# Patient Record
Sex: Female | Born: 1950 | Race: Black or African American | Hispanic: No | Smoking: Former smoker
Health system: Southern US, Community
[De-identification: ages and names within clinical notes are randomized; demographics above are authoritative.]

## PROBLEM LIST (undated history)

## (undated) DIAGNOSIS — R011 Cardiac murmur, unspecified: Secondary | ICD-10-CM

## (undated) DIAGNOSIS — E785 Hyperlipidemia, unspecified: Secondary | ICD-10-CM

## (undated) DIAGNOSIS — R251 Tremor, unspecified: Secondary | ICD-10-CM

## (undated) DIAGNOSIS — I10 Essential (primary) hypertension: Secondary | ICD-10-CM

## (undated) DIAGNOSIS — M47812 Spondylosis without myelopathy or radiculopathy, cervical region: Secondary | ICD-10-CM

## (undated) DIAGNOSIS — F32A Depression, unspecified: Secondary | ICD-10-CM

## (undated) DIAGNOSIS — F329 Major depressive disorder, single episode, unspecified: Secondary | ICD-10-CM

## (undated) DIAGNOSIS — G8929 Other chronic pain: Secondary | ICD-10-CM

## (undated) HISTORY — DX: Cardiac murmur, unspecified: R01.1

## (undated) HISTORY — DX: Depression, unspecified: F32.A

## (undated) HISTORY — DX: Spondylosis without myelopathy or radiculopathy, cervical region: M47.812

## (undated) HISTORY — DX: Major depressive disorder, single episode, unspecified: F32.9

## (undated) HISTORY — DX: Hyperlipidemia, unspecified: E78.5

## (undated) HISTORY — DX: Essential (primary) hypertension: I10

---

## 2002-06-23 ENCOUNTER — Encounter: Payer: Self-pay | Admitting: Emergency Medicine

## 2002-06-23 ENCOUNTER — Emergency Department (HOSPITAL_COMMUNITY): Admission: EM | Admit: 2002-06-23 | Discharge: 2002-06-23 | Payer: Self-pay | Admitting: Emergency Medicine

## 2002-07-03 ENCOUNTER — Encounter: Admission: RE | Admit: 2002-07-03 | Discharge: 2002-10-01 | Payer: Self-pay | Admitting: Sports Medicine

## 2002-10-02 ENCOUNTER — Encounter: Admission: RE | Admit: 2002-10-02 | Discharge: 2002-10-02 | Payer: Self-pay | Admitting: Sports Medicine

## 2004-06-10 ENCOUNTER — Ambulatory Visit: Payer: Self-pay | Admitting: Family Medicine

## 2005-12-26 ENCOUNTER — Other Ambulatory Visit: Admission: RE | Admit: 2005-12-26 | Discharge: 2005-12-26 | Payer: Self-pay | Admitting: Family Medicine

## 2006-09-08 ENCOUNTER — Emergency Department: Payer: Self-pay | Admitting: Emergency Medicine

## 2007-04-08 ENCOUNTER — Other Ambulatory Visit: Admission: RE | Admit: 2007-04-08 | Discharge: 2007-04-08 | Payer: Self-pay | Admitting: Family Medicine

## 2009-08-25 ENCOUNTER — Encounter: Admission: RE | Admit: 2009-08-25 | Discharge: 2009-09-28 | Payer: Self-pay | Admitting: Family Medicine

## 2011-07-10 DIAGNOSIS — G8921 Chronic pain due to trauma: Secondary | ICD-10-CM | POA: Diagnosis not present

## 2011-07-10 DIAGNOSIS — R030 Elevated blood-pressure reading, without diagnosis of hypertension: Secondary | ICD-10-CM | POA: Diagnosis not present

## 2012-03-12 DIAGNOSIS — L600 Ingrowing nail: Secondary | ICD-10-CM | POA: Diagnosis not present

## 2012-03-25 DIAGNOSIS — L03039 Cellulitis of unspecified toe: Secondary | ICD-10-CM | POA: Diagnosis not present

## 2012-10-25 DIAGNOSIS — B351 Tinea unguium: Secondary | ICD-10-CM | POA: Diagnosis not present

## 2012-10-25 DIAGNOSIS — B353 Tinea pedis: Secondary | ICD-10-CM | POA: Diagnosis not present

## 2013-10-23 DIAGNOSIS — G5600 Carpal tunnel syndrome, unspecified upper limb: Secondary | ICD-10-CM | POA: Diagnosis not present

## 2013-10-23 DIAGNOSIS — M5412 Radiculopathy, cervical region: Secondary | ICD-10-CM | POA: Diagnosis not present

## 2013-10-23 DIAGNOSIS — I10 Essential (primary) hypertension: Secondary | ICD-10-CM | POA: Diagnosis not present

## 2013-10-24 ENCOUNTER — Other Ambulatory Visit (HOSPITAL_COMMUNITY)
Admission: RE | Admit: 2013-10-24 | Discharge: 2013-10-24 | Disposition: A | Discharge status: Home / Self Care | Payer: Medicare Other | Source: Ambulatory Visit | Attending: Family Medicine | Admitting: Family Medicine

## 2013-10-24 ENCOUNTER — Other Ambulatory Visit: Payer: Self-pay | Admitting: Family Medicine

## 2013-10-24 DIAGNOSIS — Z780 Asymptomatic menopausal state: Secondary | ICD-10-CM | POA: Diagnosis not present

## 2013-10-24 DIAGNOSIS — Z1151 Encounter for screening for human papillomavirus (HPV): Secondary | ICD-10-CM | POA: Insufficient documentation

## 2013-10-24 DIAGNOSIS — Z1239 Encounter for other screening for malignant neoplasm of breast: Secondary | ICD-10-CM | POA: Diagnosis not present

## 2013-10-24 DIAGNOSIS — R87619 Unspecified abnormal cytological findings in specimens from cervix uteri: Secondary | ICD-10-CM | POA: Diagnosis not present

## 2013-10-24 DIAGNOSIS — Z124 Encounter for screening for malignant neoplasm of cervix: Secondary | ICD-10-CM | POA: Insufficient documentation

## 2013-10-24 DIAGNOSIS — R319 Hematuria, unspecified: Secondary | ICD-10-CM | POA: Diagnosis not present

## 2013-10-24 DIAGNOSIS — Z0000 Encounter for general adult medical examination without abnormal findings: Secondary | ICD-10-CM | POA: Diagnosis not present

## 2013-10-24 DIAGNOSIS — Z1211 Encounter for screening for malignant neoplasm of colon: Secondary | ICD-10-CM | POA: Diagnosis not present

## 2013-10-24 DIAGNOSIS — Z1231 Encounter for screening mammogram for malignant neoplasm of breast: Secondary | ICD-10-CM

## 2013-10-24 DIAGNOSIS — E2839 Other primary ovarian failure: Secondary | ICD-10-CM

## 2013-10-29 DIAGNOSIS — M25819 Other specified joint disorders, unspecified shoulder: Secondary | ICD-10-CM | POA: Diagnosis not present

## 2013-10-29 DIAGNOSIS — M5412 Radiculopathy, cervical region: Secondary | ICD-10-CM | POA: Diagnosis not present

## 2013-10-29 DIAGNOSIS — G5600 Carpal tunnel syndrome, unspecified upper limb: Secondary | ICD-10-CM | POA: Diagnosis not present

## 2013-10-29 DIAGNOSIS — M25539 Pain in unspecified wrist: Secondary | ICD-10-CM | POA: Diagnosis not present

## 2013-11-19 DIAGNOSIS — G5600 Carpal tunnel syndrome, unspecified upper limb: Secondary | ICD-10-CM | POA: Diagnosis not present

## 2013-11-19 DIAGNOSIS — M25819 Other specified joint disorders, unspecified shoulder: Secondary | ICD-10-CM | POA: Diagnosis not present

## 2013-11-19 DIAGNOSIS — M47812 Spondylosis without myelopathy or radiculopathy, cervical region: Secondary | ICD-10-CM | POA: Diagnosis not present

## 2013-11-25 ENCOUNTER — Ambulatory Visit
Admission: RE | Admit: 2013-11-25 | Discharge: 2013-11-25 | Disposition: A | Discharge status: Home / Self Care | Payer: PRIVATE HEALTH INSURANCE | Source: Ambulatory Visit | Attending: Family Medicine | Admitting: Family Medicine

## 2013-11-25 ENCOUNTER — Encounter (INDEPENDENT_AMBULATORY_CARE_PROVIDER_SITE_OTHER): Payer: Self-pay

## 2013-11-25 DIAGNOSIS — Z780 Asymptomatic menopausal state: Secondary | ICD-10-CM | POA: Diagnosis not present

## 2013-11-25 DIAGNOSIS — Z1231 Encounter for screening mammogram for malignant neoplasm of breast: Secondary | ICD-10-CM | POA: Diagnosis not present

## 2013-11-25 DIAGNOSIS — E2839 Other primary ovarian failure: Secondary | ICD-10-CM

## 2013-11-28 DIAGNOSIS — M25519 Pain in unspecified shoulder: Secondary | ICD-10-CM | POA: Diagnosis not present

## 2013-12-05 DIAGNOSIS — I10 Essential (primary) hypertension: Secondary | ICD-10-CM | POA: Diagnosis not present

## 2013-12-05 DIAGNOSIS — R079 Chest pain, unspecified: Secondary | ICD-10-CM | POA: Diagnosis not present

## 2013-12-05 DIAGNOSIS — F411 Generalized anxiety disorder: Secondary | ICD-10-CM | POA: Diagnosis not present

## 2013-12-23 ENCOUNTER — Other Ambulatory Visit: Payer: Self-pay | Admitting: Gastroenterology

## 2013-12-23 DIAGNOSIS — K648 Other hemorrhoids: Secondary | ICD-10-CM | POA: Diagnosis not present

## 2013-12-23 DIAGNOSIS — K620 Anal polyp: Secondary | ICD-10-CM | POA: Diagnosis not present

## 2013-12-23 DIAGNOSIS — K5730 Diverticulosis of large intestine without perforation or abscess without bleeding: Secondary | ICD-10-CM | POA: Diagnosis not present

## 2013-12-23 DIAGNOSIS — Z1211 Encounter for screening for malignant neoplasm of colon: Secondary | ICD-10-CM | POA: Diagnosis not present

## 2013-12-23 DIAGNOSIS — D126 Benign neoplasm of colon, unspecified: Secondary | ICD-10-CM | POA: Diagnosis not present

## 2013-12-23 DIAGNOSIS — K621 Rectal polyp: Secondary | ICD-10-CM | POA: Diagnosis not present

## 2014-01-16 DIAGNOSIS — R0789 Other chest pain: Secondary | ICD-10-CM | POA: Diagnosis not present

## 2014-01-16 DIAGNOSIS — E782 Mixed hyperlipidemia: Secondary | ICD-10-CM | POA: Diagnosis not present

## 2014-01-16 DIAGNOSIS — R9431 Abnormal electrocardiogram [ECG] [EKG]: Secondary | ICD-10-CM | POA: Diagnosis not present

## 2014-01-16 DIAGNOSIS — I10 Essential (primary) hypertension: Secondary | ICD-10-CM | POA: Diagnosis not present

## 2014-09-21 DIAGNOSIS — F329 Major depressive disorder, single episode, unspecified: Secondary | ICD-10-CM | POA: Diagnosis not present

## 2014-09-21 DIAGNOSIS — G4700 Insomnia, unspecified: Secondary | ICD-10-CM | POA: Diagnosis not present

## 2014-09-21 DIAGNOSIS — M2550 Pain in unspecified joint: Secondary | ICD-10-CM | POA: Diagnosis not present

## 2014-09-21 DIAGNOSIS — I10 Essential (primary) hypertension: Secondary | ICD-10-CM | POA: Diagnosis not present

## 2014-09-21 DIAGNOSIS — E785 Hyperlipidemia, unspecified: Secondary | ICD-10-CM | POA: Diagnosis not present

## 2014-09-21 DIAGNOSIS — Z791 Long term (current) use of non-steroidal anti-inflammatories (NSAID): Secondary | ICD-10-CM | POA: Diagnosis not present

## 2014-09-21 DIAGNOSIS — Z79899 Other long term (current) drug therapy: Secondary | ICD-10-CM | POA: Diagnosis not present

## 2014-09-22 DIAGNOSIS — Z791 Long term (current) use of non-steroidal anti-inflammatories (NSAID): Secondary | ICD-10-CM | POA: Diagnosis not present

## 2014-09-22 DIAGNOSIS — E785 Hyperlipidemia, unspecified: Secondary | ICD-10-CM | POA: Diagnosis not present

## 2014-09-22 DIAGNOSIS — I10 Essential (primary) hypertension: Secondary | ICD-10-CM | POA: Diagnosis not present

## 2014-09-22 DIAGNOSIS — Z79899 Other long term (current) drug therapy: Secondary | ICD-10-CM | POA: Diagnosis not present

## 2015-06-02 DIAGNOSIS — Z79899 Other long term (current) drug therapy: Secondary | ICD-10-CM | POA: Diagnosis not present

## 2015-06-02 DIAGNOSIS — M79641 Pain in right hand: Secondary | ICD-10-CM | POA: Diagnosis not present

## 2015-06-02 DIAGNOSIS — E785 Hyperlipidemia, unspecified: Secondary | ICD-10-CM | POA: Diagnosis not present

## 2015-06-02 DIAGNOSIS — H539 Unspecified visual disturbance: Secondary | ICD-10-CM | POA: Diagnosis not present

## 2015-06-02 DIAGNOSIS — I10 Essential (primary) hypertension: Secondary | ICD-10-CM | POA: Diagnosis not present

## 2015-06-04 DIAGNOSIS — G5603 Carpal tunnel syndrome, bilateral upper limbs: Secondary | ICD-10-CM | POA: Insufficient documentation

## 2015-06-04 DIAGNOSIS — M19031 Primary osteoarthritis, right wrist: Secondary | ICD-10-CM | POA: Diagnosis not present

## 2015-06-04 DIAGNOSIS — M1812 Unilateral primary osteoarthritis of first carpometacarpal joint, left hand: Secondary | ICD-10-CM | POA: Diagnosis not present

## 2015-06-04 DIAGNOSIS — M47812 Spondylosis without myelopathy or radiculopathy, cervical region: Secondary | ICD-10-CM | POA: Insufficient documentation

## 2015-06-04 DIAGNOSIS — M19032 Primary osteoarthritis, left wrist: Secondary | ICD-10-CM | POA: Diagnosis not present

## 2015-06-04 DIAGNOSIS — G5602 Carpal tunnel syndrome, left upper limb: Secondary | ICD-10-CM | POA: Insufficient documentation

## 2015-06-04 DIAGNOSIS — G5601 Carpal tunnel syndrome, right upper limb: Secondary | ICD-10-CM | POA: Diagnosis not present

## 2015-06-04 DIAGNOSIS — M1811 Unilateral primary osteoarthritis of first carpometacarpal joint, right hand: Secondary | ICD-10-CM | POA: Diagnosis not present

## 2015-06-10 DIAGNOSIS — H25013 Cortical age-related cataract, bilateral: Secondary | ICD-10-CM | POA: Diagnosis not present

## 2015-06-10 DIAGNOSIS — H43391 Other vitreous opacities, right eye: Secondary | ICD-10-CM | POA: Diagnosis not present

## 2015-06-10 DIAGNOSIS — H2513 Age-related nuclear cataract, bilateral: Secondary | ICD-10-CM | POA: Diagnosis not present

## 2015-06-10 DIAGNOSIS — H31002 Unspecified chorioretinal scars, left eye: Secondary | ICD-10-CM | POA: Diagnosis not present

## 2015-06-10 DIAGNOSIS — H35033 Hypertensive retinopathy, bilateral: Secondary | ICD-10-CM | POA: Diagnosis not present

## 2015-06-17 DIAGNOSIS — M1811 Unilateral primary osteoarthritis of first carpometacarpal joint, right hand: Secondary | ICD-10-CM | POA: Diagnosis not present

## 2015-06-17 DIAGNOSIS — M19032 Primary osteoarthritis, left wrist: Secondary | ICD-10-CM | POA: Diagnosis not present

## 2015-06-17 DIAGNOSIS — M19031 Primary osteoarthritis, right wrist: Secondary | ICD-10-CM | POA: Diagnosis not present

## 2015-06-17 DIAGNOSIS — M1812 Unilateral primary osteoarthritis of first carpometacarpal joint, left hand: Secondary | ICD-10-CM | POA: Diagnosis not present

## 2015-07-01 DIAGNOSIS — G5612 Other lesions of median nerve, left upper limb: Secondary | ICD-10-CM | POA: Diagnosis not present

## 2015-07-01 DIAGNOSIS — G5601 Carpal tunnel syndrome, right upper limb: Secondary | ICD-10-CM | POA: Diagnosis not present

## 2015-07-01 DIAGNOSIS — G5602 Carpal tunnel syndrome, left upper limb: Secondary | ICD-10-CM | POA: Diagnosis not present

## 2015-07-02 DIAGNOSIS — M1812 Unilateral primary osteoarthritis of first carpometacarpal joint, left hand: Secondary | ICD-10-CM | POA: Diagnosis not present

## 2015-07-07 DIAGNOSIS — M542 Cervicalgia: Secondary | ICD-10-CM | POA: Diagnosis not present

## 2015-07-27 ENCOUNTER — Ambulatory Visit (INDEPENDENT_AMBULATORY_CARE_PROVIDER_SITE_OTHER): Payer: Medicare Other | Admitting: Neurology

## 2015-07-27 ENCOUNTER — Encounter: Payer: Self-pay | Admitting: Neurology

## 2015-07-27 VITALS — BP 153/91 | HR 68 | Ht 70.0 in | Wt 260.5 lb

## 2015-07-27 DIAGNOSIS — R251 Tremor, unspecified: Secondary | ICD-10-CM | POA: Diagnosis not present

## 2015-07-27 MED ORDER — ALPRAZOLAM 0.5 MG PO TABS: ORAL_TABLET | ORAL | Status: DC

## 2015-07-27 NOTE — Progress Notes (Signed)
Reason for visit: Tremor  Referring physician: Dr. Dreama Saa is a 65 y.o. female  History of present illness:  Brooke Coleman is a 65 year old left-handed black female with a history of tremor that began in late 2015, and has gradually worsened over time. The tremor affects the left arm only, and is primarily a resting tremor. The patient indicates that the tremor does not affect her handwriting significantly or affect her ability to feed herself. The patient indicates that when she is nervous or upset about something, the tremor is worse. She denies any tremor affecting the head or neck, jaw, or the right arm or the legs. The patient denies any change in balance, no falls, and no problems with numbness or weakness of the extremities. She denies any difficulty controlling the bowels or the bladder. She denies any family history of tremor. She is concerned about the cause of the tremor. She does have a history of cervical spondylosis, she has some discomfort down the left arm. She has been set up for MRI evaluation of the cervical spine in the near future. She is sent to this office for an evaluation.  Past Medical History  Diagnosis Date  . Hypertension   . Hyperlipidemia   . Depression   . Heart murmur   . Cervical spondylosis     History reviewed. No pertinent past surgical history.  Family History  Problem Relation Age of Onset  . Pulmonary embolism Mother   . Hypertension Father   . Heart disease Father   . Lung disease Sister   . Syncope episode Sister   . Lung disease Brother   . Heart disease Brother   . Cancer - Lung Brother     Social history:  reports that she quit smoking about 10 years ago. She has never used smokeless tobacco. She reports that she does not drink alcohol or use illicit drugs.  Medications:  Prior to Admission medications   Medication Sig Start Date End Date Taking? Authorizing Provider  amLODipine (NORVASC) 5 MG tablet Take 5 mg by  mouth daily.   Yes Historical Provider, MD  aspirin 81 MG tablet Take 81 mg by mouth daily.   Yes Historical Provider, MD  ibuprofen (ADVIL,MOTRIN) 200 MG tablet Take 200 mg by mouth every 6 (six) hours as needed.   Yes Historical Provider, MD  sertraline (ZOLOFT) 100 MG tablet Take 100 mg by mouth daily.   Yes Historical Provider, MD  traZODone (DESYREL) 50 MG tablet Take 50 mg by mouth at bedtime.   Yes Historical Provider, MD      Allergies  Allergen Reactions  . Cortisone Anaphylaxis  . Erythromycin Base Anaphylaxis  . Eggs Or Egg-Derived Products     ROS:  Out of a complete 14 system review of symptoms, the patient complains only of the following symptoms, and all other reviewed systems are negative.  Constipation Joint pain, achy muscles Allergies Tremor Insomnia  Blood pressure 153/91, pulse 68, height 5\' 10"  (1.778 m), weight 260 lb 8 oz (118.162 kg).  Physical Exam  General: The patient is alert and cooperative at the time of the examination. The patient is moderately obese.  Eyes: Pupils are equal, round, and reactive to light. Discs are flat bilaterally.  Neck: The neck is supple, no carotid bruits are noted.  Respiratory: The respiratory examination is clear.  Cardiovascular: The cardiovascular examination reveals a regular rate and rhythm, no obvious murmurs or rubs are noted.  Skin: Extremities are  without significant edema.  Neurologic Exam  Mental status: The patient is alert and oriented x 3 at the time of the examination. The patient has apparent normal recent and remote memory, with an apparently normal attention span and concentration ability.  Cranial nerves: Facial symmetry is present. There is good sensation of the face to pinprick and soft touch bilaterally. The strength of the facial muscles and the muscles to head turning and shoulder shrug are normal bilaterally. Speech is well enunciated, no aphasia or dysarthria is noted, but there is some  slight reduction of amplitude of speech, and lack of inflection. Extraocular movements are full. Visual fields are full. The tongue is midline, and the patient has symmetric elevation of the soft palate. No obvious hearing deficits are noted. Mild masking of the face is seen.  Motor: The motor testing reveals 5 over 5 strength of all 4 extremities. Good symmetric motor tone is noted throughout.  Sensory: Sensory testing is intact to pinprick, soft touch, vibration sensation, and position sense on all 4 extremities. No evidence of extinction is noted.  Coordination: Cerebellar testing reveals good finger-nose-finger and heel-to-shin bilaterally.  Gait and station: Gait is normal. The patient is able to arise from a seated position with the arms crossed. With walking, she has good symmetric arm swing. Tandem gait is normal. Romberg is negative. No drift is seen.  Reflexes: Deep tendon reflexes are symmetric and normal bilaterally. Toes are downgoing bilaterally.   Assessment/Plan:  1. Resting tremor, left upper extremity  The patient does have some mild features of parkinsonism on the clinical examination, and the tremor is primarily a resting type tremor, with flexion at the MP joints of the fingers, not at the wrist. There is no evidence of an intention tremor as would be consistent with a benign essential tremor. The patient will need to be followed for developing signs of Parkinson's disease. There is not enough on the clinical examination to confirm this diagnosis, however. The patient will be set up for MRI evaluation of the brain. She will follow-up in about 5 months.  Brooke Alexanders MD 07/27/2015 2:46 PM  [facility] [address] [place], [place] [address]  Phone [phone] Fax [phone]

## 2015-07-27 NOTE — Patient Instructions (Addendum)
   We will check MRI of the brain, follow the tremor over time.  Tremor A tremor is trembling or shaking that you cannot control. Most tremors affect the hands or arms. Tremors can also affect the head, vocal cords, face, and other parts of the body. There are many types of tremors. Common types include:   Essential tremor. These usually occur in people over the age of 63. It may run in families and can happen in otherwise healthy people.   Resting tremor. These occur when the muscles are at rest, such as when your hands are resting in your lap. People with Parkinson disease often have resting tremors.   Postural tremor. These occur when you try to hold a pose, such as keeping your hands outstretched.   Kinetic tremor. These occur during purposeful movement, such as trying to touch a finger to your nose.   Task-specific tremor. These may occur when you perform tasks such as handwriting, speaking, or standing.   Psychogenic tremor. These dramatically lessen or disappear when you are distracted. They can happen in people of all ages.  Some types of tremors have no known cause. Tremors can also be a symptom of nervous system problems (neurological disorders) that may occur with aging. Some tremors go away with treatment while others do not.  HOME CARE INSTRUCTIONS Watch your tremor for any changes. The following actions may help to lessen any discomfort you are feeling:  Take medicines only as directed by your health care provider.   Limit alcohol intake to no more than 1 drink per day for nonpregnant women and 2 drinks per day for men. One drink equals 12 oz of beer, 5 oz of wine, or 1 oz of hard liquor.  Do not use any tobacco products, including cigarettes, chewing tobacco, or electronic cigarettes. If you need help quitting, ask your health care provider.   Avoid extreme heat or cold.   Limit the amount of caffeine you consumeas directed by your health care provider.    Try to get 8 hours of sleep each night.  Find ways to manage your stress, such as meditation or yoga.  Keep all follow-up visits as directed by your health care provider. This is important. SEEK MEDICAL CARE IF:  You start having a tremor after starting a new medicine.  You have tremor with other symptoms such as:  Numbness.  Tingling.  Pain.  Weakness.  Your tremor gets worse.  Your tremor interferes with your day-to-day life.   This information is not intended to replace advice given to you by your health care provider. Make sure you discuss any questions you have with your health care provider.   Document Released: 05/19/2002 Document Revised: 06/19/2014 Document Reviewed: 11/24/2013 Elsevier Interactive Patient Education [employer].

## 2015-07-29 DIAGNOSIS — M47812 Spondylosis without myelopathy or radiculopathy, cervical region: Secondary | ICD-10-CM | POA: Diagnosis not present

## 2015-07-29 DIAGNOSIS — M5021 Other cervical disc displacement,  high cervical region: Secondary | ICD-10-CM | POA: Diagnosis not present

## 2015-08-04 DIAGNOSIS — M542 Cervicalgia: Secondary | ICD-10-CM | POA: Diagnosis not present

## 2015-08-05 ENCOUNTER — Other Ambulatory Visit: Payer: Self-pay | Admitting: Neurology

## 2015-08-06 ENCOUNTER — Ambulatory Visit
Admission: RE | Admit: 2015-08-06 | Discharge: 2015-08-06 | Disposition: A | Discharge status: Home / Self Care | Payer: Medicare Other | Source: Ambulatory Visit | Attending: Neurology | Admitting: Neurology

## 2015-08-06 DIAGNOSIS — R251 Tremor, unspecified: Secondary | ICD-10-CM | POA: Diagnosis not present

## 2015-08-07 ENCOUNTER — Telehealth: Payer: Self-pay | Admitting: Neurology

## 2015-08-07 NOTE — Telephone Encounter (Signed)
  I called patient. MRI the brain shows very minimal white matter changes. The patient is on Norvasc for blood pressure, she takes low-dose aspirin. Nothing on the MRI that should be resulting in a tremor.  MRI brain 08/07/15:  IMPRESSION: This MRI of the brain without contrast shows the following: 1. Scattered T2/FLAIR hyperintense foci, predominantly in the subcortical white matter most consistent with mild chronic microvascular ischemic change. None of the changes appear to be acute. 2. Mild chronic maxillary and ethmoid sinusitis.

## 2015-09-22 DIAGNOSIS — M71342 Other bursal cyst, left hand: Secondary | ICD-10-CM | POA: Diagnosis not present

## 2015-09-22 DIAGNOSIS — M1812 Unilateral primary osteoarthritis of first carpometacarpal joint, left hand: Secondary | ICD-10-CM | POA: Diagnosis not present

## 2015-09-22 DIAGNOSIS — M1811 Unilateral primary osteoarthritis of first carpometacarpal joint, right hand: Secondary | ICD-10-CM | POA: Diagnosis not present

## 2015-09-22 DIAGNOSIS — G5602 Carpal tunnel syndrome, left upper limb: Secondary | ICD-10-CM | POA: Diagnosis not present

## 2015-09-22 DIAGNOSIS — G5601 Carpal tunnel syndrome, right upper limb: Secondary | ICD-10-CM | POA: Diagnosis not present

## 2015-10-06 DIAGNOSIS — M67432 Ganglion, left wrist: Secondary | ICD-10-CM | POA: Diagnosis not present

## 2015-10-06 DIAGNOSIS — M1811 Unilateral primary osteoarthritis of first carpometacarpal joint, right hand: Secondary | ICD-10-CM | POA: Diagnosis not present

## 2015-10-06 DIAGNOSIS — G5602 Carpal tunnel syndrome, left upper limb: Secondary | ICD-10-CM | POA: Diagnosis not present

## 2015-10-06 DIAGNOSIS — G5601 Carpal tunnel syndrome, right upper limb: Secondary | ICD-10-CM | POA: Diagnosis not present

## 2015-10-06 DIAGNOSIS — M1812 Unilateral primary osteoarthritis of first carpometacarpal joint, left hand: Secondary | ICD-10-CM | POA: Diagnosis not present

## 2016-01-05 ENCOUNTER — Ambulatory Visit (INDEPENDENT_AMBULATORY_CARE_PROVIDER_SITE_OTHER): Payer: Medicare Other | Admitting: Neurology

## 2016-01-05 ENCOUNTER — Encounter: Payer: Self-pay | Admitting: Neurology

## 2016-01-05 VITALS — BP 148/90 | HR 60 | Ht 70.0 in | Wt 251.0 lb

## 2016-01-05 DIAGNOSIS — R251 Tremor, unspecified: Secondary | ICD-10-CM

## 2016-01-05 NOTE — Patient Instructions (Addendum)
Tremor °A tremor is trembling or shaking that you cannot control. Most tremors affect the hands or arms. Tremors can also affect the head, vocal cords, face, and other parts of the body. There are many types of tremors. Common types include:  °· Essential tremor. These usually occur in people over the age of 40. It may run in families and can happen in otherwise healthy people.   °· Resting tremor. These occur when the muscles are at rest, such as when your hands are resting in your lap. People with Parkinson disease often have resting tremors.   °· Postural tremor. These occur when you try to hold a pose, such as keeping your hands outstretched.   °· Kinetic tremor. These occur during purposeful movement, such as trying to touch a finger to your nose.   °· Task-specific tremor. These may occur when you perform tasks such as handwriting, speaking, or standing.   °· Psychogenic tremor. These dramatically lessen or disappear when you are distracted. They can happen in people of all ages.   °Some types of tremors have no known cause. Tremors can also be a symptom of nervous system problems (neurological disorders) that may occur with aging. Some tremors go away with treatment while others do not.  °HOME CARE INSTRUCTIONS °Watch your tremor for any changes. The following actions may help to lessen any discomfort you are feeling: °· Take medicines only as directed by your health care provider.   °· Limit alcohol intake to no more than 1 drink per day for nonpregnant women and 2 drinks per day for men. One drink equals 12 oz of beer, 5 oz of wine, or 1½ oz of hard liquor. °· Do not use any tobacco products, including cigarettes, chewing tobacco, or electronic cigarettes. If you need help quitting, ask your health care provider.   °· Avoid extreme heat or cold.    °· Limit the amount of caffeine you consume as directed by your health care provider.   °· Try to get 8 hours of sleep each night. °· Find ways to manage your  stress, such as meditation or yoga. °· Keep all follow-up visits as directed by your health care provider. This is important. °SEEK MEDICAL CARE IF: °· You start having a tremor after starting a new medicine. °· You have tremor with other symptoms such as: °¨ Numbness. °¨ Tingling. °¨ Pain. °¨ Weakness. °· Your tremor gets worse. °· Your tremor interferes with your day-to-day life. °  °This information is not intended to replace advice given to you by your health care provider. Make sure you discuss any questions you have with your health care provider. °  °Document Released: 05/19/2002 Document Revised: 06/19/2014 Document Reviewed: 11/24/2013 °Elsevier Interactive Patient Education ©2016 Elsevier Inc. ° °

## 2016-01-05 NOTE — Progress Notes (Addendum)
Reason for visit: Tremor  Brooke Coleman is an 65 y.o. female  History of present illness:  Brooke Coleman is a 65 year old left-handed black female with a history of a tremor as a true resting tremor involving the left upper extremity only. The patient has mild masking of the face but otherwise no symptoms of parkinsonism. She has had a tremor since 2015, she denies any significant changes since last seen. The patient reports no change in balance, no difficulty getting up from a chair, no change in speech or swallowing. She has had no tremor affecting the right arm. She indicates that the tremor does not prevent her from performing any activities of daily living. She returns to this office for an evaluation.  Past Medical History:  Diagnosis Date  . Cervical spondylosis   . Depression   . Heart murmur   . Hyperlipidemia   . Hypertension     History reviewed. No pertinent surgical history.  Family History  Problem Relation Age of Onset  . Pulmonary embolism Mother   . Hypertension Father   . Heart disease Father   . Lung disease Sister   . Syncope episode Sister   . Lung disease Brother   . Heart disease Brother   . Cancer - Lung Brother     Social history:  reports that she quit smoking about 10 years ago. She has never used smokeless tobacco. She reports that she does not drink alcohol or use drugs.    Allergies  Allergen Reactions  . Cortisone Anaphylaxis  . Erythromycin Base Anaphylaxis  . Eggs Or Egg-Derived Products     Medications:  Prior to Admission medications   Medication Sig Start Date End Date Taking? Authorizing Provider  ALPRAZolam Duanne Moron) 0.5 MG tablet Take 2 tablets approximately 45 minutes prior to the MRI study, take a third tablet if needed. 07/27/15  Yes Kathrynn Ducking, MD  amLODipine (NORVASC) 5 MG tablet Take 5 mg by mouth 2 (two) times daily.    Yes Historical Provider, MD  aspirin 81 MG tablet Take 81 mg by mouth daily.   Yes Historical  Provider, MD  ibuprofen (ADVIL,MOTRIN) 200 MG tablet Take 200 mg by mouth every 6 (six) hours as needed.   Yes Historical Provider, MD  sertraline (ZOLOFT) 100 MG tablet Take 100 mg by mouth daily.   Yes Historical Provider, MD  traZODone (DESYREL) 50 MG tablet Take 50 mg by mouth at bedtime.   Yes Historical Provider, MD    ROS:  Out of a complete 14 system review of symptoms, the patient complains only of the following symptoms, and all other reviewed systems are negative.  Heart murmur Constipation Insomnia Joint pain, joint swelling, back pain, neck pain, neck stiffness Numbness, tremors  Blood pressure (!) 148/90, pulse 60, height 5\' 10"  (1.778 m), weight 251 lb (113.9 kg).  Physical Exam  General: The patient is alert and cooperative at the time of the examination.  Skin: No significant peripheral edema is noted.   Neurologic Exam  Mental status: The patient is alert and oriented x 3 at the time of the examination. The patient has apparent normal recent and remote memory, with an apparently normal attention span and concentration ability.   Cranial nerves: Facial symmetry is present. Speech is normal, no aphasia or dysarthria is noted. Extraocular movements are full. Visual fields are full. Mild masking of the face is seen.  Motor: The patient has good strength in all 4 extremities.  Sensory examination:  Soft touch sensation is symmetric on the face, arms, and legs.  Coordination: The patient has good finger-nose-finger and heel-to-shin bilaterally. A resting tremors noted with the left upper extremity.  Gait and station: The patient has a normal gait. Tandem gait is normal. Romberg is negative. No drift is seen. The patient does have a tremor affecting the left arm with walking, but there is symmetric arm swing.  Reflexes: Deep tendon reflexes are symmetric.   MRI brain 08/07/15:  IMPRESSION: This MRI of the brain without contrast shows the following: 1.  Scattered T2/FLAIR hyperintense foci, predominantly in the subcortical white matter most consistent with mild chronic microvascular ischemic change. None of the changes appear to be acute. 2. Mild chronic maxillary and ethmoid sinusitis.  * MRI scan images were reviewed online. I agree with the written report.    Assessment/Plan:  1. Resting tremor, left upper extremity  The patient does not have enough on clinical examination to diagnose Parkinson's disease. She clearly has a true resting tremor. She may try low-dose Benadryl for the tremor. She will continue to be followed over time, return in about 6 months.  Jill Alexanders MD 01/05/2016 10:24 AM  [facility] [address] [place], [place] [address]  Phone [phone] Fax [phone]

## 2016-04-06 DIAGNOSIS — R251 Tremor, unspecified: Secondary | ICD-10-CM | POA: Diagnosis not present

## 2016-04-06 DIAGNOSIS — M25511 Pain in right shoulder: Secondary | ICD-10-CM | POA: Diagnosis not present

## 2016-04-06 DIAGNOSIS — G8929 Other chronic pain: Secondary | ICD-10-CM | POA: Diagnosis not present

## 2016-04-06 DIAGNOSIS — I10 Essential (primary) hypertension: Secondary | ICD-10-CM | POA: Diagnosis not present

## 2016-04-06 DIAGNOSIS — E785 Hyperlipidemia, unspecified: Secondary | ICD-10-CM | POA: Diagnosis not present

## 2016-05-25 DIAGNOSIS — M19041 Primary osteoarthritis, right hand: Secondary | ICD-10-CM | POA: Diagnosis not present

## 2016-05-25 DIAGNOSIS — M75101 Unspecified rotator cuff tear or rupture of right shoulder, not specified as traumatic: Secondary | ICD-10-CM | POA: Diagnosis not present

## 2016-05-25 DIAGNOSIS — M19042 Primary osteoarthritis, left hand: Secondary | ICD-10-CM | POA: Diagnosis not present

## 2016-05-26 ENCOUNTER — Other Ambulatory Visit: Payer: Self-pay | Admitting: Family

## 2016-05-26 DIAGNOSIS — Z1231 Encounter for screening mammogram for malignant neoplasm of breast: Secondary | ICD-10-CM

## 2016-06-27 DIAGNOSIS — J45909 Unspecified asthma, uncomplicated: Secondary | ICD-10-CM | POA: Diagnosis not present

## 2016-06-27 DIAGNOSIS — R05 Cough: Secondary | ICD-10-CM | POA: Diagnosis not present

## 2016-06-27 DIAGNOSIS — B349 Viral infection, unspecified: Secondary | ICD-10-CM | POA: Diagnosis not present

## 2016-07-04 ENCOUNTER — Ambulatory Visit
Admission: RE | Admit: 2016-07-04 | Discharge: 2016-07-04 | Disposition: A | Discharge status: Home / Self Care | Payer: Medicare Other | Source: Ambulatory Visit | Attending: Family | Admitting: Family

## 2016-07-04 DIAGNOSIS — Z1231 Encounter for screening mammogram for malignant neoplasm of breast: Secondary | ICD-10-CM | POA: Diagnosis not present

## 2016-07-24 ENCOUNTER — Encounter: Payer: Self-pay | Admitting: Neurology

## 2016-07-24 ENCOUNTER — Ambulatory Visit (INDEPENDENT_AMBULATORY_CARE_PROVIDER_SITE_OTHER): Payer: Medicare Other | Admitting: Neurology

## 2016-07-24 VITALS — BP 174/97 | HR 84 | Ht 70.0 in | Wt 253.0 lb

## 2016-07-24 DIAGNOSIS — R251 Tremor, unspecified: Secondary | ICD-10-CM

## 2016-07-24 NOTE — Progress Notes (Signed)
Reason for visit: Tremor  Brooke Coleman is an 66 y.o. female  History of present illness:  Brooke Coleman is a 66 year old left-handed white female with a history of a true resting tremor of the left arm. The patient was seen 6 months ago, she believes there has been very little change in her tremor since that time. She denies any issues with handwriting, she sometimes will have difficulty feeding herself. There is no real involvement of the head and neck with the tremor. She denies changes in walking more difficulty getting up out of a chair. When she is under stress or she is nervous, the tremor is worse. She tried Benadryl was caused too much drowsiness during the day, she does take it at night. This will help her sleep. She denies any issues with swallowing or difficulty with falls or balance troubles.  Past Medical History:  Diagnosis Date  . Cervical spondylosis   . Depression   . Heart murmur   . Hyperlipidemia   . Hypertension     History reviewed. No pertinent surgical history.  Family History  Problem Relation Age of Onset  . Pulmonary embolism Mother   . Hypertension Father   . Heart disease Father   . Lung disease Sister   . Syncope episode Sister   . Lung disease Brother   . Heart disease Brother   . Cancer - Lung Brother     Social history:  reports that she quit smoking about 11 years ago. She has never used smokeless tobacco. She reports that she does not drink alcohol or use drugs.    Allergies  Allergen Reactions  . Cortisone Anaphylaxis  . Erythromycin Base Anaphylaxis  . Eggs Or Egg-Derived Products     Medications:  Prior to Admission medications   Medication Sig Start Date End Date Taking? Authorizing Provider  ALPRAZolam Duanne Moron) 0.5 MG tablet Take 2 tablets approximately 45 minutes prior to the MRI study, take a third tablet if needed. 07/27/15  Yes Kathrynn Ducking, MD  amLODipine (NORVASC) 5 MG tablet Take 5 mg by mouth 2 (two) times daily.     Yes Historical Provider, MD  aspirin 81 MG tablet Take 81 mg by mouth daily.   Yes Historical Provider, MD  diphenhydrAMINE (BENADRYL) 25 mg capsule Take 25 mg by mouth every 6 (six) hours as needed. Takes 1/2 tablet at bedtime. Could not take during the day because it very tired   Yes Historical Provider, MD  ibuprofen (ADVIL,MOTRIN) 200 MG tablet Take 800 mg by mouth every 6 (six) hours as needed.    Yes Historical Provider, MD  sertraline (ZOLOFT) 100 MG tablet Take 100 mg by mouth daily.   Yes Historical Provider, MD  traZODone (DESYREL) 50 MG tablet Take 50 mg by mouth at bedtime.   Yes Historical Provider, MD    ROS:  Out of a complete 14 system review of symptoms, the patient complains only of the following symptoms, and all other reviewed systems are negative.  Food allergies Insomnia, frequent waking Back pain, achy muscles, neck pain Tremors Depression, anxiety  Blood pressure (!) 174/97, pulse 84, height 5\' 10"  (1.778 m), weight 253 lb (114.8 kg).  Physical Exam  General: The patient is alert and cooperative at the time of the examination. Patient is markedly obese.  Skin: No significant peripheral edema is noted.   Neurologic Exam  Mental status: The patient is alert and oriented x 3 at the time of the examination. The patient  has apparent normal recent and remote memory, with an apparently normal attention span and concentration ability.   Cranial nerves: Facial symmetry is present. Speech is normal, no aphasia or dysarthria is noted. Extraocular movements are full. Visual fields are full. Mild masking of the face is seen.  Motor: The patient has good strength in all 4 extremities.  Sensory examination: Soft touch sensation is symmetric on the face, arms, and legs.  Coordination: The patient has good finger-nose-finger and heel-to-shin bilaterally. A resting tremor of the left upper extremity is noted.  Gait and station: The patient has a normal gait. The patient  takes good stride, she has good arm swing bilaterally, the left upper extremity tremor is noted with walking. Tandem gait is normal. Romberg is negative. No drift is seen.  Reflexes: Deep tendon reflexes are symmetric.   Assessment/Plan:  1. Left upper extremity resting tremor  The patient does not wish to go on a medication for the tremor such as propranolol or Cogentin. We will continue to follow the patient over time. She clearly has a true resting tremor, masking of the face, but with walking there is good symmetry with arm swing. The patient gives a history of a fall with head trauma with transient loss of consciousness, this could potentially be a source of onset of the tremor.  Jill Alexanders MD 07/24/2016 11:24 AM  [facility] [address] [place], [place] [address]  Phone [phone] Fax [phone]

## 2016-11-07 ENCOUNTER — Ambulatory Visit (INDEPENDENT_AMBULATORY_CARE_PROVIDER_SITE_OTHER): Payer: Medicare Other | Admitting: Family Medicine

## 2016-11-07 ENCOUNTER — Encounter: Payer: Self-pay | Admitting: Family Medicine

## 2016-11-07 VITALS — BP 140/86 | Temp 97.8°F | Ht 70.0 in | Wt 247.4 lb

## 2016-11-07 DIAGNOSIS — M549 Dorsalgia, unspecified: Secondary | ICD-10-CM | POA: Diagnosis not present

## 2016-11-07 DIAGNOSIS — Z13220 Encounter for screening for lipoid disorders: Secondary | ICD-10-CM

## 2016-11-07 DIAGNOSIS — M1812 Unilateral primary osteoarthritis of first carpometacarpal joint, left hand: Secondary | ICD-10-CM

## 2016-11-07 DIAGNOSIS — K219 Gastro-esophageal reflux disease without esophagitis: Secondary | ICD-10-CM | POA: Diagnosis not present

## 2016-11-07 DIAGNOSIS — I10 Essential (primary) hypertension: Secondary | ICD-10-CM | POA: Diagnosis not present

## 2016-11-07 DIAGNOSIS — F341 Dysthymic disorder: Secondary | ICD-10-CM | POA: Diagnosis not present

## 2016-11-07 DIAGNOSIS — M1811 Unilateral primary osteoarthritis of first carpometacarpal joint, right hand: Secondary | ICD-10-CM | POA: Diagnosis not present

## 2016-11-07 LAB — CBC WITH DIFFERENTIAL/PLATELET
Basophils Absolute: 0 10*3/uL (ref 0.0–0.1)
Basophils Relative: 0.7 % (ref 0.0–3.0)
Eosinophils Absolute: 0.2 10*3/uL (ref 0.0–0.7)
Eosinophils Relative: 4.8 % (ref 0.0–5.0)
HCT: 46.1 % — ABNORMAL HIGH (ref 36.0–46.0)
Hemoglobin: 15.4 g/dL — ABNORMAL HIGH (ref 12.0–15.0)
Lymphocytes Relative: 34 % (ref 12.0–46.0)
Lymphs Abs: 1.6 10*3/uL (ref 0.7–4.0)
MCHC: 33.3 g/dL (ref 30.0–36.0)
MCV: 92.4 fl (ref 78.0–100.0)
Monocytes Absolute: 0.3 10*3/uL (ref 0.1–1.0)
Monocytes Relative: 6.5 % (ref 3.0–12.0)
Neutro Abs: 2.5 10*3/uL (ref 1.4–7.7)
Neutrophils Relative %: 54 % (ref 43.0–77.0)
Platelets: 285 10*3/uL (ref 150.0–400.0)
RBC: 4.99 Mil/uL (ref 3.87–5.11)
RDW: 13.8 % (ref 11.5–15.5)
WBC: 4.7 10*3/uL (ref 4.0–10.5)

## 2016-11-07 LAB — COMPREHENSIVE METABOLIC PANEL
ALT: 12 U/L (ref 0–35)
AST: 18 U/L (ref 0–37)
Albumin: 4.7 g/dL (ref 3.5–5.2)
Alkaline Phosphatase: 73 U/L (ref 39–117)
BUN: 10 mg/dL (ref 6–23)
CO2: 28 mEq/L (ref 19–32)
Calcium: 9.8 mg/dL (ref 8.4–10.5)
Chloride: 104 mEq/L (ref 96–112)
Creatinine, Ser: 0.74 mg/dL (ref 0.40–1.20)
GFR: 101.73 mL/min (ref >60.00)
Glucose, Bld: 102 mg/dL — ABNORMAL HIGH (ref 70–99)
Potassium: 3.9 mEq/L (ref 3.5–5.1)
Sodium: 139 mEq/L (ref 135–145)
Total Bilirubin: 0.5 mg/dL (ref 0.2–1.2)
Total Protein: 8.4 g/dL — ABNORMAL HIGH (ref 6.0–8.3)

## 2016-11-07 LAB — LIPID PANEL
Cholesterol: 220 mg/dL — ABNORMAL HIGH (ref 0–200)
HDL: 48.8 mg/dL (ref >39.00)
LDL Cholesterol: 144 mg/dL — ABNORMAL HIGH (ref 0–99)
NonHDL: 171.03
Total CHOL/HDL Ratio: 5
Triglycerides: 133 mg/dL (ref 0.0–149.0)
VLDL: 26.6 mg/dL (ref 0.0–40.0)

## 2016-11-07 MED ORDER — DICLOFENAC SODIUM 2 % TD SOLN: 40.0000 mg | Freq: Two times a day (BID) | TRANSDERMAL | 0 refills | Status: DC

## 2016-11-07 NOTE — Patient Instructions (Signed)
Look into Turmeric as an antiinflammatory.

## 2016-11-07 NOTE — Progress Notes (Signed)
Brooke Coleman is a 66 y.o. female is here to [place].   Patient Care Team: Brooke Deutscher, DO as PCP - General (Family Medicine) Brooke Killings, MD as Consulting Physician (Orthopedic Surgery)   History of Present Illness:   Brooke Coleman CMA acting as scribe for Dr. Juleen Coleman.  Back Pain  This is a chronic problem. The current episode started more than 1 year ago. The problem occurs constantly. The problem is unchanged. The pain is present in the lumbar spine and thoracic spine. The quality of the pain is described as aching and stabbing. The pain radiates to the left thigh. The pain is at a severity of 7/10. The pain is moderate. The pain is the same all the time. The symptoms are aggravated by sitting and standing. Associated symptoms include leg pain. Pertinent negatives include no abdominal pain, chest pain, fever or headaches. She has tried NSAIDs (Accupuncture. ) for the symptoms. The treatment provided mild relief.    Patient has a history of joint pain in the first CMC joints of both hands. She previously sought treatment from orthopedics. She received injections without relief of her symptoms. She also has bilateral carpal tunnel. She has been told in the past that she would benefit from surgery. She is not interested in surgery options. She is also not interested in chronic pain management in terms of narcotics.  Health Maintenance Due  Topic Date Due  . Hepatitis C Screening  July 19, 1950  . HIV Screening  03/23/1968  . TETANUS/TDAP  03/23/1972  . COLONOSCOPY  03/24/2003  . PAP SMEAR  10/24/2016   PMHx, SurgHx, SocialHx, Medications, and Allergies were reviewed in the Visit Navigator and updated as appropriate.   Past Medical History:  Diagnosis Date  . Cervical spondylosis   . Depression   . Heart murmur   . Hyperlipidemia   . Hypertension    No past surgical history on file.  Family History  Problem Relation Age of Onset  . Pulmonary embolism Mother   .  Hypertension Father   . Heart disease Father   . Lung disease Sister   . Syncope episode Sister   . Lung disease Brother   . Heart disease Brother   . Cancer - Lung Brother    Social History  Substance Use Topics  . Smoking status: Former Smoker    Quit date: 06/12/2005  . Smokeless tobacco: Never Used  . Alcohol use No   Current Medications and Allergies:   .  albuterol (PROVENTIL HFA;VENTOLIN HFA) 108 (90 Base) MCG/ACT inhaler, Inhale 2 puffs into the lungs every 6 (six) hours as needed for wheezing or shortness of breath., Disp: , Rfl:  .  amLODipine (NORVASC) 5 MG tablet, Take 5 mg by mouth 2 (two) times daily. , Disp: , Rfl:  .  aspirin 81 MG tablet, Take 81 mg by mouth daily., Disp: , Rfl:  .  diphenhydrAMINE (BENADRYL) 25 mg capsule, Take 25 mg by mouth every 6 (six) hours as needed. Takes 1/2 tablet at bedtime. Could not take during the day because it very tired, Disp: , Rfl:  .  ibuprofen (ADVIL,MOTRIN) 200 MG tablet, Take 800 mg by mouth every 6 (six) hours as needed. , Disp: , Rfl:  .  ranitidine (ZANTAC) 150 MG tablet, Take 150 mg by mouth 2 (two) times daily., Disp: , Rfl:  .  sertraline (ZOLOFT) 100 MG tablet, Take 100 mg by mouth daily., Disp: , Rfl:   Allergies  Allergen Reactions  .  Cortisone Anaphylaxis  . Erythromycin Base Anaphylaxis  . Eggs Or Egg-Derived Products    Review of Systems:   Review of Systems  Constitutional: Negative for chills, fever and malaise/fatigue.  HENT: Negative for ear pain, sinus pain and sore throat.   Eyes: Negative for blurred vision and double vision.  Respiratory: Negative for cough, shortness of breath and wheezing.   Cardiovascular: Negative for chest pain, palpitations and leg swelling.  Gastrointestinal: Negative for abdominal pain, nausea and vomiting.  Musculoskeletal: Positive for back pain, joint pain and neck pain.       Chronic.   Neurological: Negative for dizziness and headaches.  Psychiatric/Behavioral: Positive  for depression. Negative for hallucinations and memory loss.   Vitals:   Vitals:   11/07/16 1050  BP: 140/86  Temp: 97.8 F (36.6 C)  TempSrc: Oral  Weight: 247 lb 6.4 oz (112.2 kg)  Height: 5\' 10"  (1.778 m)     Body mass index is 35.5 kg/m.  Physical Exam:   Physical Exam  Constitutional: She appears well-nourished.  HENT:  Head: Normocephalic and atraumatic.  Eyes: EOM are normal. Pupils are equal, round, and reactive to light.  Neck: Normal range of motion. Neck supple.  Cardiovascular: Normal rate, regular rhythm, normal heart sounds and intact distal pulses.   Pulmonary/Chest: Effort normal.  Abdominal: Soft.  Musculoskeletal:  Positive Tinel and Phalen bilateral wrists. Positive tenderness to bilateral first CMC joints of both hands. Neck with limited range of motion.  Skin: Skin is warm.  Psychiatric: She has a normal mood and affect. Her behavior is normal.  Nursing note and vitals reviewed.   Assessment and Plan:   Lyah was seen today for establish care.  Diagnoses and all orders for this visit:  Back pain, unspecified back location, unspecified back pain laterality, unspecified chronicity Comments: The patient staying active. She is not interested in medications orally. We discussed Turmeric as a supplement.  Gastroesophageal reflux disease without esophagitis Comments: Well controlled.  No signs of complications, medication side effects, or red flags.  Continue current regimen.   Orders: -     CBC with Differential/Platelet  Essential hypertension Comments: Well controlled.  No signs of complications, medication side effects, or red flags.  Continue current regimen.   Orders: -     Comprehensive metabolic panel -     Lipid panel  Screening for lipid disorders -     Lipid panel  Primary osteoarthritis of first carpometacarpal joint of right hand -     Diclofenac Sodium 2 % SOLN; Place 40 mg onto the skin 2 (two) times daily.  Primary  osteoarthritis of first carpometacarpal joint of left hand -     Diclofenac Sodium 2 % SOLN; Place 40 mg onto the skin 2 (two) times daily.    . Reviewed expectations re: course of current medical issues. . Discussed self-management of symptoms. . Outlined signs and symptoms indicating need for more acute intervention. . Patient verbalized understanding and all questions were answered. Marland Kitchen Health Maintenance issues including appropriate healthy diet, exercise, and smoking avoidance were discussed with patient. . See orders for this visit as documented in the electronic medical record. . Patient received an After Visit Summary.  CMA served as Education administrator during this visit. History, Physical, and Plan performed by medical provider. The above documentation has been reviewed and is accurate and complete. Brooke Coleman, D.O.  Brooke Deutscher, DO [facility] 11/07/2016  Future Appointments Date Time Provider [place]  01/24/2017 10:30 AM  Kathrynn Ducking, MD [facility] None  05/08/2017 11:30 AM Brooke Deutscher, DO [facility] None

## 2017-01-24 ENCOUNTER — Encounter: Payer: Self-pay | Admitting: Neurology

## 2017-01-24 ENCOUNTER — Ambulatory Visit (INDEPENDENT_AMBULATORY_CARE_PROVIDER_SITE_OTHER): Payer: Medicare Other | Admitting: Neurology

## 2017-01-24 ENCOUNTER — Encounter (INDEPENDENT_AMBULATORY_CARE_PROVIDER_SITE_OTHER): Payer: Self-pay

## 2017-01-24 VITALS — BP 154/94 | HR 80 | Ht 70.0 in | Wt 249.0 lb

## 2017-01-24 DIAGNOSIS — R251 Tremor, unspecified: Secondary | ICD-10-CM | POA: Diagnosis not present

## 2017-01-24 DIAGNOSIS — F5104 Psychophysiologic insomnia: Secondary | ICD-10-CM

## 2017-01-24 NOTE — Progress Notes (Signed)
Reason for visit: Resting tremor  Brooke Coleman is an 66 y.o. female  History of present illness:  Brooke Coleman is a 66 year old left-handed black female with a history of a resting tremor involving the left upper extremity. The patient also has a side-to-side head tremor. The patient has masking of the face, she is being followed for developing parkinsonism. The patient has had increased tremor due to stress associated with the recent death of her brother from pulmonary embolism in June 2018. The patient is using Benadryl for sleep, 25 mg tablet makes her sleep well, but she may wake up in the middle of the night and cannot get back to sleep. She feels fatigued during the day. She also has bilateral carpal tunnel syndrome and has seen Dr. Fredna Dow for this. She is having increased discomfort on the right hand, she has not been back to see her hand surgeon yet. The patient returns to this office for an evaluation. Overall, she feels that her functional level has not changed. She denies any issues with balance or difficulty walking.  Past Medical History:  Diagnosis Date  . Cervical spondylosis   . Depression   . Heart murmur   . Hyperlipidemia   . Hypertension     History reviewed. No pertinent surgical history.  Family History  Problem Relation Age of Onset  . Pulmonary embolism Mother   . Hypertension Father   . Heart disease Father   . Lung disease Sister   . Syncope episode Sister   . Lung disease Brother   . Heart disease Brother   . Cancer - Lung Brother     Social history:  reports that she quit smoking about 11 years ago. She has never used smokeless tobacco. She reports that she does not drink alcohol or use drugs.    Allergies  Allergen Reactions  . Cortisone Anaphylaxis  . Erythromycin Base Anaphylaxis  . Neurontin [Gabapentin] Swelling    Lower extremity edema  . Eggs Or Egg-Derived Products   . Flexeril [Cyclobenzaprine]     "pinching in chest"  . Naproxen       "pinching in chest"    Medications:  Prior to Admission medications   Medication Sig Start Date End Date Taking? Authorizing Provider  albuterol (PROVENTIL HFA;VENTOLIN HFA) 108 (90 Base) MCG/ACT inhaler Inhale 2 puffs into the lungs every 6 (six) hours as needed for wheezing or shortness of breath.   Yes [provider]  amLODipine (NORVASC) 5 MG tablet Take 5 mg by mouth 2 (two) times daily.    Yes [provider]  aspirin 81 MG tablet Take 81 mg by mouth daily.   Yes [provider]  Diclofenac Sodium 2 % SOLN Place 40 mg onto the skin 2 (two) times daily. 11/07/16  Yes Briscoe Deutscher, DO  diphenhydrAMINE (BENADRYL) 25 mg capsule Take 25 mg by mouth every 6 (six) hours as needed. Takes 1/2 tablet at bedtime. Could not take during the day because it very tired   Yes [provider]  ibuprofen (ADVIL,MOTRIN) 200 MG tablet Take 800 mg by mouth every 6 (six) hours as needed.    Yes [provider]  ranitidine (ZANTAC) 150 MG tablet Take 150 mg by mouth 2 (two) times daily.   Yes [provider]  sertraline (ZOLOFT) 100 MG tablet Take 100 mg by mouth daily.   Yes [provider]    ROS:  Out of a complete 14 system review of symptoms, the  patient complains only of the following symptoms, and all other reviewed systems are negative.  Heart murmur Insomnia Food allergies Joint pain, back pain, achy muscles, neck pain, neck stiffness Tremors Depression, anxiety    HBlood pressure (!) 154/94, pulse 80, height 5\' 10"  (1.778 m), weight 249 lb (112.9 kg).  Physical Exam  General: The patient is alert and cooperative at the time of the examination. The patient is moderately to markedly obese.  Skin: No significant peripheral edema is noted.   Neurologic Exam  Mental status: The patient is alert and oriented x 3 at the time of the examination. The patient has apparent normal recent and remote memory, with an apparently  normal attention span and concentration ability.   Cranial nerves: Facial symmetry is present. Speech is normal, no aphasia or dysarthria is noted. Extraocular movements are full. Visual fields are full. Masking of the face is seen. A side-to-side head tremor is noted.  Motor: The patient has good strength in all 4 extremities.  Sensory examination: Soft touch sensation is symmetric on the face, arms, and legs.  Coordination: The patient has good finger-nose-finger and heel-to-shin bilaterally. A resting tremors noted with the left upper extremity.  Gait and station: The patient has a normal gait, the patient has good arm swing with walking, the resting tremor is noted on the left hand with walking. The patient is able to arise from a seated position with arms crossed. Tandem gait is normal. Romberg is negative. No drift is seen.  Reflexes: Deep tendon reflexes are symmetric.   Assessment/Plan:  1. Left upper extremity resting tremor  2. Bilateral carpal tunnel syndrome  3. 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 She is having increased discomfort in her right hand associated with carpal tunnel syndrome, she is to see Dr. Fredna Dow about this. She will increase her Benadryl at night to 50 mg to help her sleep.   Jill Alexanders MD 01/24/2017 10:34 AM  [facility] [address] [employer] [place], [place] [address]  Phone [phone] Fax [phone]

## 2017-05-08 ENCOUNTER — Other Ambulatory Visit (HOSPITAL_COMMUNITY)
Admission: RE | Admit: 2017-05-08 | Discharge: 2017-05-08 | Disposition: A | Discharge status: Home / Self Care | Payer: Medicare Other | Source: Ambulatory Visit | Attending: Family Medicine | Admitting: Family Medicine

## 2017-05-08 ENCOUNTER — Ambulatory Visit (INDEPENDENT_AMBULATORY_CARE_PROVIDER_SITE_OTHER): Payer: Medicare Other | Admitting: Family Medicine

## 2017-05-08 ENCOUNTER — Encounter: Payer: Self-pay | Admitting: Family Medicine

## 2017-05-08 VITALS — BP 146/84 | HR 67 | Temp 98.0°F | Ht 70.0 in | Wt 247.0 lb

## 2017-05-08 DIAGNOSIS — R739 Hyperglycemia, unspecified: Secondary | ICD-10-CM | POA: Diagnosis not present

## 2017-05-08 DIAGNOSIS — I10 Essential (primary) hypertension: Secondary | ICD-10-CM | POA: Diagnosis not present

## 2017-05-08 DIAGNOSIS — Z124 Encounter for screening for malignant neoplasm of cervix: Secondary | ICD-10-CM | POA: Insufficient documentation

## 2017-05-08 DIAGNOSIS — M47812 Spondylosis without myelopathy or radiculopathy, cervical region: Secondary | ICD-10-CM | POA: Diagnosis not present

## 2017-05-08 DIAGNOSIS — F341 Dysthymic disorder: Secondary | ICD-10-CM

## 2017-05-08 LAB — POCT GLYCOSYLATED HEMOGLOBIN (HGB A1C): Hemoglobin A1C: 5.1

## 2017-05-08 MED ORDER — SERTRALINE HCL 100 MG PO TABS: 100.0000 mg | ORAL_TABLET | Freq: Every day | ORAL | 1 refills | Status: DC

## 2017-05-08 MED ORDER — IBUPROFEN 800 MG PO TABS: 800.0000 mg | ORAL_TABLET | Freq: Three times a day (TID) | ORAL | 2 refills | Status: DC

## 2017-05-08 MED ORDER — AMLODIPINE BESYLATE 5 MG PO TABS: 5.0000 mg | ORAL_TABLET | Freq: Two times a day (BID) | ORAL | 1 refills | Status: DC

## 2017-05-08 NOTE — Progress Notes (Signed)
Brooke Coleman is a 66 y.o. female is here for follow up.  History of Present Illness:   HPI:   1. Essential hypertension.   Avoiding excessive salt intake? [x]   YES  []   NO Trying to exercise on a regular basis? [x]   YES  []   NO Review: taking medications as instructed, no medication side effects noted, no TIAs, no chest pain on exertion, no dyspnea on exertion, no swelling of ankles.   Wt Readings from Last 3 Encounters:  05/08/17 247 lb (112 kg)  01/24/17 249 lb (112.9 kg)  11/07/16 247 lb 6.4 oz (112.2 kg)   Reports that she quit smoking about 11 years ago. she has never used smokeless tobacco.  BP Readings from Last 3 Encounters:  05/08/17 (!) 146/84  01/24/17 (!) 154/94  11/07/16 140/86   Lab Results  Component Value Date   CREATININE 0.74 11/07/2016    2. Dysthymia.   Depression symptoms: depressed mood She has not been taking her Zoloft - ran out one month ago.  Alcohol use: no.  Drug use: no.  Exercise: minimal.   Patient denies current suicidal and homicidal ideation.   3. Cervical spondylosis without myelopathy. With chronic pain. No UE weakness. Requests Rx of Motrin to use prn.     Health Maintenance Due  Topic Date Due  . Hepatitis C Screening  04-23-1951  . HIV Screening  03/23/1968  . TETANUS/TDAP  03/23/1972  . PAP SMEAR  10/24/2016  . INFLUENZA VACCINE  01/10/2017   Depression screen [facility] 2/9 05/08/2017 11/07/2016  Decreased Interest 3 1  Down, Depressed, Hopeless 2 0  PHQ - 2 Score 5 1  Altered sleeping 3 3  Tired, decreased energy 3 1  Change in appetite 1 0  Feeling bad or failure about yourself  0 0  Trouble concentrating 2 1  Moving slowly or fidgety/restless 0 0  Suicidal thoughts 0 0  PHQ-9 Score 14 6  Difficult doing work/chores Very difficult -   PMHx, SurgHx, SocialHx, FamHx, Medications, and Allergies were reviewed in the Visit Navigator and updated as appropriate.   Patient Active Problem List   Diagnosis Date Noted  .  Chronic insomnia 01/24/2017  . Gastroesophageal reflux disease without esophagitis 11/07/2016  . Essential hypertension 11/07/2016  . Dysthymia 11/07/2016  . Tremor 07/27/2015  . Carpal tunnel syndrome on left 06/04/2015  . Carpal tunnel syndrome on right 06/04/2015  . Cervical spondylosis without myelopathy 06/04/2015  . Primary osteoarthritis of first carpometacarpal joint of left hand 06/04/2015  . Primary osteoarthritis of first carpometacarpal joint of right hand 06/04/2015  . 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 Primary osteoarthritis of right wrist 06/04/2015   Social History   Tobacco Use  . Smoking status: Former Smoker    Last attempt to quit: 06/12/2005    Years since quitting: 11.9  . Smokeless tobacco: Never Used  Substance Use Topics  . Alcohol use: No  . Drug use: No   Current Medications and Allergies:   .  albuterol (PROVENTIL HFA;VENTOLIN HFA) 108 (90 Base) MCG/ACT inhaler, Inhale 2 puffs into the lungs every 6 (six) hours as needed for wheezing or shortness of breath., Disp: , Rfl:  .  amLODipine (NORVASC) 5 MG tablet, Take 5 mg by mouth 2 (two) times daily. , Disp: , Rfl:  .  aspirin 81 MG tablet, Take 81 mg by mouth daily., Disp: , Rfl:  .  Diclofenac Sodium 2 % SOLN, Place 40 mg onto the  skin 2 (two) times daily., Disp: 2 g, Rfl: 0 .  diphenhydrAMINE (BENADRYL) 25 mg capsule, Take 25 mg by mouth every 6 (six) hours as needed. Takes 1/2 tablet at bedtime. Could not take during the day because it very tired, Disp: , Rfl:  .  ibuprofen (ADVIL,MOTRIN) 200 MG tablet, Take 800 mg by mouth every 6 (six) hours as needed. , Disp: , Rfl:  .  ranitidine (ZANTAC) 150 MG tablet, Take 150 mg by mouth 2 (two) times daily., Disp: , Rfl:  .  sertraline (ZOLOFT) 100 MG tablet, Take 100 mg by mouth daily., Disp: , Rfl:    Allergies  Allergen Reactions  . Cortisone Anaphylaxis  . Erythromycin Base Anaphylaxis  . Neurontin [Gabapentin] Swelling  . Eggs Or  Egg-Derived Products   . Flexeril [Cyclobenzaprine]     "pinching in chest"  . Naproxen     "pinching in chest"   Review of Systems   Pertinent items are noted in the HPI. Otherwise, ROS is negative.  Vitals:   Vitals:   05/08/17 1124  BP: (!) 146/84  Pulse: 67  Temp: 98 F (36.7 C)  TempSrc: Oral  SpO2: 97%  Weight: 247 lb (112 kg)  Height: 5\' 10"  (1.778 m)     Body mass index is 35.44 kg/m.   Physical Exam:   Physical Exam  Constitutional: She appears well-nourished.  HENT:  Head: Normocephalic and atraumatic.  Eyes: EOM are normal. Pupils are equal, round, and reactive to light.  Neck: Normal range of motion. Neck supple.  Cardiovascular: Normal rate, regular rhythm, normal heart sounds and intact distal pulses.  Pulmonary/Chest: Effort normal.  Abdominal: Soft.  Skin: Skin is warm.  Psychiatric: She has a normal mood and affect. Her behavior is normal.  Nursing note and vitals reviewed.   BP (!) 146/84   Pulse 67   Temp 98 F (36.7 C) (Oral)   Ht 5\' 10"  (1.778 m)   Wt 247 lb (112 kg)   SpO2 97%   BMI 35.44 kg/m   Wt Readings from Last 3 Encounters:  05/08/17 247 lb (112 kg)  01/24/17 249 lb (112.9 kg)  11/07/16 247 lb 6.4 oz (112.2 kg)     Ht Readings from Last 3 Encounters:  05/08/17 5\' 10"  (1.778 m)  01/24/17 5\' 10"  (1.778 m)  11/07/16 5\' 10"  (1.778 m)    General appearance: alert, cooperative and appears stated age. Head: normocephalic, without obvious abnormality, atraumatic. Neck: no adenopathy, supple, symmetrical, trachea midline; thyroid not enlarged, symmetric, no tenderness/mass/nodules. Lungs: clear to auscultation bilaterally. Breasts: inspection negative, no nipple retraction or dimpling, no nipple discharge or bleeding, no axillary or supraclavicular adenopathy, normal to palpation without dominant masses. Heart: regular rate and rhythm Abdomen: soft, non-tender; no masses,  no organomegaly. Extremities: extremities normal,  atraumatic, no cyanosis or edema. Skin: skin color, texture, turgor normal, no rashes or lesions. Lymph: cervical, supraclavicular, and axillary nodes normal; no abnormal inguinal nodes palpated. Neurologic: grossly normal.  Pelvic:  External genitalia: no lesions.              Urethra: normal appearing urethra with no masses, tenderness or lesions.              Bartholins and Skenes: normal.               Vagina: normal appearing vagina with normal color and discharge, no lesions.              Cervix: normal appearance.  Pap and high risk HPV testing done: Yes.          Bimanual Exam:   Uterus: uterus is normal size, shape, consistency and nontender.                                      Adnexa: normal adnexa in size, nontender and no masses.                        Results for orders placed or performed in visit on 05/08/17  POCT glycosylated hemoglobin (Hb A1C)  Result Value Ref Range   Hemoglobin A1C 5.1   Cytology - PAP  Result Value Ref Range   Adequacy      Satisfactory for evaluation  endocervical/transformation zone component PRESENT.   Diagnosis      NEGATIVE FOR INTRAEPITHELIAL LESIONS OR MALIGNANCY.   HPV NOT DETECTED    Material Submitted CervicoVaginal Pap [ThinPrep Imaged]    CYTOLOGY - PAP PAP RESULT    Assessment and Plan:   Brooke Coleman was seen today for follow-up.  Diagnoses and all orders for this visit:  Hyperglycemia Comments: Normal A1c.  Orders: -     POCT glycosylated hemoglobin (Hb A1C)  Pap smear for cervical cancer screening -     Cytology - PAP  Essential hypertension Comments: Well controlled.  No signs of complications, medication side effects, or red flags.  Continue current regimen.   Orders: -     amLODipine (NORVASC) 5 MG tablet; Take 1 tablet (5 mg total) by mouth 2 (two) times daily.  Dysthymia Comments: Patient has been off of medication for > 1 month. Will restart.  Orders: -     sertraline (ZOLOFT) 100 MG tablet; Take  1 tablet (100 mg total) by mouth daily.  Cervical spondylosis without myelopathy -     ibuprofen (ADVIL,MOTRIN) 800 MG tablet; Take 1 tablet (800 mg total) by mouth 3 (three) times daily.    . Reviewed expectations re: course of current medical issues. . Discussed self-management of symptoms. . Outlined signs and symptoms indicating need for more acute intervention. . Patient verbalized understanding and all questions were answered. Marland Kitchen Health Maintenance issues including appropriate healthy diet, exercise, and smoking avoidance were discussed with patient. . See orders for this visit as documented in the electronic medical record. . Patient received an After Visit Summary.  Briscoe Deutscher, DO , [facility] 05/09/2017  Future Appointments  Date Time Provider [place]  07/27/2017 11:00 AM Kathrynn Ducking, MD [facility] None

## 2017-05-09 ENCOUNTER — Encounter: Payer: Self-pay | Admitting: Family Medicine

## 2017-05-09 LAB — CYTOLOGY - PAP
Diagnosis: NEGATIVE
HPV: NOT DETECTED

## 2017-07-27 ENCOUNTER — Encounter: Payer: Self-pay | Admitting: Neurology

## 2017-07-27 ENCOUNTER — Encounter (INDEPENDENT_AMBULATORY_CARE_PROVIDER_SITE_OTHER): Payer: Self-pay

## 2017-07-27 ENCOUNTER — Ambulatory Visit (INDEPENDENT_AMBULATORY_CARE_PROVIDER_SITE_OTHER): Payer: Medicare Other | Admitting: Neurology

## 2017-07-27 VITALS — BP 154/95 | HR 78 | Wt 250.0 lb

## 2017-07-27 DIAGNOSIS — R251 Tremor, unspecified: Secondary | ICD-10-CM

## 2017-07-27 NOTE — Progress Notes (Signed)
Reason for visit: Tremor  Brooke Coleman is an 67 y.o. female  History of present illness:  Ms. Brooke Coleman is a 67 year old left-handed black female with a history of a tremor that has been present since 2014.  The patient claims that she did have a concussion with loss of consciousness in 2012, she has no family history of tremor.  She does have features of parkinsonism, but she has not developed a full syndrome of Parkinson's disease.  The tremors affect both upper extremities, left greater than right with primarily a resting component, but the patient however does note that she occasionally may have some difficulty feeding herself, she denies any significant issue with handwriting.  She will note a side-to-side head tremor at times.  Being nervous or upset about something will increase the tremor.  She denies any difficulty with swallowing or choking.  She denies any troubles with balance, she has not had any falls.  She does have some low back pain and right leg pain at times.  She comes to this office for further evaluation.  She takes Benadryl off and on for sleep  Past Medical History:  Diagnosis Date  . Cervical spondylosis   . Depression   . Heart murmur   . Hyperlipidemia   . Hypertension     History reviewed. No pertinent surgical history.  Family History  Problem Relation Age of Onset  . Pulmonary embolism Mother   . Hypertension Father   . Heart disease Father   . Lung disease Sister   . Syncope episode Sister   . Lung disease Brother   . Heart disease Brother   . Cancer - Lung Brother     Social history:  reports that she quit smoking about 12 years ago. she has never used smokeless tobacco. She reports that she does not drink alcohol or use drugs.    Allergies  Allergen Reactions  . Cortisone Anaphylaxis  . Erythromycin Base Anaphylaxis  . Neurontin [Gabapentin] Swelling  . Eggs Or Egg-Derived Products   . Flexeril [Cyclobenzaprine]     "pinching in chest"    . Naproxen     "pinching in chest"    Medications:  Prior to Admission medications   Medication Sig Start Date End Date Taking? Authorizing Provider  albuterol (PROVENTIL HFA;VENTOLIN HFA) 108 (90 Base) MCG/ACT inhaler Inhale 2 puffs into the lungs every 6 (six) hours as needed for wheezing or shortness of breath.   Yes [provider]  amLODipine (NORVASC) 5 MG tablet Take 1 tablet (5 mg total) by mouth 2 (two) times daily. 05/08/17  Yes Briscoe Deutscher, DO  aspirin 81 MG tablet Take 81 mg by mouth daily.   Yes [provider]  diphenhydrAMINE (BENADRYL) 25 mg capsule Take 50 mg by mouth at bedtime. Takes 1/2 tablet at bedtime. Could not take during the day because it very tired    Yes [provider]  ibuprofen (ADVIL,MOTRIN) 200 MG tablet Take 800 mg by mouth every 6 (six) hours as needed.    Yes [provider]  ranitidine (ZANTAC) 150 MG tablet Take 150 mg by mouth 2 (two) times daily.   Yes [provider]  sertraline (ZOLOFT) 100 MG tablet Take 1 tablet (100 mg total) by mouth daily. 05/08/17  Yes Briscoe Deutscher, DO    ROS:  Out of a complete 14 system review of symptoms, the patient complains only of the following symptoms, and all other reviewed systems are negative.  Tremor Low  back pain  Blood pressure (!) 154/95, pulse 78, weight 250 lb (113.4 kg).  Physical Exam  General: The patient is alert and cooperative at the time of the examination.  Skin: No significant peripheral edema is noted.   Neurologic Exam  Mental status: The patient is alert and oriented x 3 at the time of the examination. The patient has apparent normal recent and remote memory, with an apparently normal attention span and concentration ability.   Cranial nerves: Facial symmetry is present. Speech is normal, no aphasia or dysarthria is noted. Extraocular movements are full. Visual fields are full.  Mild masking of the face is seen.  Motor: The patient  has good strength in all 4 extremities.  Sensory examination: Soft touch sensation is symmetric on the face, arms, and legs.  Coordination: The patient has good finger-nose-finger and heel-to-shin bilaterally.  Resting tremors are seen with both upper extremities, left greater than right.  The patient does have some minimal tremor with finger-nose-finger.  No tremors are seen with the lower extremities.  With drawing a spiral, there is minimal tremor translated into handwriting.  Gait and station: The patient has a normal gait.  The patient is able to rise from a seated position with arms crossed.  With walking, the patient has excellent bilaterally symmetric arm swing.  Tandem gait is normal. Romberg is negative. No drift is seen.  Reflexes: Deep tendon reflexes are symmetric.   Assessment/Plan:  1.  Upper extremity tremors  The patient does have features of parkinsonism, it is not clear that she has Parkinson's disease, she could have an essential tremor.  Occasional individuals with essential tremors may have features of parkinsonism.  The patient does not wish to go on any medications for the tremor.  We will continue to follow the patient conservatively.  She will follow-up in 6 months.  Jill Alexanders MD 07/27/2017 11:00 AM  [facility] [address] [place], [place] [address]  Phone [phone] Fax [phone]

## 2017-09-05 ENCOUNTER — Other Ambulatory Visit: Payer: Self-pay

## 2017-09-05 ENCOUNTER — Other Ambulatory Visit: Payer: Self-pay | Admitting: Family Medicine

## 2017-09-05 DIAGNOSIS — I10 Essential (primary) hypertension: Secondary | ICD-10-CM

## 2017-09-05 MED ORDER — AMLODIPINE BESYLATE 5 MG PO TABS: 5.0000 mg | ORAL_TABLET | Freq: Two times a day (BID) | ORAL | 0 refills | Status: DC

## 2017-10-02 ENCOUNTER — Emergency Department (HOSPITAL_COMMUNITY): Payer: Medicare Other

## 2017-10-02 ENCOUNTER — Emergency Department (HOSPITAL_COMMUNITY)
Admission: EM | Admit: 2017-10-02 | Discharge: 2017-10-03 | Disposition: A | Discharge status: Home / Self Care | Payer: Medicare Other | Attending: Emergency Medicine | Admitting: Emergency Medicine

## 2017-10-02 ENCOUNTER — Encounter (HOSPITAL_COMMUNITY): Payer: Self-pay

## 2017-10-02 DIAGNOSIS — R0789 Other chest pain: Secondary | ICD-10-CM

## 2017-10-02 DIAGNOSIS — Z79899 Other long term (current) drug therapy: Secondary | ICD-10-CM | POA: Diagnosis not present

## 2017-10-02 DIAGNOSIS — Z7982 Long term (current) use of aspirin: Secondary | ICD-10-CM | POA: Insufficient documentation

## 2017-10-02 DIAGNOSIS — M25512 Pain in left shoulder: Secondary | ICD-10-CM | POA: Diagnosis not present

## 2017-10-02 DIAGNOSIS — R0602 Shortness of breath: Secondary | ICD-10-CM | POA: Diagnosis not present

## 2017-10-02 DIAGNOSIS — I10 Essential (primary) hypertension: Secondary | ICD-10-CM | POA: Diagnosis not present

## 2017-10-02 DIAGNOSIS — R079 Chest pain, unspecified: Secondary | ICD-10-CM | POA: Diagnosis not present

## 2017-10-02 DIAGNOSIS — Z87891 Personal history of nicotine dependence: Secondary | ICD-10-CM | POA: Diagnosis not present

## 2017-10-02 DIAGNOSIS — M79602 Pain in left arm: Secondary | ICD-10-CM | POA: Diagnosis present

## 2017-10-02 HISTORY — DX: Tremor, unspecified: R25.1

## 2017-10-02 HISTORY — DX: Other chronic pain: G89.29

## 2017-10-02 LAB — BASIC METABOLIC PANEL
Anion gap: 12 (ref 5–15)
BUN: 7 mg/dL (ref 6–20)
CO2: 20 mmol/L — ABNORMAL LOW (ref 22–32)
Calcium: 9.2 mg/dL (ref 8.9–10.3)
Chloride: 107 mmol/L (ref 101–111)
Creatinine, Ser: 0.63 mg/dL (ref 0.44–1.00)
GFR calc Af Amer: >60 mL/min (ref >60)
GLUCOSE: 110 mg/dL — AB (ref 65–99)
POTASSIUM: 3.3 mmol/L — AB (ref 3.5–5.1)
SODIUM: 139 mmol/L (ref 135–145)

## 2017-10-02 LAB — CBC
HEMATOCRIT: 42.7 % (ref 36.0–46.0)
Hemoglobin: 14.6 g/dL (ref 12.0–15.0)
MCH: 31.6 pg (ref 26.0–34.0)
MCHC: 34.2 g/dL (ref 30.0–36.0)
MCV: 92.4 fL (ref 78.0–100.0)
Platelets: 290 10*3/uL (ref 150–400)
RBC: 4.62 MIL/uL (ref 3.87–5.11)
RDW: 13.6 % (ref 11.5–15.5)
WBC: 5.8 10*3/uL (ref 4.0–10.5)

## 2017-10-02 LAB — I-STAT TROPONIN, ED
TROPONIN I, POC: 0 ng/mL (ref 0.00–0.08)
TROPONIN I, POC: 0 ng/mL (ref 0.00–0.08)

## 2017-10-02 NOTE — ED Triage Notes (Signed)
Onset several weeks left arm pain and weakness.  Pt is left handed and states that she has difficulty lifting things.  Onset today mid chest pain.  Worsened when lifting some things with left hand.  No shortness of breath, nausea, diaphoresis or any other s/s.

## 2017-10-03 DIAGNOSIS — R0789 Other chest pain: Secondary | ICD-10-CM | POA: Diagnosis not present

## 2017-10-03 DIAGNOSIS — M25512 Pain in left shoulder: Secondary | ICD-10-CM | POA: Diagnosis not present

## 2017-10-03 MED ORDER — HYDROCODONE-ACETAMINOPHEN 5-325 MG PO TABS: 2.0000 | ORAL_TABLET | Freq: Four times a day (QID) | ORAL | 0 refills | Status: DC | PRN

## 2017-10-03 MED ORDER — IBUPROFEN 800 MG PO TABS: 800.0000 mg | ORAL_TABLET | Freq: Three times a day (TID) | ORAL | 0 refills | Status: DC | PRN

## 2017-10-03 MED ORDER — ONDANSETRON 4 MG PO TBDP
4.0000 mg | ORAL_TABLET | Freq: Once | ORAL | Status: AC
  Administered 2017-10-03: 4 mg via ORAL
  Filled 2017-10-03: qty 1

## 2017-10-03 MED ORDER — FENTANYL CITRATE (PF) 100 MCG/2ML IJ SOLN: 50.0000 ug | Freq: Once | INTRAMUSCULAR | Status: DC

## 2017-10-03 MED ORDER — ONDANSETRON HCL 4 MG/2ML IJ SOLN: 4.0000 mg | Freq: Once | INTRAMUSCULAR | Status: DC

## 2017-10-03 MED ORDER — HYDROCODONE-ACETAMINOPHEN 5-325 MG PO TABS
2.0000 | ORAL_TABLET | Freq: Once | ORAL | Status: AC
  Administered 2017-10-03: 2 via ORAL
  Filled 2017-10-03: qty 2

## 2017-10-03 MED ORDER — IBUPROFEN 800 MG PO TABS
800.0000 mg | ORAL_TABLET | Freq: Once | ORAL | Status: AC
  Administered 2017-10-03: 800 mg via ORAL
  Filled 2017-10-03: qty 1

## 2017-10-03 NOTE — ED Notes (Signed)
Signature pad not working at this time 

## 2017-10-03 NOTE — ED Notes (Signed)
PT states understanding of care given, follow up care, and medication prescribed. PT ambulated from ED to car with a steady gait. 

## 2017-10-03 NOTE — ED Provider Notes (Signed)
TIME SEEN: 1:24 AM  CHIEF COMPLAINT: Chest pain, left arm pain  HPI: Patient is a 67 year old female with history of hypertension, hyperlipidemia, cervical spondylosis with chronic pain who presents to the emergency department with complaints of left arm pain for the past several days.  Patient is left-hand dominant.  No known injury but states she does use his arm for everything.  She states pain is worse with movement and started in the forearm but radiates into the shoulder and neck.  States it radiated into the left chest wall as well.  Pain is not exertional or pleuritic.  No shortness of breath, nausea, vomiting, diaphoresis, dizziness.  Pain has improved in the ED.  No redness, warmth, ecchymosis or swelling noted.  ROS: See HPI Constitutional: no fever  Eyes: no drainage  ENT: no runny nose   Cardiovascular:   chest pain  Resp: no SOB  GI: no vomiting GU: no dysuria Integumentary: no rash  Allergy: no hives  Musculoskeletal: no leg swelling  Neurological: no slurred speech ROS otherwise negative  PAST MEDICAL HISTORY/PAST SURGICAL HISTORY:  Past Medical History:  Diagnosis Date  . Cervical spondylosis   . Chronic pain   . Depression   . Heart murmur   . Hyperlipidemia   . Hypertension   . Tremors of nervous system     MEDICATIONS:  Prior to Admission medications   Medication Sig Start Date End Date Taking? Authorizing Provider  albuterol (PROVENTIL HFA;VENTOLIN HFA) 108 (90 Base) MCG/ACT inhaler Inhale 2 puffs into the lungs every 6 (six) hours as needed for wheezing or shortness of breath.    [provider]  amLODipine (NORVASC) 5 MG tablet Take 1 tablet (5 mg total) by mouth 2 (two) times daily. 09/05/17   Briscoe Deutscher, DO  aspirin 81 MG tablet Take 81 mg by mouth daily.    [provider]  diphenhydrAMINE (BENADRYL) 25 mg capsule Take 50 mg by mouth at bedtime. Takes 1/2 tablet at bedtime. Could not take during the day because it very tired      [provider]  ibuprofen (ADVIL,MOTRIN) 200 MG tablet Take 800 mg by mouth every 6 (six) hours as needed.     [provider]  ranitidine (ZANTAC) 150 MG tablet Take 150 mg by mouth 2 (two) times daily.    [provider]  sertraline (ZOLOFT) 100 MG tablet Take 1 tablet (100 mg total) by mouth daily. 05/08/17   Briscoe Deutscher, DO    ALLERGIES:  Allergies  Allergen Reactions  . Cortisone Anaphylaxis  . Erythromycin Base Anaphylaxis  . Neurontin [Gabapentin] Swelling  . Eggs Or Egg-Derived Products   . Flexeril [Cyclobenzaprine]     "pinching in chest"  . Naproxen     "pinching in chest"    SOCIAL HISTORY:  Social History   Tobacco Use  . Smoking status: Former Smoker    Last attempt to quit: 06/12/2005    Years since quitting: 12.3  . Smokeless tobacco: Never Used  Substance Use Topics  . Alcohol use: No    FAMILY HISTORY: Family History  Problem Relation Age of Onset  . Pulmonary embolism Mother   . Hypertension Father   . Heart disease Father   . Lung disease Sister   . Syncope episode Sister   . Lung disease Brother   . Heart disease Brother   . Cancer - Lung Brother     EXAM: BP (!) 151/90 (BP Location: Right Arm)   Pulse 94  Temp 98.2 F (36.8 C) (Oral)   Resp 16   SpO2 99%  CONSTITUTIONAL: Alert and oriented and responds appropriately to questions. Well-appearing; well-nourished HEAD: Normocephalic EYES: Conjunctivae clear, pupils appear equal, EOMI ENT: normal nose; moist mucous membranes NECK: Supple, no meningismus, no nuchal rigidity, no LAD, no midline spinal tenderness or step-off or deformity CARD: RRR; S1 and S2 appreciated; no murmurs, no clicks, no rubs, no gallops CHEST:  Chest wall is tender to palpation over the left chest wall.  No crepitus, ecchymosis, erythema, warmth, rash or other lesions present.   RESP: Normal chest excursion without splinting or tachypnea; breath sounds clear and equal bilaterally; no  wheezes, no rhonchi, no rales, no hypoxia or respiratory distress, speaking full sentences ABD/GI: Normal bowel sounds; non-distended; soft, non-tender, no rebound, no guarding, no peritoneal signs, no hepatosplenomegaly BACK:  The back appears normal and is non-tender to palpation, there is no CVA tenderness EXT: No significant tenderness throughout the left arm.  No bony deformity.  No crepitus, ecchymosis, redness or warmth.  Compartments are soft.  2+ radial pulses bilaterally.  Normal grip strength bilaterally.  She is mildly tender over the left trapezius muscle which reproduces her pain.  Normal ROM in all joints; otherwise extremities are non-tender to palpation; no edema; normal capillary refill; no cyanosis, no calf tenderness or swelling    SKIN: Normal color for age and race; warm; no rash NEURO: Moves all extremities equally normal sensation diffusely PSYCH: The patient's mood and manner are appropriate. Grooming and personal hygiene are appropriate.  MEDICAL DECISION MAKING: Patient here with what seems to be chest wall pain.  She has had 2- troponins in the waiting room and a normal chest x-ray.  Full range of motion in all joints of the left arm.  No signs of compartment syndrome, necrotizing fasciitis, cellulitis, gout, septic arthritis.  She is neurovascular intact distally.  No sign of DVT.  I suspect injury from repetitive use given this is her dominant arm and I suspect that is likely coming from her trapezius muscle and shoulder.  She has been seen by Dr. Lorin Mercy in the past.  Have recommended close follow-up with her orthopedist.  Will discharge with prescriptions of ibuprofen and Vicodin for pain control.  Given fentanyl here prior to discharge.  Doubt ACS, PE, dissection.  No sign of volume overload.  No sign of pneumonia.   At this time, I do not feel there is any life-threatening condition present. I have reviewed and discussed all results (EKG, imaging, lab, urine as appropriate)  and exam findings with patient/family. I have reviewed nursing notes and appropriate previous records.  I feel the patient is safe to be discharged home without further emergent workup and can continue workup as an outpatient as needed. Discussed usual and customary return precautions. Patient/family verbalize understanding and are comfortable with this plan.  Outpatient follow-up has been provided if needed. All questions have been answered.    EKG Interpretation  Date/Time:  Tuesday October 02 2017 15:32:32 EDT Ventricular Rate:  103 PR Interval:  156 QRS Duration: 82 QT Interval:  346 QTC Calculation: 453 R Axis:   42 Text Interpretation:  Sinus tachycardia versus artifact Septal infarct , age undetermined T wave abnormality, consider inferolateral ischemia Abnormal ECG No old tracing to compare needs repeat EKG Confirmed by Pryor Curia [phone]) on 10/03/2017 1:25:02 AM        EKG Interpretation  Date/Time:  Tuesday October 02 2017 16:03:57 EDT Ventricular Rate:  85  PR Interval:  152 QRS Duration: 88 QT Interval:  356 QTC Calculation: 423 R Axis:   -24 Text Interpretation:  Normal sinus rhythm Minimal voltage criteria for LVH, may be normal variant Borderline ECG No ischemic changes as seen on previous EKG which was poor quality Confirmed by Pryor Curia [phone]) on 10/03/2017 1:26:02 AM           Kaileigh Viswanathan, Delice Bison, DO 10/03/17 0205

## 2017-11-02 ENCOUNTER — Ambulatory Visit (INDEPENDENT_AMBULATORY_CARE_PROVIDER_SITE_OTHER): Payer: Medicare Other | Admitting: Family Medicine

## 2017-11-02 ENCOUNTER — Ambulatory Visit (INDEPENDENT_AMBULATORY_CARE_PROVIDER_SITE_OTHER): Payer: Medicare Other

## 2017-11-02 ENCOUNTER — Encounter: Payer: Self-pay | Admitting: Family Medicine

## 2017-11-02 VITALS — BP 140/82 | HR 83 | Temp 97.8°F | Ht 70.0 in | Wt 252.6 lb

## 2017-11-02 DIAGNOSIS — I10 Essential (primary) hypertension: Secondary | ICD-10-CM

## 2017-11-02 DIAGNOSIS — M542 Cervicalgia: Secondary | ICD-10-CM

## 2017-11-02 DIAGNOSIS — M25512 Pain in left shoulder: Secondary | ICD-10-CM

## 2017-11-02 DIAGNOSIS — M47812 Spondylosis without myelopathy or radiculopathy, cervical region: Secondary | ICD-10-CM | POA: Diagnosis not present

## 2017-11-02 DIAGNOSIS — F341 Dysthymic disorder: Secondary | ICD-10-CM

## 2017-11-02 DIAGNOSIS — M5412 Radiculopathy, cervical region: Secondary | ICD-10-CM

## 2017-11-02 MED ORDER — AMLODIPINE BESYLATE 5 MG PO TABS: 5.0000 mg | ORAL_TABLET | Freq: Two times a day (BID) | ORAL | 1 refills | Status: DC

## 2017-11-02 MED ORDER — SERTRALINE HCL 100 MG PO TABS: 100.0000 mg | ORAL_TABLET | Freq: Every day | ORAL | 1 refills | Status: DC

## 2017-11-02 MED ORDER — HYDROCODONE-ACETAMINOPHEN 5-325 MG PO TABS: 1.0000 | ORAL_TABLET | Freq: Four times a day (QID) | ORAL | 0 refills | Status: DC | PRN

## 2017-11-02 MED ORDER — AMLODIPINE BESYLATE 5 MG PO TABS: 5.0000 mg | ORAL_TABLET | Freq: Two times a day (BID) | ORAL | 0 refills | Status: DC

## 2017-11-02 NOTE — Progress Notes (Signed)
Callen Zuba is a 67 y.o. female is here for follow up.  History of Present Illness:   HPI: Patient presents with cervical and left shoulder pain with radiation to left arm. Toothache. ER visit reviewed - no cardiac concern. Given Norco. Helps. Cannot tolerate prednisone, percocet., flexeril, Neurontin. Left handed. Dropping items. Can't sleep due to pain. Has seen Lorin Mercy (Orthopedics) and Jannifer Franklin (Neurology) in the past. No PMR or Neurosurgery. Hx of original trauma to neck - fall. Was offered surgery at one point. Has MRI on CD at home per her.  Health Maintenance Due  Topic Date Due  . Hepatitis C Screening  06[birth year]  . HIV Screening  03/23/1968  . TETANUS/TDAP  03/23/1972   Depression screen PHQ 2/9 05/08/2017 11/07/2016  Decreased Interest 3 1  Down, Depressed, Hopeless 2 0  PHQ - 2 Score 5 1  Altered sleeping 3 3  Tired, decreased energy 3 1  Change in appetite 1 0  Feeling bad or failure about yourself  0 0  Trouble concentrating 2 1  Moving slowly or fidgety/restless 0 0  Suicidal thoughts 0 0  PHQ-9 Score 14 6  Difficult doing work/chores Very difficult -   PMHx, SurgHx, SocialHx, FamHx, Medications, and Allergies were reviewed in the Visit Navigator and updated as appropriate.   Patient Active Problem List   Diagnosis Date Noted  . Chronic insomnia 01/24/2017  . Gastroesophageal reflux disease without esophagitis 11/07/2016  . Essential hypertension 11/07/2016  . Dysthymia 11/07/2016  . Tremor 07/27/2015  . Carpal tunnel syndrome on left 06/04/2015  . Carpal tunnel syndrome on right 06/04/2015  . Cervical spondylosis without myelopathy 06/04/2015  . Primary osteoarthritis of first carpometacarpal joint of left hand 06/04/2015  . Primary osteoarthritis of first carpometacarpal joint of right hand 06/04/2015  . 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 Primary osteoarthritis of right wrist 06/04/2015   Social History   Tobacco Use  . Smoking status:  Former Smoker    Last attempt to quit: 06/12/2005    Years since quitting: 12.4  . Smokeless tobacco: Never Used  Substance Use Topics  . Alcohol use: No  . Drug use: No   Current Medications and Allergies:   Current Outpatient Medications:  .  albuterol (PROVENTIL HFA;VENTOLIN HFA) 108 (90 Base) MCG/ACT inhaler, Inhale 2 puffs into the lungs every 6 (six) hours as needed for wheezing or shortness of breath., Disp: , Rfl:  .  amLODipine (NORVASC) 5 MG tablet, Take 1 tablet (5 mg total) by mouth 2 (two) times daily., Disp: 90 tablet, Rfl: 0 .  aspirin 81 MG tablet, Take 81 mg by mouth daily., Disp: , Rfl:  .  diphenhydrAMINE (BENADRYL) 25 mg capsule, Take 50 mg by mouth at bedtime. Takes 1/2 tablet at bedtime. Could not take during the day because it very tired , Disp: , Rfl:  .  HYDROcodone-acetaminophen (NORCO/VICODIN) 5-325 MG tablet, Take 2 tablets by mouth every 6 (six) hours as needed., Disp: 12 tablet, Rfl: 0 .  ibuprofen (ADVIL,MOTRIN) 800 MG tablet, Take 1 tablet (800 mg total) by mouth every 8 (eight) hours as needed for mild pain., Disp: 30 tablet, Rfl: 0 .  ranitidine (ZANTAC) 150 MG tablet, Take 150 mg by mouth 2 (two) times daily., Disp: , Rfl:  .  sertraline (ZOLOFT) 100 MG tablet, Take 1 tablet (100 mg total) by mouth daily., Disp: 90 tablet, Rfl: 1   Allergies  Allergen Reactions  . Cortisone Anaphylaxis  . Erythromycin Base Anaphylaxis  .  Neurontin [Gabapentin] Swelling  . Eggs Or Egg-Derived Products   . Flexeril [Cyclobenzaprine]     "pinching in chest"  . Naproxen     "pinching in chest"   Review of Systems   Pertinent items are noted in the HPI. Otherwise, ROS is negative.  Vitals:   Vitals:   11/02/17 1351  BP: 140/82  Pulse: 83  Temp: 97.8 F (36.6 C)  TempSrc: Oral  SpO2: 98%  Weight: 252 lb 9.6 oz (114.6 kg)  Height: 5\' 10"  (1.778 m)     Body mass index is 36.24 kg/m.  Physical Exam:   Physical Exam  Constitutional: She appears  well-nourished.  HENT:  Head: Normocephalic and atraumatic.  Eyes: Pupils are equal, round, and reactive to light. EOM are normal.  Neck: Normal range of motion. Neck supple.  Cardiovascular: Normal rate, regular rhythm, normal heart sounds and intact distal pulses.  Pulmonary/Chest: Effort normal.  Abdominal: Soft.  Musculoskeletal:       Left shoulder: She exhibits decreased range of motion, bony tenderness and decreased strength. She exhibits normal pulse.       Cervical back: She exhibits decreased range of motion, bony tenderness, pain and spasm.  Skin: Skin is warm.  Psychiatric: She has a normal mood and affect. Her behavior is normal.  Nursing note and vitals reviewed.  EXAM: LEFT SHOULDER - 2+ VIEW  FINDINGS: No acute displaced fracture or malalignment. Moderate degenerative changes of the AC joint. The left lung apex is clear.  IMPRESSION: No acute osseous abnormality.  Degenerative changes of the [facility] joint.  EXAM: CERVICAL SPINE - 2-3 VIEW  COMPARISON:  None.  FINDINGS: Mild reversal of cervical lordosis. Vertebral body heights are normal. Prevertebral soft tissue thickness is normal. Dens and lateral masses are unremarkable. Mild carotid vascular calcification. Moderate diffuse degenerative changes C3 through C7 with disc space narrowing and anterior osteophytes. Mild degenerative changes at C2-C3.  IMPRESSION: Reversal of cervical lordosis with moderate diffuse degenerative changes of the cervical spine. No acute osseous abnormality  Assessment and Plan:   Graham was seen today for hospitalization follow-up.  Diagnoses and all orders for this visit:  Acute pain of left shoulder -     DG Shoulder Left; Future  Dysthymia -     sertraline (ZOLOFT) 100 MG tablet; Take 1 tablet (100 mg total) by mouth daily.  Essential hypertension -     amLODipine (NORVASC) 5 MG tablet; Take 1 tablet (5 mg total) by mouth 2 (two) times daily.  Neck pain -     DG  Cervical Spine 2 or 3 views  Cervical radicular pain -     HYDROcodone-acetaminophen (NORCO/VICODIN) 5-325 MG tablet; Take 1 tablet by mouth every 6 (six) hours as needed for moderate pain. -     Ambulatory referral to Neurosurgery   . Reviewed expectations re: course of current medical issues. . Discussed self-management of symptoms. . Outlined signs and symptoms indicating need for more acute intervention. . Patient verbalized understanding and all questions were answered. Marland Kitchen Health Maintenance issues including appropriate healthy diet, exercise, and smoking avoidance were discussed with patient. . See orders for this visit as documented in the electronic medical record. . Patient received an After Visit Summary.  Briscoe Deutscher, DO , [place] 11/04/2017  Future Appointments  Date Time Provider [place]  01/24/2018 11:30 AM Kathrynn Ducking, MD [facility] None

## 2017-11-22 DIAGNOSIS — I10 Essential (primary) hypertension: Secondary | ICD-10-CM | POA: Diagnosis not present

## 2017-11-22 DIAGNOSIS — M4722 Other spondylosis with radiculopathy, cervical region: Secondary | ICD-10-CM | POA: Diagnosis not present

## 2017-11-22 DIAGNOSIS — Z6836 Body mass index (BMI) 36.0-36.9, adult: Secondary | ICD-10-CM | POA: Diagnosis not present

## 2018-01-01 DIAGNOSIS — H35033 Hypertensive retinopathy, bilateral: Secondary | ICD-10-CM | POA: Diagnosis not present

## 2018-01-01 DIAGNOSIS — H35363 Drusen (degenerative) of macula, bilateral: Secondary | ICD-10-CM | POA: Diagnosis not present

## 2018-01-01 DIAGNOSIS — H43813 Vitreous degeneration, bilateral: Secondary | ICD-10-CM | POA: Diagnosis not present

## 2018-01-01 DIAGNOSIS — H0015 Chalazion left lower eyelid: Secondary | ICD-10-CM | POA: Diagnosis not present

## 2018-01-01 LAB — HM DIABETES EYE EXAM

## 2018-01-03 DIAGNOSIS — H0015 Chalazion left lower eyelid: Secondary | ICD-10-CM | POA: Insufficient documentation

## 2018-01-03 DIAGNOSIS — H35363 Drusen (degenerative) of macula, bilateral: Secondary | ICD-10-CM | POA: Insufficient documentation

## 2018-01-03 DIAGNOSIS — H43813 Vitreous degeneration, bilateral: Secondary | ICD-10-CM | POA: Insufficient documentation

## 2018-01-03 DIAGNOSIS — Z8669 Personal history of other diseases of the nervous system and sense organs: Secondary | ICD-10-CM | POA: Insufficient documentation

## 2018-01-03 HISTORY — DX: Personal history of other diseases of the nervous system and sense organs: Z86.69

## 2018-01-18 DIAGNOSIS — H0015 Chalazion left lower eyelid: Secondary | ICD-10-CM | POA: Diagnosis not present

## 2018-01-24 ENCOUNTER — Encounter: Payer: Self-pay | Admitting: Neurology

## 2018-01-24 ENCOUNTER — Ambulatory Visit (INDEPENDENT_AMBULATORY_CARE_PROVIDER_SITE_OTHER): Payer: Medicare Other | Admitting: Neurology

## 2018-01-24 VITALS — BP 150/94 | HR 69 | Ht 70.0 in | Wt 252.5 lb

## 2018-01-24 DIAGNOSIS — R251 Tremor, unspecified: Secondary | ICD-10-CM

## 2018-01-24 NOTE — Progress Notes (Signed)
Reason for visit: Tremors  Brooke Coleman is an 67 y.o. female  History of present illness:  Brooke Coleman is a 67 year old left-handed black female with a history of tremors affecting both upper extremities.  The patient is able perform handwriting, she can feed herself fairly well.  When she is nervous, the tremors are worse.  She has had a lot of trouble with neck pain and left shoulder and arm discomfort, she was seen by Dr. Christella Noa, she was told she needed cervical spine surgery.  The patient has decided not to pursue surgery currently.  The patient has not wanted any medications for the tremor.  She has not noted any significant change over the last 6 months in this regard.  She denies any balance problems or any falls.  She does have some insomnia, she takes 50 mg of Benadryl at night for sleep.  Past Medical History:  Diagnosis Date  . Cervical spondylosis   . Chronic pain   . Depression   . Heart murmur   . Hyperlipidemia   . Hypertension   . Tremors of nervous system     History reviewed. No pertinent surgical history.  Family History  Problem Relation Age of Onset  . Pulmonary embolism Mother   . Hypertension Father   . Heart disease Father   . Lung disease Sister   . Syncope episode Sister   . Lung disease Brother   . Heart disease Brother   . Cancer - Lung Brother     Social history:  reports that she quit smoking about 12 years ago. She has never used smokeless tobacco. She reports that she does not drink alcohol or use drugs.    Allergies  Allergen Reactions  . Cortisone Anaphylaxis  . Erythromycin Base Anaphylaxis  . Neurontin [Gabapentin] Swelling  . Eggs Or Egg-Derived Products   . Flexeril [Cyclobenzaprine]     "pinching in chest"  . Naproxen     "pinching in chest"    Medications:  Prior to Admission medications   Medication Sig Start Date End Date Taking? Authorizing Provider  albuterol (PROVENTIL HFA;VENTOLIN HFA) 108 (90 Base) MCG/ACT  inhaler Inhale 2 puffs into the lungs every 6 (six) hours as needed for wheezing or shortness of breath.   Yes [provider]  amLODipine (NORVASC) 5 MG tablet Take 1 tablet (5 mg total) by mouth 2 (two) times daily. 11/02/17  Yes Briscoe Deutscher, DO  aspirin 81 MG tablet Take 81 mg by mouth daily.   Yes [provider]  diphenhydrAMINE (BENADRYL) 25 mg capsule Take 50 mg by mouth at bedtime. Takes 1/2 tablet at bedtime. Could not take during the day because it very tired    Yes [provider]  HYDROcodone-acetaminophen (NORCO/VICODIN) 5-325 MG tablet Take 1 tablet by mouth every 6 (six) hours as needed for moderate pain. 11/02/17  Yes Briscoe Deutscher, DO  ibuprofen (ADVIL,MOTRIN) 800 MG tablet Take 1 tablet (800 mg total) by mouth every 8 (eight) hours as needed for mild pain. 10/03/17  Yes Ward, Delice Bison, DO  ranitidine (ZANTAC) 150 MG tablet Take 150 mg by mouth 2 (two) times daily.   Yes [provider]  sertraline (ZOLOFT) 100 MG tablet Take 1 tablet (100 mg total) by mouth daily. 11/02/17  Yes Briscoe Deutscher, DO    ROS:  Out of a complete 14 system review of symptoms, the patient complains only of the following symptoms, and all other reviewed systems are negative.  Tremor  Neck pain Insomnia Heart murmur Frequent waking Low back pain Depression  Blood pressure (!) 150/94, pulse 69, height 5\' 10"  (1.778 m), weight 252 lb 8 oz (114.5 kg).  Physical Exam  General: The patient is alert and cooperative at the time of the examination.  Skin: No significant peripheral edema is noted.   Neurologic Exam  Mental status: The patient is alert and oriented x 3 at the time of the examination. The patient has apparent normal recent and remote memory, with an apparently normal attention span and concentration ability.   Cranial nerves: Facial symmetry is present. Speech is normal, no aphasia or dysarthria is noted. Extraocular movements are full. Visual  fields are full.  Masking of the face is seen.  Motor: The patient has good strength in all 4 extremities.  Sensory examination: Soft touch sensation is symmetric on the face, arms, and legs.  Coordination: The patient has good finger-nose-finger and heel-to-shin bilaterally.  Resting tremors are seen in a symmetric fashion in both upper extremities.  Gait and station: The patient has a normal gait.  The patient walks with good stride and good turns, has good arm swing that is symmetric, tremors are noted in both hands with walking.  Tandem gait is normal. Romberg is negative. No drift is seen.  Reflexes: Deep tendon reflexes are symmetric.   Assessment/Plan:  1.  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  The patient has true resting tremors that are symmetric from one side to the next, the patient has masking of the face.  With walking, she has good arm swing bilaterally.  We will continue to follow the patient conservatively over time, she does not wish to have any medications for the tremor.  She will follow-up in 6 months.  Jill Alexanders MD 01/24/2018 12:02 PM  [facility] [address] [place], [place] [address]  Phone [phone] Fax [phone]

## 2018-02-22 ENCOUNTER — Other Ambulatory Visit: Payer: Self-pay | Admitting: Family Medicine

## 2018-02-22 DIAGNOSIS — I10 Essential (primary) hypertension: Secondary | ICD-10-CM

## 2018-04-25 ENCOUNTER — Other Ambulatory Visit: Payer: Self-pay | Admitting: Family Medicine

## 2018-04-25 DIAGNOSIS — I10 Essential (primary) hypertension: Secondary | ICD-10-CM

## 2018-04-30 ENCOUNTER — Encounter: Payer: Self-pay | Admitting: Family Medicine

## 2018-04-30 ENCOUNTER — Ambulatory Visit (INDEPENDENT_AMBULATORY_CARE_PROVIDER_SITE_OTHER): Payer: Medicare Other | Admitting: Family Medicine

## 2018-04-30 VITALS — BP 144/88 | HR 81 | Temp 98.4°F | Ht 70.0 in | Wt 253.2 lb

## 2018-04-30 DIAGNOSIS — R05 Cough: Secondary | ICD-10-CM

## 2018-04-30 DIAGNOSIS — M5412 Radiculopathy, cervical region: Secondary | ICD-10-CM | POA: Diagnosis not present

## 2018-04-30 DIAGNOSIS — M180 Bilateral primary osteoarthritis of first carpometacarpal joints: Secondary | ICD-10-CM

## 2018-04-30 DIAGNOSIS — M47812 Spondylosis without myelopathy or radiculopathy, cervical region: Secondary | ICD-10-CM | POA: Diagnosis not present

## 2018-04-30 DIAGNOSIS — K219 Gastro-esophageal reflux disease without esophagitis: Secondary | ICD-10-CM | POA: Diagnosis not present

## 2018-04-30 DIAGNOSIS — R059 Cough, unspecified: Secondary | ICD-10-CM

## 2018-04-30 DIAGNOSIS — J0100 Acute maxillary sinusitis, unspecified: Secondary | ICD-10-CM

## 2018-04-30 DIAGNOSIS — F341 Dysthymic disorder: Secondary | ICD-10-CM | POA: Diagnosis not present

## 2018-04-30 MED ORDER — ALBUTEROL SULFATE HFA 108 (90 BASE) MCG/ACT IN AERS
2.0000 | INHALATION_SPRAY | Freq: Four times a day (QID) | RESPIRATORY_TRACT | 1 refills | Status: DC | PRN
Start: 2018-04-30 — End: 2019-04-02

## 2018-04-30 MED ORDER — IBUPROFEN 800 MG PO TABS: 800.0000 mg | ORAL_TABLET | Freq: Three times a day (TID) | ORAL | 0 refills | Status: DC | PRN

## 2018-04-30 MED ORDER — RANITIDINE HCL 150 MG PO TABS: 150.0000 mg | ORAL_TABLET | Freq: Two times a day (BID) | ORAL | 3 refills | Status: DC

## 2018-04-30 MED ORDER — SERTRALINE HCL 100 MG PO TABS: 100.0000 mg | ORAL_TABLET | Freq: Every day | ORAL | 1 refills | Status: DC

## 2018-04-30 MED ORDER — AMOXICILLIN 875 MG PO TABS: 875.0000 mg | ORAL_TABLET | Freq: Two times a day (BID) | ORAL | 0 refills | Status: DC

## 2018-04-30 NOTE — Patient Instructions (Addendum)
[facility] at [address] has the egg free flu vaccine.

## 2018-04-30 NOTE — Progress Notes (Signed)
Brooke Coleman is a 67 y.o. female here for an acute visit.  History of Present Illness:   [name], CMA acting as scribe for Dr. Briscoe Deutscher.   HPI: Patient in office for evaluation for nasal congestion for over a week. She states that she has had pressure and fullness in ears. Denies any cough or sore throat but she does feel drainage from nose. She has been taking over the counter medication with no improvement. She thinks that last week she may have ran a fever with body aches but feels a lot better this week. No nausea or vomiting.  She is also out of all medications and would like refills sent in.    PMHx, SurgHx, SocialHx, Medications, and Allergies were reviewed in the Visit Navigator and updated as appropriate.  Current Medications:   .  albuterol (PROVENTIL HFA;VENTOLIN HFA) 108 (90 Base) MCG/ACT inhaler, Inhale 2 puffs into the lungs every 6 (six) hours as needed for wheezing or shortness of breath., Disp: , Rfl:  .  amLODipine (NORVASC) 5 MG tablet, TAKE 1 TABLET BY MOUTH TWICE DAILY, Disp: 180 tablet, Rfl: 1 .  aspirin 81 MG tablet, Take 81 mg by mouth daily., Disp: , Rfl:  .  diphenhydrAMINE (BENADRYL) 25 mg capsule, Take 50 mg by mouth at bedtime. Takes 1/2 tablet at bedtime. Could not take during the day because it very tired , Disp: , Rfl:  .  HYDROcodone-acetaminophen (NORCO/VICODIN) 5-325 MG tablet, Take 1 tablet by mouth every 6 (six) hours as needed for moderate pain., Disp: 30 tablet, Rfl: 0 .  ibuprofen (ADVIL,MOTRIN) 800 MG tablet, Take 1 tablet (800 mg total) by mouth every 8 (eight) hours as needed for mild pain., Disp: 30 tablet, Rfl: 0 .  ranitidine (ZANTAC) 150 MG tablet, Take 150 mg by mouth 2 (two) times daily., Disp: , Rfl:  .  sertraline (ZOLOFT) 100 MG tablet, Take 1 tablet (100 mg total) by mouth daily., Disp: 90 tablet, Rfl: 1   Allergies  Allergen Reactions  . Cortisone Anaphylaxis  . Erythromycin Base Anaphylaxis  . Neurontin [Gabapentin]  Swelling  . Eggs Or Egg-Derived Products   . Flexeril [Cyclobenzaprine]     "pinching in chest"  . Naproxen     "pinching in chest"   Review of Systems:   Pertinent items are noted in the HPI. Otherwise, ROS is negative.  Vitals:   Vitals:   04/30/18 1245  BP: (!) 144/88  Pulse: 81  Temp: 98.4 F (36.9 C)  TempSrc: Oral  SpO2: 99%  Weight: 253 lb 3.2 oz (114.9 kg)  Height: 5\' 10"  (1.778 m)     Body mass index is 36.33 kg/m.  Physical Exam:   Physical Exam  Constitutional: She is oriented to person, place, and time. She appears well-nourished.  HENT:  Head: Normocephalic and atraumatic.  Nose: Mucosal edema present.  Mouth/Throat: Posterior oropharyngeal erythema present.  Eyes: Pupils are equal, round, and reactive to light. EOM are normal.  Neck: Normal range of motion. Neck supple.  Cardiovascular: Normal rate, regular rhythm, normal heart sounds and intact distal pulses.  Pulmonary/Chest: Effort normal.  Abdominal: Soft.  Neurological: She is alert and oriented to person, place, and time. She displays tremor.  Skin: Skin is warm.  Psychiatric: She has a normal mood and affect. Her behavior is normal.  Nursing note and vitals reviewed.  Assessment and Plan:   1. Dysthymia Well controlled.  No signs of complications, medication side effects, or red flags.  Continue current regimen.    2. Subacute maxillary sinusitis NEW. Discussed symptomatic care and red flags. See orders.  3. Arthritis of carpometacarpal (CMC) joints of both thumbs Worsening.  4. Cervical spondylosis without myelopathy Worsening.  5. Gastroesophageal reflux disease without esophagitis Well controlled.  No signs of complications, medication side effects, or red flags.  Continue current regimen.    6. Cervical radicular pain Ongoing. Offered surgical management by Neurosurgeon but patient does not want surgery.  Meds ordered this encounter  Medications  . sertraline (ZOLOFT) 100 MG  tablet    Sig: Take 1 tablet (100 mg total) by mouth daily.    Dispense:  90 tablet    Refill:  1  . ranitidine (ZANTAC) 150 MG tablet    Sig: Take 1 tablet (150 mg total) by mouth 2 (two) times daily.    Dispense:  90 tablet    Refill:  3  . ibuprofen (ADVIL,MOTRIN) 800 MG tablet    Sig: Take 1 tablet (800 mg total) by mouth every 8 (eight) hours as needed for mild pain.    Dispense:  30 tablet    Refill:  0  . albuterol (PROVENTIL HFA;VENTOLIN HFA) 108 (90 Base) MCG/ACT inhaler    Sig: Inhale 2 puffs into the lungs every 6 (six) hours as needed for wheezing or shortness of breath.    Dispense:  18 g    Refill:  1  . amoxicillin (AMOXIL) 875 MG tablet    Sig: Take 1 tablet (875 mg total) by mouth 2 (two) times daily.    Dispense:  20 tablet    Refill:  0   . Reviewed expectations re: course of current medical issues. . Discussed self-management of symptoms. . Outlined signs and symptoms indicating need for more acute intervention. . Patient verbalized understanding and all questions were answered. Marland Kitchen Health Maintenance issues including appropriate healthy diet, exercise, and smoking avoidance were discussed with patient. . See orders for this visit as documented in the electronic medical record. . Patient received an After Visit Summary.  CMA served as Education administrator during this visit. History, Physical, and Plan performed by medical provider. The above documentation has been reviewed and is accurate and complete. Briscoe Deutscher, D.O.  Briscoe Deutscher, DO [place], [facility] 05/01/2018

## 2018-07-05 ENCOUNTER — Ambulatory Visit (INDEPENDENT_AMBULATORY_CARE_PROVIDER_SITE_OTHER): Payer: Medicare Other | Admitting: Physician Assistant

## 2018-07-05 ENCOUNTER — Encounter: Payer: Self-pay | Admitting: Physician Assistant

## 2018-07-05 VITALS — BP 130/76 | HR 102 | Temp 99.5°F | Ht 70.0 in | Wt 254.0 lb

## 2018-07-05 DIAGNOSIS — J101 Influenza due to other identified influenza virus with other respiratory manifestations: Secondary | ICD-10-CM

## 2018-07-05 LAB — POC INFLUENZA A&B (BINAX/QUICKVUE)
Influenza A, POC: POSITIVE — AB
Influenza B, POC: NEGATIVE

## 2018-07-05 MED ORDER — OSELTAMIVIR PHOSPHATE 75 MG PO CAPS: 75.0000 mg | ORAL_CAPSULE | Freq: Two times a day (BID) | ORAL | 0 refills | Status: DC

## 2018-07-05 MED ORDER — BENZONATATE 100 MG PO CAPS: 100.0000 mg | ORAL_CAPSULE | Freq: Three times a day (TID) | ORAL | 1 refills | Status: DC | PRN

## 2018-07-05 NOTE — Patient Instructions (Signed)
It was great to see you!  You have the flu.  I have sent in Tamiflu -- please discuss this with your pharmacist to make sure it is safe to take with your egg allergy.  May take tessalon perles as needed for cough.  Push fluids and get plenty of rest. Please return if you are not improving as expected, or if you have high fevers (>101.5) or difficulty swallowing or worsening productive cough.  Call clinic with questions.  I hope you start feeling better soon!

## 2018-07-05 NOTE — Progress Notes (Signed)
Brooke Coleman is a 68 y.o. female here for a new problem.  I acted as a Education administrator for [employer], PA-C Anselmo Pickler, LPN  History of Present Illness:   Chief Complaint  Patient presents with  . Cough    Cough  This is a new problem. Episode onset: Started on Wednesday. The problem has been gradually worsening. The problem occurs constantly. The cough is productive of sputum (Expectorating thick brown sputum). Associated symptoms include ear congestion, ear pain (Left), a fever, headaches, nasal congestion, postnasal drip, shortness of breath, sweats (Last night) and wheezing. Associated symptoms comments: Body aches, fatigue. The symptoms are aggravated by lying down. Risk factors: Grandson Dx with Flu today. She has tried nothing for the symptoms. Her past medical history is significant for asthma. There is no history of bronchitis or pneumonia.   She states that she has been hydrating well.  Past Medical History:  Diagnosis Date  . Cervical spondylosis   . Chronic pain   . Depression   . Heart murmur   . Hyperlipidemia   . Hypertension   . Tremors of nervous system      Social History   Socioeconomic History  . Marital status: Divorced    Spouse name: Not on file  . Number of children: 2  . Years of education: AAS  . Highest education level: Not on file  Occupational History  . Occupation: retired  Scientific laboratory technician  . Financial resource strain: Not on file  . Food insecurity:    Worry: Not on file    Inability: Not on file  . Transportation needs:    Medical: Not on file    Non-medical: Not on file  Tobacco Use  . Smoking status: Former Smoker    Last attempt to quit: 06/12/2005    Years since quitting: 13.0  . Smokeless tobacco: Never Used  Substance and Sexual Activity  . Alcohol use: No  . Drug use: No  . Sexual activity: Not on file  Lifestyle  . Physical activity:    Days per week: Not on file    Minutes per session: Not on file  . Stress: Not on file   Relationships  . Social connections:    Talks on phone: Not on file    Gets together: Not on file    Attends religious service: Not on file    Active member of club or organization: Not on file    Attends meetings of clubs or organizations: Not on file    Relationship status: Not on file  . Intimate partner violence:    Fear of current or ex partner: Not on file    Emotionally abused: Not on file    Physically abused: Not on file    Forced sexual activity: Not on file  Other Topics Concern  . Not on file  Social History Narrative   Lives at home w/ her daughter   Patient drinks 1 cup of coffee daily.   Patient is left handed.     History reviewed. No pertinent surgical history.  Family History  Problem Relation Age of Onset  . Pulmonary embolism Mother   . Hypertension Father   . Heart disease Father   . Lung disease Sister   . Syncope episode Sister   . Lung disease Brother   . Heart disease Brother   . Cancer - Lung Brother     Allergies  Allergen Reactions  . Cortisone Anaphylaxis  . Erythromycin Base Anaphylaxis  .  Neurontin [Gabapentin] Swelling  . Eggs Or Egg-Derived Products   . Flexeril [Cyclobenzaprine]     "pinching in chest"  . Naproxen     "pinching in chest"    Current Medications:   Current Outpatient Medications:  .  albuterol (PROVENTIL HFA;VENTOLIN HFA) 108 (90 Base) MCG/ACT inhaler, Inhale 2 puffs into the lungs every 6 (six) hours as needed for wheezing or shortness of breath., Disp: 18 g, Rfl: 1 .  amLODipine (NORVASC) 5 MG tablet, TAKE 1 TABLET BY MOUTH TWICE DAILY, Disp: 180 tablet, Rfl: 1 .  aspirin 81 MG tablet, Take 81 mg by mouth daily., Disp: , Rfl:  .  diphenhydrAMINE (BENADRYL) 25 mg capsule, Take 50 mg by mouth at bedtime. Takes 1/2 tablet at bedtime. Could not take during the day because it very tired , Disp: , Rfl:  .  ranitidine (ZANTAC) 150 MG tablet, Take 1 tablet (150 mg total) by mouth 2 (two) times daily., Disp: 90 tablet,  Rfl: 3 .  sertraline (ZOLOFT) 100 MG tablet, Take 1 tablet (100 mg total) by mouth daily., Disp: 90 tablet, Rfl: 1 .  benzonatate (TESSALON) 100 MG capsule, Take 1 capsule (100 mg total) by mouth 3 (three) times daily as needed for cough., Disp: 20 capsule, Rfl: 1 .  oseltamivir (TAMIFLU) 75 MG capsule, Take 1 capsule (75 mg total) by mouth 2 (two) times daily., Disp: 10 capsule, Rfl: 0   Review of Systems:   Review of Systems  Constitutional: Positive for fever.  HENT: Positive for ear pain (Left) and postnasal drip.   Respiratory: Positive for cough, shortness of breath and wheezing.   Neurological: Positive for headaches.    Vitals:   Vitals:   07/05/18 1143  BP: 130/76  Pulse: (!) 102  Temp: 99.5 F (37.5 C)  TempSrc: Oral  SpO2: 95%  Weight: 254 lb (115.2 kg)  Height: 5\' 10"  (1.778 m)     Body mass index is 36.45 kg/m.  Physical Exam:   Physical Exam Vitals signs and nursing note reviewed.  Constitutional:      General: She is not in acute distress.    Appearance: She is well-developed. She is not ill-appearing or toxic-appearing.  HENT:     Head: Normocephalic and atraumatic.     Right Ear: Tympanic membrane, ear canal and external ear normal. Tympanic membrane is not erythematous, retracted or bulging.     Left Ear: Tympanic membrane, ear canal and external ear normal. Tympanic membrane is not erythematous, retracted or bulging.     Nose: Mucosal edema, congestion and rhinorrhea present.     Right Sinus: No maxillary sinus tenderness or frontal sinus tenderness.     Left Sinus: No maxillary sinus tenderness or frontal sinus tenderness.     Mouth/Throat:     Pharynx: Uvula midline. Posterior oropharyngeal erythema present.     Tonsils: No tonsillar exudate. Swelling: 0 on the right. 0 on the left.  Eyes:     General: Lids are normal.     Conjunctiva/sclera: Conjunctivae normal.  Neck:     Trachea: Trachea normal.  Cardiovascular:     Rate and Rhythm: Normal  rate and regular rhythm.     Pulses: Normal pulses.     Heart sounds: Normal heart sounds, S1 normal and S2 normal.     Comments: No LE edema Pulmonary:     Effort: Pulmonary effort is normal.     Breath sounds: Normal breath sounds. No decreased breath sounds, wheezing, rhonchi or rales.  Comments: Lungs clear bilaterally Lymphadenopathy:     Cervical: No cervical adenopathy.  Skin:    General: Skin is warm and dry.  Neurological:     Mental Status: She is alert.     GCS: GCS eye subscore is 4. GCS verbal subscore is 5. GCS motor subscore is 6.  Psychiatric:        Speech: Speech normal.        Behavior: Behavior normal. Behavior is cooperative.    Results for orders placed or performed in visit on 07/05/18  POC Influenza A&B(BINAX/QUICKVUE)  Result Value Ref Range   Influenza A, POC Positive (A) Negative   Influenza B, POC Negative Negative     Assessment and Plan:   Aron was seen today for cough.  Diagnoses and all orders for this visit:  Influenza A -     POC Influenza A&B(BINAX/QUICKVUE)  Other orders -     oseltamivir (TAMIFLU) 75 MG capsule; Take 1 capsule (75 mg total) by mouth 2 (two) times daily. -     benzonatate (TESSALON) 100 MG capsule; Take 1 capsule (100 mg total) by mouth 3 (three) times daily as needed for cough.   No red flags on exam.  Will initiate Tamiflu per orders. She states that she has reactions to medications that may have egg. Discussed that she should discuss with pharmacist prior to taking. Offered Symbicort but she declined. She was agreeable to [employer]  Discussed taking medications as prescribed. Reviewed return precautions including worsening fever, SOB, worsening cough or other concerns. Push fluids and rest. I recommend that patient follow-up if symptoms worsen or persist despite treatment x 7-10 days, sooner if needed.   . Reviewed expectations re: course of current medical issues. . Discussed self-management of  symptoms. . Outlined signs and symptoms indicating need for more acute intervention. . Patient verbalized understanding and all questions were answered. . See orders for this visit as documented in the electronic medical record. . Patient received an After-Visit Summary.  CMA or LPN served as scribe during this visit. History, Physical, and Plan performed by medical provider. The above documentation has been reviewed and is accurate and complete.   Inda Coke, PA-C

## 2018-07-11 ENCOUNTER — Ambulatory Visit: Payer: Self-pay | Admitting: *Deleted

## 2018-07-11 NOTE — Telephone Encounter (Signed)
See note

## 2018-07-11 NOTE — Telephone Encounter (Signed)
Contacted pt regarding symptoms; pt states that she is still not feeling well; she says that she is coughing up and blowing out thick yellow secretions; she says that her last temp was 101.0 on 07/10/2018; she has no readings from today; reviewed AVS dated 07/05/2018; the pt does not want to be seen in the office; she would like for something to be called in; pt disconnected before nurse triage could be completed;  will route to office; pt was last seen by Inda Coke, [facility], 07/05/2018.      Reason for Disposition . Fever present > 3 days (72 hours)  Answer Assessment - Initial Assessment Questions 1. DIAGNOSIS CONFIRMATION: "When was the influenza diagnosed?" "By whom?" "Did you get a test for it?"     Seen in office 07/05/2018 2. MEDICINES: "Were you prescribed any medications for the influenza?"  (e.g., zanamivir [Relenza], oseltamavir [Tamiflu]).      tamiflu 3. ONSET of SYMPTOMS: "When did your symptoms start?"     Seen in office 07/05/2018 4. SYMPTOMS: "What symptoms are you most concerned about?" (e.g., runny nose, stuffy nose, sore throat, cough, breathing difficulty, fever)     Productive cough 5. COUGH: "How bad is the cough?"     n/a 6. FEVER: "Do you have a fever?" If so, ask: "What is your temperature, how was it measured, and when did it start?"     101.0 on 07/10/2018 7. RESPIRATORY DISTRESS: "Are you having any trouble breathing?" If yes, ask: "Describe your breathing."      n/a 8. FLU VACCINE: "Did you receive a flu shot this year?" (e.g., seasonal influenza, H1N1)    *No Answer* 9. PREGNANCY: "Is there any chance you are pregnant?" "When was your last menstrual period?"     *No Answer* 10. HIGH RISK for COMPLICATIONS: "Do you have any heart or lung problems? Do you have a weakened immune system?" (e.g., CHF, COPD, asthma, HIV positive, chemotherapy, renal failure, diabetes mellitus, sickle cell anemia)       *No Answer*  Protocols used: INFLUENZA FOLLOW-UP  CALL-A-AH

## 2018-07-11 NOTE — Telephone Encounter (Signed)
Left message on voicemail to call office. Pt will need to be seen for new symptoms, can not order antibiotic. Samantha and Dr. Juleen China are both out of the office this afternoon will be back tomorrow and will discuss with them then.

## 2018-07-15 NOTE — Telephone Encounter (Signed)
Left message on voicemail to call office. Needs to be seen if still not feeling better.

## 2018-08-08 ENCOUNTER — Ambulatory Visit: Payer: Medicare Other | Admitting: Neurology

## 2018-08-28 ENCOUNTER — Ambulatory Visit: Payer: Medicare Other | Admitting: Family Medicine

## 2018-09-18 ENCOUNTER — Ambulatory Visit: Payer: Medicare Other | Admitting: Family Medicine

## 2019-01-22 ENCOUNTER — Encounter: Payer: Self-pay | Admitting: Neurology

## 2019-01-22 ENCOUNTER — Other Ambulatory Visit: Payer: Self-pay

## 2019-01-22 ENCOUNTER — Ambulatory Visit (INDEPENDENT_AMBULATORY_CARE_PROVIDER_SITE_OTHER): Payer: Medicare Other | Admitting: Neurology

## 2019-01-22 VITALS — BP 122/70 | HR 80 | Temp 97.7°F | Ht 71.5 in | Wt 258.3 lb

## 2019-01-22 DIAGNOSIS — R251 Tremor, unspecified: Secondary | ICD-10-CM

## 2019-01-22 NOTE — Progress Notes (Signed)
Reason for visit: Tremor  Brooke Coleman is an 68 y.o. female  History of present illness:  Brooke Coleman is a 68 year old left-handed black female with a history of tremor that has been present since 2013.  The tremor is symmetric in nature and is primarily a resting tremor.  The patient has developed other features of parkinsonism with masking of the face and some mild hypophonia with speech.  She reports no significant change from 1 year ago, she indicates that the tremor is worse when she is upset.  She does drink coffee but only 1 cup a day.  The patient has bilateral carpal tunnel syndrome, she has been seen by Dr. Fredna Dow previously, she is having a lot of pain with her hands at this time.  She also has neck pain and pain up the back of her head, she has seen Dr. Christella Noa and has been told that she will need surgery.  She has decided not to have surgery at this time.  The patient denies any family history of tremor, she has not had any falls or significant change in balance.  She returns this office for an evaluation.  Past Medical History:  Diagnosis Date  . Cervical spondylosis   . Chronic pain   . Depression   . Heart murmur   . Hyperlipidemia   . Hypertension   . Tremors of nervous system     History reviewed. No pertinent surgical history.  Family History  Problem Relation Age of Onset  . Pulmonary embolism Mother   . Hypertension Father   . Heart disease Father   . Lung disease Sister   . Syncope episode Sister   . Lung disease Brother   . Heart disease Brother   . Cancer - Lung Brother     Social history:  reports that she quit smoking about 13 years ago. She has never used smokeless tobacco. She reports that she does not drink alcohol or use drugs.    Allergies  Allergen Reactions  . Cortisone Anaphylaxis  . Erythromycin Base Anaphylaxis  . Neurontin [Gabapentin] Swelling  . Eggs Or Egg-Derived Products   . Flexeril [Cyclobenzaprine]     "pinching in chest"   . Naproxen     "pinching in chest"    Medications:  Prior to Admission medications   Medication Sig Start Date End Date Taking? Authorizing Provider  albuterol (PROVENTIL HFA;VENTOLIN HFA) 108 (90 Base) MCG/ACT inhaler Inhale 2 puffs into the lungs every 6 (six) hours as needed for wheezing or shortness of breath. 04/30/18  Yes Briscoe Deutscher, DO  amLODipine (NORVASC) 5 MG tablet TAKE 1 TABLET BY MOUTH TWICE DAILY 04/25/18  Yes Briscoe Deutscher, DO  aspirin 81 MG tablet Take 81 mg by mouth daily.   Yes [provider]  benzonatate (TESSALON) 100 MG capsule Take 1 capsule (100 mg total) by mouth 3 (three) times daily as needed for cough. 07/05/18  Yes Inda Coke, PA  diphenhydrAMINE (BENADRYL) 25 mg capsule Take 50 mg by mouth at bedtime. Takes 1/2 tablet at bedtime. Could not take during the day because it very tired    Yes [provider]  ranitidine (ZANTAC) 150 MG tablet Take 1 tablet (150 mg total) by mouth 2 (two) times daily. 04/30/18  Yes Briscoe Deutscher, DO  sertraline (ZOLOFT) 100 MG tablet Take 1 tablet (100 mg total) by mouth daily. 04/30/18  Yes Briscoe Deutscher, DO    ROS:  Out of a complete 14 system review of  symptoms, the patient complains only of the following symptoms, and all other reviewed systems are negative.  Tremor Neck pain Bilateral hand pain  Blood pressure 122/70, pulse 80, temperature 97.7 F (36.5 C), temperature source Temporal, height 5' 11.5" (1.816 m), weight 258 lb 5 oz (117.2 kg), SpO2 97 %.  Physical Exam  General: The patient is alert and cooperative at the time of the examination.  The patient is markedly obese.  Skin: No significant peripheral edema is noted.   Neurologic Exam  Mental status: The patient is alert and oriented x 3 at the time of the examination. The patient has apparent normal recent and remote memory, with an apparently normal attention span and concentration ability.   Cranial nerves: Facial symmetry  is present. Speech is normal, no aphasia or dysarthria is noted. Extraocular movements are full. Visual fields are full.  Prominent masking of the face is seen.  A jaw tremor is noted.  Motor: The patient has good strength in all 4 extremities.  Sensory examination: Soft touch sensation is symmetric on the face, arms, and legs.  Coordination: The patient has good finger-nose-finger and heel-to-shin bilaterally.  Resting tremors are noted with both arms, the tremor suppressed with finger-nose-finger.  With handwriting, minimal translation of tremor into handwriting is noted.  Gait and station: The patient has a normal gait.  The patient has good symmetric arm swing with walking, tremor seen in both hands with walking.  Tandem gait is normal. Romberg is negative. No drift is seen.  Reflexes: Deep tendon reflexes are symmetric.   Assessment/Plan:  1.  Tremor, parkinsonism  2.  Cervical spondylosis  3.  Bilateral carpal tunnel syndrome  The patient appears to have a clinical blend between an essential tremor and parkinsonism.  She clearly has a true resting tremor suppressed by activity with the arm and she has masking of the face.  With walking, she has good stride and good arm swing bilaterally.  The patient will be sent for a DaTscan to determine whether she has Parkinson's disease or an essential tremor.  She will follow-up in 1 year, if she does have Parkinson's disease we will initiate treatment and see her sooner.  She has not wanted to go on medications for tremor previously.  Jill Alexanders MD 01/22/2019 9:23 AM  [facility] [address] [place], [place] [address]  Phone [phone] Fax [phone]

## 2019-03-10 ENCOUNTER — Other Ambulatory Visit: Payer: Self-pay | Admitting: Family Medicine

## 2019-03-10 DIAGNOSIS — I10 Essential (primary) hypertension: Secondary | ICD-10-CM

## 2019-03-10 MED ORDER — AMLODIPINE BESYLATE 5 MG PO TABS: 5.0000 mg | ORAL_TABLET | Freq: Two times a day (BID) | ORAL | 0 refills | Status: DC

## 2019-03-10 NOTE — Telephone Encounter (Signed)
amLODipine (NORVASC) 5 MG tablet  [facility] ([address]), [place] [phone] (Phone) [phone] (Fax)   Pt made appt for 10/21/

## 2019-04-02 ENCOUNTER — Other Ambulatory Visit: Payer: Self-pay

## 2019-04-02 ENCOUNTER — Ambulatory Visit (INDEPENDENT_AMBULATORY_CARE_PROVIDER_SITE_OTHER): Payer: Medicare Other | Admitting: Family Medicine

## 2019-04-02 ENCOUNTER — Ambulatory Visit (INDEPENDENT_AMBULATORY_CARE_PROVIDER_SITE_OTHER): Payer: Medicare Other

## 2019-04-02 ENCOUNTER — Encounter: Payer: Self-pay | Admitting: Family Medicine

## 2019-04-02 VITALS — BP 134/78 | Temp 98.3°F | Ht 71.5 in | Wt 257.1 lb

## 2019-04-02 VITALS — BP 134/78 | HR 87 | Temp 98.3°F | Ht 71.5 in | Wt 257.0 lb

## 2019-04-02 DIAGNOSIS — E538 Deficiency of other specified B group vitamins: Secondary | ICD-10-CM

## 2019-04-02 DIAGNOSIS — Z1231 Encounter for screening mammogram for malignant neoplasm of breast: Secondary | ICD-10-CM

## 2019-04-02 DIAGNOSIS — I10 Essential (primary) hypertension: Secondary | ICD-10-CM | POA: Diagnosis not present

## 2019-04-02 DIAGNOSIS — E559 Vitamin D deficiency, unspecified: Secondary | ICD-10-CM

## 2019-04-02 DIAGNOSIS — R059 Cough, unspecified: Secondary | ICD-10-CM

## 2019-04-02 DIAGNOSIS — F341 Dysthymic disorder: Secondary | ICD-10-CM

## 2019-04-02 DIAGNOSIS — Z0000 Encounter for general adult medical examination without abnormal findings: Secondary | ICD-10-CM | POA: Diagnosis not present

## 2019-04-02 DIAGNOSIS — R251 Tremor, unspecified: Secondary | ICD-10-CM

## 2019-04-02 DIAGNOSIS — R05 Cough: Secondary | ICD-10-CM | POA: Diagnosis not present

## 2019-04-02 LAB — CBC WITH DIFFERENTIAL/PLATELET
Basophils Absolute: 0.1 10*3/uL (ref 0.0–0.1)
Basophils Relative: 1.1 % (ref 0.0–3.0)
Eosinophils Absolute: 0.2 10*3/uL (ref 0.0–0.7)
Eosinophils Relative: 4 % (ref 0.0–5.0)
HCT: 42.5 % (ref 36.0–46.0)
Hemoglobin: 14.5 g/dL (ref 12.0–15.0)
Lymphocytes Relative: 27.5 % (ref 12.0–46.0)
Lymphs Abs: 1.5 10*3/uL (ref 0.7–4.0)
MCHC: 34.1 g/dL (ref 30.0–36.0)
MCV: 93.7 fl (ref 78.0–100.0)
Monocytes Absolute: 0.4 10*3/uL (ref 0.1–1.0)
Monocytes Relative: 7.4 % (ref 3.0–12.0)
Neutro Abs: 3.2 10*3/uL (ref 1.4–7.7)
Neutrophils Relative %: 60 % (ref 43.0–77.0)
Platelets: 285 10*3/uL (ref 150.0–400.0)
RBC: 4.54 Mil/uL (ref 3.87–5.11)
RDW: 13.7 % (ref 11.5–15.5)
WBC: 5.3 10*3/uL (ref 4.0–10.5)

## 2019-04-02 LAB — MAGNESIUM: Magnesium: 2.2 mg/dL (ref 1.5–2.5)

## 2019-04-02 LAB — LIPID PANEL
Cholesterol: 236 mg/dL — ABNORMAL HIGH (ref 0–200)
HDL: 44.8 mg/dL (ref >39.00)
LDL Cholesterol: 164 mg/dL — ABNORMAL HIGH (ref 0–99)
NonHDL: 191.49
Total CHOL/HDL Ratio: 5
Triglycerides: 138 mg/dL (ref 0.0–149.0)
VLDL: 27.6 mg/dL (ref 0.0–40.0)

## 2019-04-02 LAB — COMPREHENSIVE METABOLIC PANEL
ALT: 8 U/L (ref 0–35)
AST: 13 U/L (ref 0–37)
Albumin: 4.5 g/dL (ref 3.5–5.2)
Alkaline Phosphatase: 99 U/L (ref 39–117)
BUN: 7 mg/dL (ref 6–23)
CO2: 27 mEq/L (ref 19–32)
Calcium: 9.2 mg/dL (ref 8.4–10.5)
Chloride: 103 mEq/L (ref 96–112)
Creatinine, Ser: 0.67 mg/dL (ref 0.40–1.20)
GFR: 106.55 mL/min (ref >60.00)
Glucose, Bld: 82 mg/dL (ref 70–99)
Potassium: 3.9 mEq/L (ref 3.5–5.1)
Sodium: 139 mEq/L (ref 135–145)
Total Bilirubin: 0.6 mg/dL (ref 0.2–1.2)
Total Protein: 7.7 g/dL (ref 6.0–8.3)

## 2019-04-02 LAB — VITAMIN B12: Vitamin B-12: 223 pg/mL (ref 211–911)

## 2019-04-02 LAB — VITAMIN D 25 HYDROXY (VIT D DEFICIENCY, FRACTURES): VITD: <7 ng/mL — ABNORMAL LOW (ref 30.00–100.00)

## 2019-04-02 MED ORDER — ALBUTEROL SULFATE HFA 108 (90 BASE) MCG/ACT IN AERS: 2.0000 | INHALATION_SPRAY | Freq: Four times a day (QID) | RESPIRATORY_TRACT | 1 refills | Status: DC | PRN

## 2019-04-02 MED ORDER — AMLODIPINE BESYLATE 5 MG PO TABS: 5.0000 mg | ORAL_TABLET | Freq: Two times a day (BID) | ORAL | 1 refills | Status: DC

## 2019-04-02 MED ORDER — SERTRALINE HCL 100 MG PO TABS: 100.0000 mg | ORAL_TABLET | Freq: Every day | ORAL | 1 refills | Status: DC

## 2019-04-02 MED ORDER — EPINEPHRINE 0.3 MG/0.3ML IJ SOAJ: 0.3000 mg | INTRAMUSCULAR | 1 refills | Status: DC | PRN

## 2019-04-02 NOTE — Progress Notes (Signed)
Brooke Coleman is a 68 y.o. female is here for follow up.  History of Present Illness:   HPI: See Assessment and Plan section for Problem Based Charting of issues discussed today.   Health Maintenance Due  Topic Date Due  . Hepatitis C Screening  1950/09/15  . MAMMOGRAM  07/04/2018   Depression screen [facility] 2/9 04/02/2019 05/08/2017 11/07/2016  Decreased Interest 0 3 1  Down, Depressed, Hopeless 2 2 0  PHQ - 2 Score 2 5 1   Altered sleeping 3 3 3   Tired, decreased energy 1 3 1   Change in appetite 2 1 0  Feeling bad or failure about yourself  2 0 0  Trouble concentrating 2 2 1   Moving slowly or fidgety/restless 0 0 0  Suicidal thoughts 0 0 0  PHQ-9 Score 12 14 6   Difficult doing work/chores Not difficult at all Very difficult -   PMHx, SurgHx, SocialHx, FamHx, Medications, and Allergies were reviewed in the Visit Navigator and updated as appropriate.   Patient Active Problem List   Diagnosis Date Noted  . Posterior vitreous detachment of both eyes 01/03/2018  . Chalazion of left lower eyelid 01/03/2018  . Drusen of macula, bilateral 01/03/2018  . Chronic insomnia 01/24/2017  . Gastroesophageal reflux disease without esophagitis 11/07/2016  . Essential hypertension 11/07/2016  . Dysthymia 11/07/2016  . Tremor 07/27/2015  . Carpal tunnel syndrome on left 06/04/2015  . Carpal tunnel syndrome on right 06/04/2015  . Cervical spondylosis without myelopathy 06/04/2015  . Primary osteoarthritis of first carpometacarpal joint of left hand 06/04/2015  . Primary osteoarthritis of first carpometacarpal joint of right hand 06/04/2015  . 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 Primary osteoarthritis of right wrist 06/04/2015   Social History   Tobacco Use  . Smoking status: Former Smoker    Quit date: 06/12/2005    Years since quitting: 13.8  . Smokeless tobacco: Never Used  Substance Use Topics  . Alcohol use: No  . Drug use: No   Current Medications and Allergies     Current Outpatient Medications:  .  albuterol (PROVENTIL HFA;VENTOLIN HFA) 108 (90 Base) MCG/ACT inhaler, Inhale 2 puffs into the lungs every 6 (six) hours as needed for wheezing or shortness of breath., Disp: 18 g, Rfl: 1 .  amLODipine (NORVASC) 5 MG tablet, Take 1 tablet (5 mg total) by mouth 2 (two) times daily., Disp: 30 tablet, Rfl: 0 .  aspirin 81 MG tablet, Take 81 mg by mouth daily., Disp: , Rfl:  .  benzonatate (TESSALON) 100 MG capsule, Take 1 capsule (100 mg total) by mouth 3 (three) times daily as needed for cough., Disp: 20 capsule, Rfl: 1 .  diphenhydrAMINE (BENADRYL) 25 mg capsule, Take 50 mg by mouth at bedtime. Takes 1/2 tablet at bedtime. Could not take during the day because it very tired , Disp: , Rfl:  .  ranitidine (ZANTAC) 150 MG tablet, Take 1 tablet (150 mg total) by mouth 2 (two) times daily., Disp: 90 tablet, Rfl: 3 .  sertraline (ZOLOFT) 100 MG tablet, Take 1 tablet (100 mg total) by mouth daily., Disp: 90 tablet, Rfl: 1   Allergies  Allergen Reactions  . Cortisone Anaphylaxis  . Erythromycin Base Anaphylaxis  . Neurontin [Gabapentin] Swelling  . Eggs Or Egg-Derived Products   . Flexeril [Cyclobenzaprine]     "pinching in chest"  . Naproxen     "pinching in chest"   Review of Systems   Pertinent items are noted in the HPI.  Otherwise, a complete ROS is negative.  Vitals   Vitals:   04/02/19 1030  BP: 134/78  Pulse: 87  Temp: 98.3 F (36.8 C)  TempSrc: Temporal  SpO2: 95%  Weight: 257 lb (116.6 kg)  Height: 5' 11.5" (1.816 m)     Body mass index is 35.34 kg/m.  Physical Exam   Physical Exam Vitals signs and nursing note reviewed.  HENT:     Head: Normocephalic and atraumatic.  Eyes:     Pupils: Pupils are equal, round, and reactive to light.  Neck:     Musculoskeletal: Normal range of motion and neck supple.  Cardiovascular:     Rate and Rhythm: Normal rate and regular rhythm.     Heart sounds: Normal heart sounds.  Pulmonary:      Effort: Pulmonary effort is normal.  Abdominal:     Palpations: Abdomen is soft.  Skin:    General: Skin is warm.  Neurological:     General: No focal deficit present.     Motor: Tremor present.  Psychiatric:        Behavior: Behavior normal.    Assessment and Plan   Sparkle was seen today for medication refill.  Diagnoses and all orders for this visit:  Essential hypertension -     amLODipine (NORVASC) 5 MG tablet; Take 1 tablet (5 mg total) by mouth 2 (two) times daily. -     CBC with Differential/Platelet -     Comprehensive metabolic panel -     Lipid panel -     Magnesium  Cough -     albuterol (VENTOLIN HFA) 108 (90 Base) MCG/ACT inhaler; Inhale 2 puffs into the lungs every 6 (six) hours as needed for wheezing or shortness of breath.  Dysthymia Comments: COVID slump. Worsened by chronic pain.  Orders: -     sertraline (ZOLOFT) 100 MG tablet; Take 1 tablet (100 mg total) by mouth daily.  B12 deficiency -     Vitamin B12  Vitamin D deficiency -     VITAMIN D 25 Hydroxy (Vit-D Deficiency, Fractures)  Tremor Comments: Brain imaging not done. Pateint not sure if test is worth it. Followed by Neuro.  Other orders -     EPINEPHrine 0.3 mg/0.3 mL IJ SOAJ injection; Inject 0.3 mLs (0.3 mg total) into the muscle as needed for anaphylaxis.    . Orders and follow up as documented in [place], reviewed diet, exercise and weight control, cardiovascular risk and specific lipid/LDL goals reviewed, reviewed medications and side effects in detail.  . Reviewed expectations re: course of current medical issues. . Outlined signs and symptoms indicating need for more acute intervention. . Patient verbalized understanding and all questions were answered. . Patient received an After Visit Summary.  Briscoe Deutscher, DO [place], [facility] 04/04/2019

## 2019-04-02 NOTE — Patient Instructions (Addendum)
Brooke Coleman , Thank you for taking time to come for your Medicare Wellness Visit. I appreciate your ongoing commitment to your health goals. Please review the following plan we discussed and let me know if I can assist you in the future.   Screening recommendations/referrals: Colorectal Screening: up to date; last 11/10/13 Mammogram: recommended  Bone Density: up to date  Vision and Dental Exams: Recommended annual ophthalmology exams for early detection of glaucoma and other disorders of the eye Recommended annual dental exams for proper oral hygiene  Vaccinations: Influenza vaccine:  recommended this fall either at PCP office or through your local pharmacy  Pneumococcal vaccine: recommended  Tdap vaccine: recommended  Shingles vaccine: Please call your insurance company to determine your out of pocket expense for the Shingrix vaccine. You may receive this vaccine at your local pharmacy.  Advanced directives: Advance directives discussed with you today. I have provided a copy for you to complete at home and have notarized. Once this is complete please bring a copy in to our office so we can scan it into your chart.  Goals: Recommend to drink at least 6-8 8oz glasses of water per day and consume a balanced diet rich in fresh fruits and vegetables.   Next appointment: Please schedule your Annual Wellness Visit with your Nurse Health Advisor in one year.  Preventive Care 46 Years and Older, Female Preventive care refers to lifestyle choices and visits with your health care provider that can promote health and wellness. What does preventive care include?  A yearly physical exam. This is also called an annual well check.  Dental exams once or twice a year.  Routine eye exams. Ask your health care provider how often you should have your eyes checked.  Personal lifestyle choices, including:  Daily care of your teeth and gums.  Regular physical activity.  Eating a healthy diet.   Avoiding tobacco and drug use.  Limiting alcohol use.  Practicing safe sex.  Taking low-dose aspirin every day if recommended by your health care provider.  Taking vitamin and mineral supplements as recommended by your health care provider. What happens during an annual well check? The services and screenings done by your health care provider during your annual well check will depend on your age, overall health, lifestyle risk factors, and family history of disease. Counseling  Your health care provider may ask you questions about your:  Alcohol use.  Tobacco use.  Drug use.  Emotional well-being.  Home and relationship well-being.  Sexual activity.  Eating habits.  History of falls.  Memory and ability to understand (cognition).  Work and work Statistician.  Reproductive health. Screening  You may have the following tests or measurements:  Height, weight, and BMI.  Blood pressure.  Lipid and cholesterol levels. These may be checked every 5 years, or more frequently if you are over 25 years old.  Skin check.  Lung cancer screening. You may have this screening every year starting at age 26 if you have a 30-pack-year history of smoking and currently smoke or have quit within the past 15 years.  Fecal occult blood test (FOBT) of the stool. You may have this test every year starting at age 101.  Flexible sigmoidoscopy or colonoscopy. You may have a sigmoidoscopy every 5 years or a colonoscopy every 10 years starting at age 37.  Hepatitis C blood test.  Hepatitis B blood test.  Sexually transmitted disease (STD) testing.  Diabetes screening. This is done by checking your blood sugar (  glucose) after you have not eaten for a while (fasting). You may have this done every 1-3 years.  Bone density scan. This is done to screen for osteoporosis. You may have this done starting at age 49.  Mammogram. This may be done every 1-2 years. Talk to your health care provider  about how often you should have regular mammograms. Talk with your health care provider about your test results, treatment options, and if necessary, the need for more tests. Vaccines  Your health care provider may recommend certain vaccines, such as:  Influenza vaccine. This is recommended every year.  Tetanus, diphtheria, and acellular pertussis (Tdap, Td) vaccine. You may need a Td booster every 10 years.  Zoster vaccine. You may need this after age 46.  Pneumococcal 13-valent conjugate (PCV13) vaccine. One dose is recommended after age 27.  Pneumococcal polysaccharide (PPSV23) vaccine. One dose is recommended after age 21. Talk to your health care provider about which screenings and vaccines you need and how often you need them. This information is not intended to replace advice given to you by your health care provider. Make sure you discuss any questions you have with your health care provider. Document Released: 06/25/2015 Document Revised: 02/16/2016 Document Reviewed: 03/30/2015 Elsevier Interactive Patient Education  2017 [employer] Prevention in the Home Falls can cause injuries. They can happen to people of all ages. There are many things you can do to make your home safe and to help prevent falls. What can I do on the outside of my home?  Regularly fix the edges of walkways and driveways and fix any cracks.  Remove anything that might make you trip as you walk through a door, such as a raised step or threshold.  Trim any bushes or trees on the path to your home.  Use bright outdoor lighting.  Clear any walking paths of anything that might make someone trip, such as rocks or tools.  Regularly check to see if handrails are loose or broken. Make sure that both sides of any steps have handrails.  Any raised decks and porches should have guardrails on the edges.  Have any leaves, snow, or ice cleared regularly.  Use sand or salt on walking paths during winter.   Clean up any spills in your garage right away. This includes oil or grease spills. What can I do in the bathroom?  Use night lights.  Install grab bars by the toilet and in the tub and shower. Do not use towel bars as grab bars.  Use non-skid mats or decals in the tub or shower.  If you need to sit down in the shower, use a plastic, non-slip stool.  Keep the floor dry. Clean up any water that spills on the floor as soon as it happens.  Remove soap buildup in the tub or shower regularly.  Attach bath mats securely with double-sided non-slip rug tape.  Do not have throw rugs and other things on the floor that can make you trip. What can I do in the bedroom?  Use night lights.  Make sure that you have a light by your bed that is easy to reach.  Do not use any sheets or blankets that are too big for your bed. They should not hang down onto the floor.  Have a firm chair that has side arms. You can use this for support while you get dressed.  Do not have throw rugs and other things on the floor that can make  you trip. What can I do in the kitchen?  Clean up any spills right away.  Avoid walking on wet floors.  Keep items that you use a lot in easy-to-reach places.  If you need to reach something above you, use a strong step stool that has a grab bar.  Keep electrical cords out of the way.  Do not use floor polish or wax that makes floors slippery. If you must use wax, use non-skid floor wax.  Do not have throw rugs and other things on the floor that can make you trip. What can I do with my stairs?  Do not leave any items on the stairs.  Make sure that there are handrails on both sides of the stairs and use them. Fix handrails that are broken or loose. Make sure that handrails are as long as the stairways.  Check any carpeting to make sure that it is firmly attached to the stairs. Fix any carpet that is loose or worn.  Avoid having throw rugs at the top or bottom of  the stairs. If you do have throw rugs, attach them to the floor with carpet tape.  Make sure that you have a light switch at the top of the stairs and the bottom of the stairs. If you do not have them, ask someone to add them for you. What else can I do to help prevent falls?  Wear shoes that:  Do not have high heels.  Have rubber bottoms.  Are comfortable and fit you well.  Are closed at the toe. Do not wear sandals.  If you use a stepladder:  Make sure that it is fully opened. Do not climb a closed stepladder.  Make sure that both sides of the stepladder are locked into place.  Ask someone to hold it for you, if possible.  Clearly mark and make sure that you can see:  Any grab bars or handrails.  First and last steps.  Where the edge of each step is.  Use tools that help you move around (mobility aids) if they are needed. These include:  Canes.  Walkers.  Scooters.  Crutches.  Turn on the lights when you go into a dark area. Replace any light bulbs as soon as they burn out.  Set up your furniture so you have a clear path. Avoid moving your furniture around.  If any of your floors are uneven, fix them.  If there are any pets around you, be aware of where they are.  Review your medicines with your doctor. Some medicines can make you feel dizzy. This can increase your chance of falling. Ask your doctor what other things that you can do to help prevent falls. This information is not intended to replace advice given to you by your health care provider. Make sure you discuss any questions you have with your health care provider. Document Released: 03/25/2009 Document Revised: 11/04/2015 Document Reviewed: 07/03/2014 Elsevier Interactive Patient Education  2017 [employer].

## 2019-04-02 NOTE — Patient Instructions (Addendum)
Vitamin D3 2000IU daily  Turmeric 500mg  daily  Tart cherry extract any dose at night   [facility]  Address: [id number], [place], [place] [address] Phone: [phone]  Dr. Carles Collet Address: [address], [place], [place] [address]  Phone: [phone]  [facility].  Address: [address], [place], [place] [address] Phone: [phone]

## 2019-04-02 NOTE — Progress Notes (Signed)
Subjective:   Brooke Coleman is a 68 y.o. female who presents for an Initial Medicare Annual Wellness Visit.  Review of Systems     Cardiac Risk Factors include: advanced age (>3men, >40 women);dyslipidemia;hypertension     Objective:    Today's Vitals   04/02/19 1110  BP: 134/78  Temp: 98.3 F (36.8 C)  TempSrc: Temporal  Weight: 257 lb 0.9 oz (116.6 kg)  Height: 5' 11.5" (1.816 m)   Body mass index is 35.35 kg/m.  Advanced Directives 04/02/2019 07/27/2015  Does Patient Have a Medical Advance Directive? No No  Would patient like information on creating a medical advance directive? Yes (MAU/Ambulatory/Procedural Areas - Information given) -    Current Medications (verified) Outpatient Encounter Medications as of 04/02/2019  Medication Sig  . aspirin 81 MG tablet Take 81 mg by mouth daily.  . diphenhydrAMINE (BENADRYL) 25 mg capsule Take 50 mg by mouth at bedtime. Takes 1/2 tablet at bedtime. Could not take during the day because it very tired   . [DISCONTINUED] albuterol (PROVENTIL HFA;VENTOLIN HFA) 108 (90 Base) MCG/ACT inhaler Inhale 2 puffs into the lungs every 6 (six) hours as needed for wheezing or shortness of breath.  . [DISCONTINUED] amLODipine (NORVASC) 5 MG tablet Take 1 tablet (5 mg total) by mouth 2 (two) times daily.  . [DISCONTINUED] sertraline (ZOLOFT) 100 MG tablet Take 1 tablet (100 mg total) by mouth daily.   No facility-administered encounter medications on file as of 04/02/2019.     Allergies (verified) Cortisone, Erythromycin base, Neurontin [gabapentin], Eggs or egg-derived products, Flexeril [cyclobenzaprine], and Naproxen   History: Past Medical History:  Diagnosis Date  . Cervical spondylosis   . Chronic pain   . Depression   . Heart murmur   . Hyperlipidemia   . Hypertension   . Tremors of nervous system    History reviewed. No pertinent surgical history. Family History  Problem Relation Age of Onset  . Pulmonary embolism Mother    . Hypertension Father   . Heart disease Father   . Lung disease Sister   . Syncope episode Sister   . Lung disease Brother   . Heart disease Brother   . Cancer - Lung Brother    Social History   Socioeconomic History  . Marital status: Divorced    Spouse name: Not on file  . Number of children: 2  . Years of education: AAS  . Highest education level: Not on file  Occupational History  . Occupation: retired  Scientific laboratory technician  . Financial resource strain: Not on file  . Food insecurity    Worry: Not on file    Inability: Not on file  . Transportation needs    Medical: Not on file    Non-medical: Not on file  Tobacco Use  . Smoking status: Former Smoker    Quit date: 06/12/2005    Years since quitting: 13.8  . Smokeless tobacco: Never Used  Substance and Sexual Activity  . Alcohol use: No  . Drug use: No  . Sexual activity: Not on file  Lifestyle  . Physical activity    Days per week: Not on file    Minutes per session: Not on file  . Stress: Not on file  Relationships  . Social Herbalist on phone: Not on file    Gets together: Not on file    Attends religious service: Not on file    Active member of club or organization: Not on file  Attends meetings of clubs or organizations: Not on file    Relationship status: Not on file  Other Topics Concern  . Not on file  Social History Narrative   Lives at home w/ her daughter   Patient drinks 1 cup of coffee daily.   Patient is left handed.     Tobacco Counseling Counseling given: Not Answered   Clinical Intake:  Pre-visit preparation completed: Yes  Pain : No/denies pain  Diabetes: No  How often do you need to have someone help you when you read instructions, pamphlets, or other written materials from your doctor or pharmacy?: 2 - Rarely  Interpreter Needed?: No  Information entered by :: Denman George LPN   Activities of Daily Living In your present state of health, do you have any  difficulty performing the following activities: 04/02/2019  Hearing? N  Vision? N  Difficulty concentrating or making decisions? N  Walking or climbing stairs? N  Dressing or bathing? N  Doing errands, shopping? N  Preparing Food and eating ? Y  Comment assisted by daughter with prep as needed  Using the Toilet? N  In the past six months, have you accidently leaked urine? N  Do you have problems with loss of bowel control? N  Managing your Medications? N  Managing your Finances? N  Housekeeping or managing your Housekeeping? Y  Comment assisted by daughter  Some recent data might be hidden     Immunizations and Health Maintenance  There is no immunization history on file for this patient. Health Maintenance Due  Topic Date Due  . Hepatitis C Screening  08-May-1951  . MAMMOGRAM  07/04/2018    Patient Care Team: Briscoe Deutscher, DO as PCP - General (Family Medicine) Marybelle Killings, MD as Consulting Physician (Orthopedic Surgery) Ashok Pall, MD as Consulting Physician (Neurosurgery) Hortencia Pilar, MD as Consulting Physician (Surgery) Kathrynn Ducking, MD as Consulting Physician (Neurology)  Indicate any recent Medical Services you may have received from other than [facility] providers in the past year (date may be approximate).     Assessment:   This is a routine wellness examination for [facility].  Hearing/Vision screen No exam data present  Dietary issues and exercise activities discussed: Current Exercise Habits: The patient does not participate in regular exercise at present  Goals   None    Depression Screen PHQ 2/9 Scores 04/02/2019 05/08/2017 11/07/2016  PHQ - 2 Score 2 5 1   PHQ- 9 Score 12 14 6     Fall Risk Fall Risk  07/27/2017  Falls in the past year? No    Is the patient's home free of loose throw rugs in walkways, pet beds, electrical cords, etc?   yes      Grab bars in the bathroom? yes      Handrails on the stairs?   yes      Adequate  lighting?   yes  Timed Get Up and Go Performed completed and within normal timeframe; no gait abnormalities noted   Cognitive Function: no cognitive concerns at this time      6CIT Screen 04/02/2019  What Year? 0 points  What month? 0 points  What time? 0 points  Count back from 20 0 points  Months in reverse 0 points  Repeat phrase 2 points  Total Score 2    Screening Tests Health Maintenance  Topic Date Due  . Hepatitis C Screening  06/17/1950  . MAMMOGRAM  07/04/2018  . TETANUS/TDAP  04/01/2020 (Originally 03/23/1972)  .  PNA vac Low Risk Adult (1 of 2 - PCV13) 04/01/2020 (Originally 03/23/2018)  . COLONOSCOPY  11/11/2023  . DEXA SCAN  Completed  . INFLUENZA VACCINE  Discontinued    Qualifies for Shingles Vaccine? Discussed and patient will check with pharmacy for coverage.  Patient education handout provided   Cancer Screenings: Lung: Low Dose CT Chest recommended if Age 35-80 years, 30 pack-year currently smoking OR have quit w/in 15years. Patient does not qualify. Breast: Up to date on Mammogram? Yes; ordered today  Up to date of Bone Density/Dexa? Yes Colorectal: colonoscopy 11/10/13 with Dr. Michail Sermon    Plan:  I have personally reviewed and addressed the Medicare Annual Wellness questionnaire and have noted the following in the patient's chart:  A. Medical and social history B. Use of alcohol, tobacco or illicit drugs  C. Current medications and supplements D. Functional ability and status E.  Nutritional status F.  Physical activity G. Advance directives H. List of other physicians I.  Hospitalizations, surgeries, and ER visits in previous 12 months J.  [place] such as hearing and vision if needed, cognitive and depression L. Referrals, records requested, and appointments- mammogram ordered   In addition, I have reviewed and discussed with patient certain preventive protocols, quality metrics, and best practice recommendations. A written  personalized care plan for preventive services as well as general preventive health recommendations were provided to patient.   Signed,  Denman George, LPN  Nurse Health Advisor   Nurse Notes: no additional

## 2019-04-03 NOTE — Progress Notes (Signed)
I have reviewed documentation for AWV and Advance Care planning provided by [facility], I agree with documentation, I was immediately available for any questions. Darryel Diodato, DO   

## 2019-04-04 ENCOUNTER — Other Ambulatory Visit: Payer: Self-pay

## 2019-04-04 MED ORDER — VITAMIN D (ERGOCALCIFEROL) 1.25 MG (50000 UNIT) PO CAPS: 50000.0000 [IU] | ORAL_CAPSULE | ORAL | 0 refills | Status: DC

## 2019-09-20 ENCOUNTER — Other Ambulatory Visit: Payer: Self-pay | Admitting: Family Medicine

## 2019-09-20 DIAGNOSIS — I10 Essential (primary) hypertension: Secondary | ICD-10-CM

## 2019-09-22 NOTE — Telephone Encounter (Signed)
Please review

## 2019-09-22 NOTE — Telephone Encounter (Signed)
Unable to leave a voice message for patient.

## 2019-09-22 NOTE — Telephone Encounter (Signed)
Call patient for appointment

## 2019-09-26 ENCOUNTER — Other Ambulatory Visit: Payer: Self-pay | Admitting: Family Medicine

## 2019-09-26 DIAGNOSIS — I10 Essential (primary) hypertension: Secondary | ICD-10-CM

## 2019-09-29 ENCOUNTER — Telehealth: Payer: Self-pay | Admitting: Family Medicine

## 2019-09-29 DIAGNOSIS — I10 Essential (primary) hypertension: Secondary | ICD-10-CM

## 2019-09-29 MED ORDER — AMLODIPINE BESYLATE 5 MG PO TABS: 5.0000 mg | ORAL_TABLET | Freq: Two times a day (BID) | ORAL | 0 refills | Status: DC

## 2019-09-29 NOTE — Telephone Encounter (Signed)
MEDICATION: amlodipine 5 MG  PHARMACY: [facility] [address]  Comments:   **Let patient know to contact pharmacy at the end of the day to make sure medication is ready. **  ** Please notify patient to allow 48-72 hours to process**  **Encourage patient to contact the pharmacy for refills or they can request refills through [facility]**

## 2019-09-29 NOTE — Telephone Encounter (Signed)
Script sent to pharmacy.

## 2019-11-16 IMAGING — DX DG SHOULDER 2+V*L*
3 series · 3 of 3 positions shown · non-contrast
Comparison: None.

CLINICAL DATA: Left arm pain

EXAM:
LEFT SHOULDER - 2+ VIEW

[shoulder grashey ap]
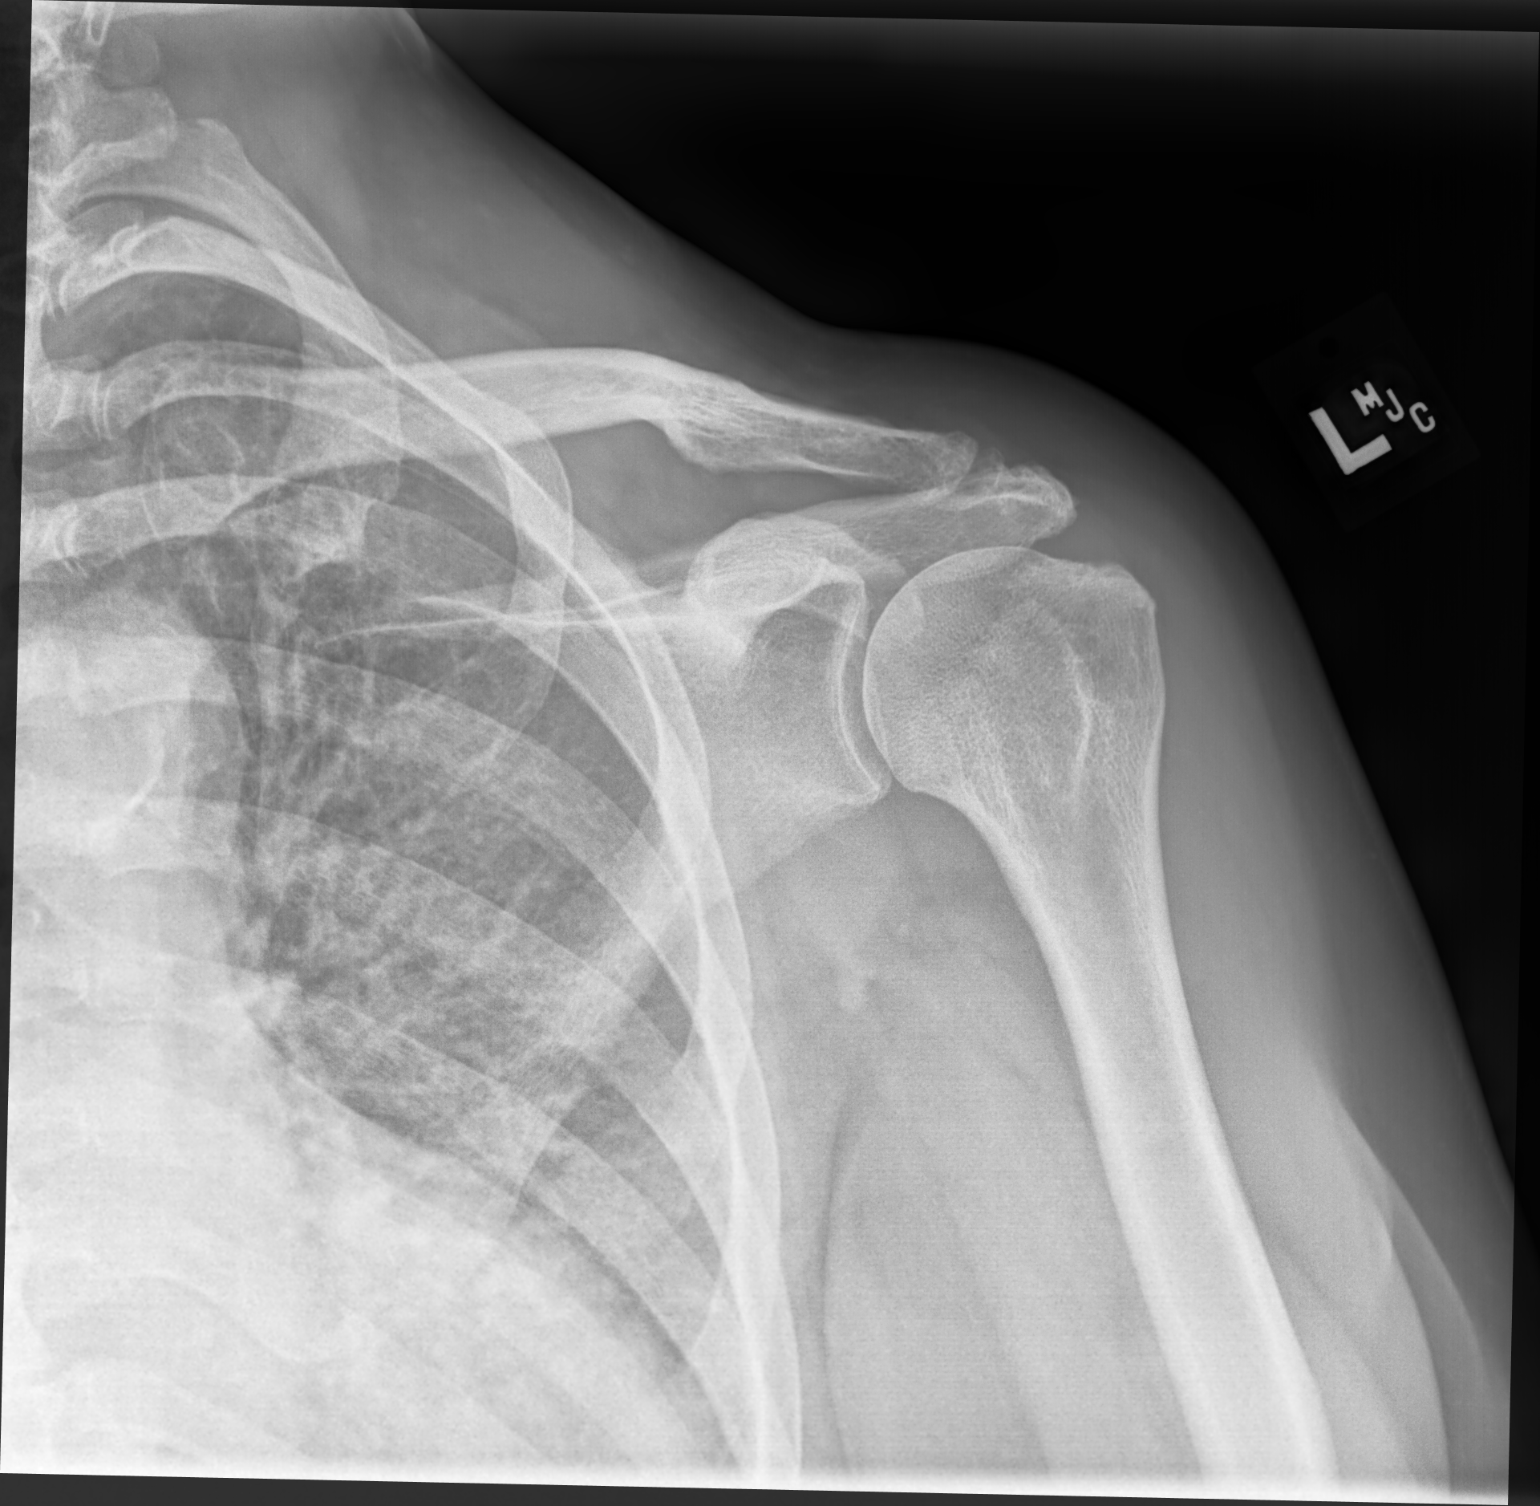

[shoulder y view]
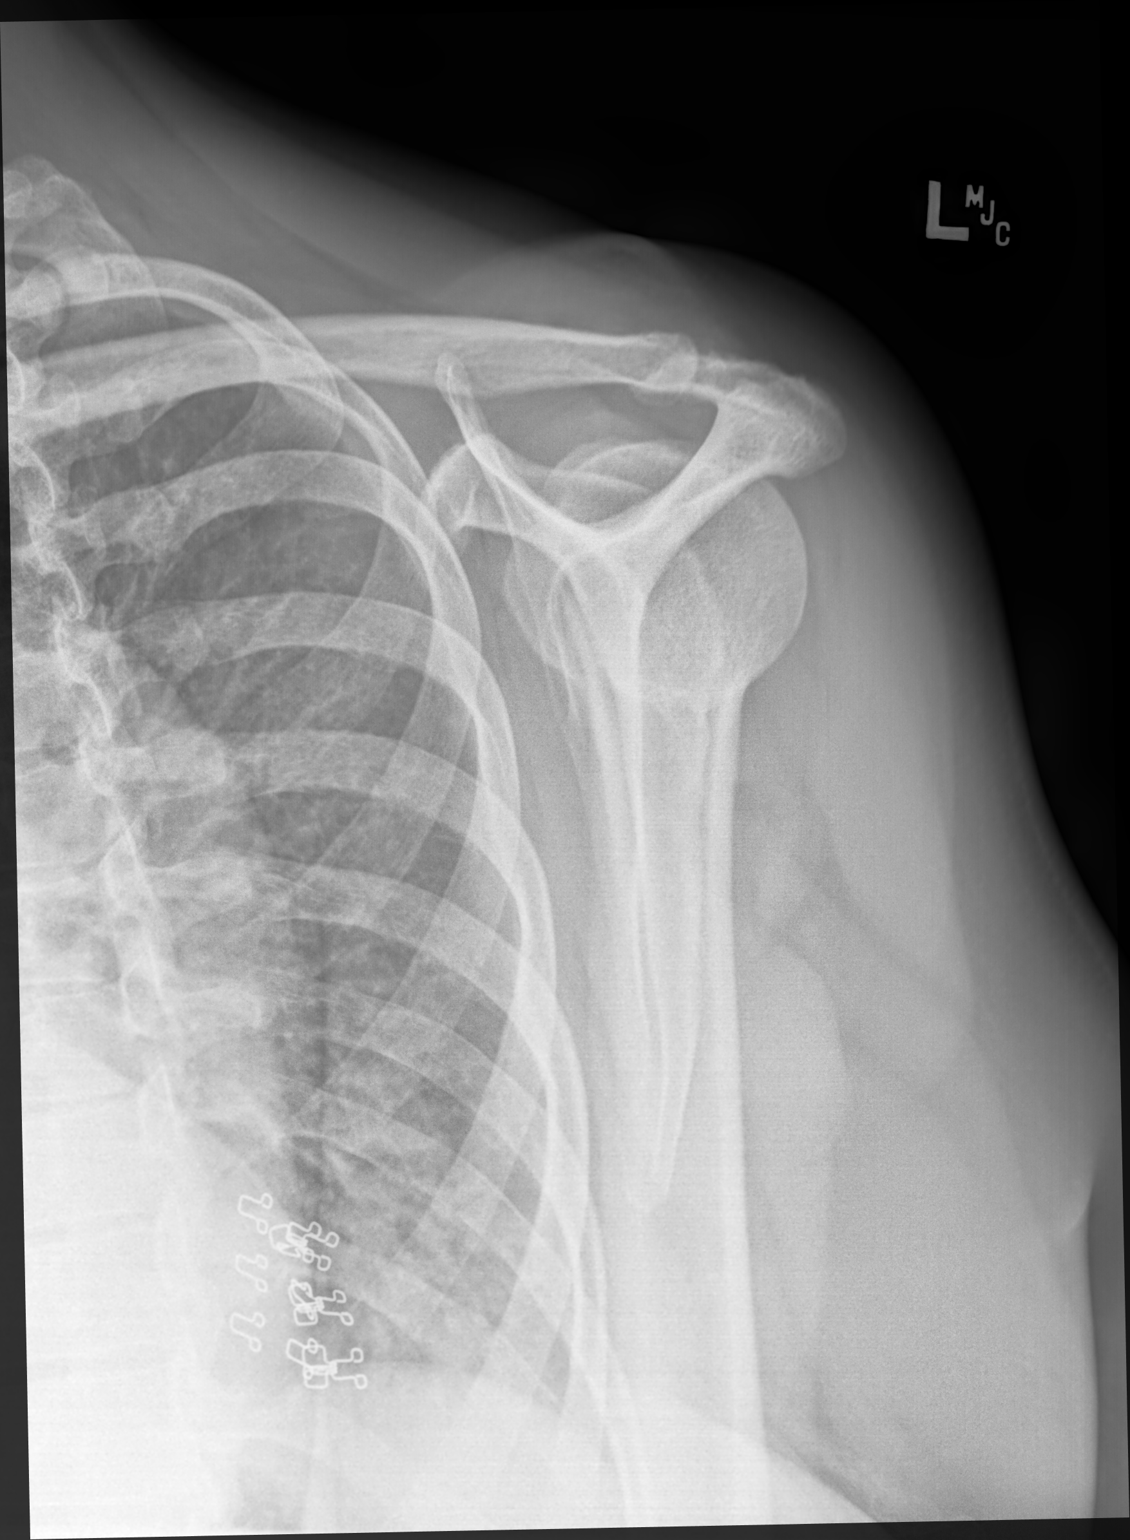

[shoulder axial]
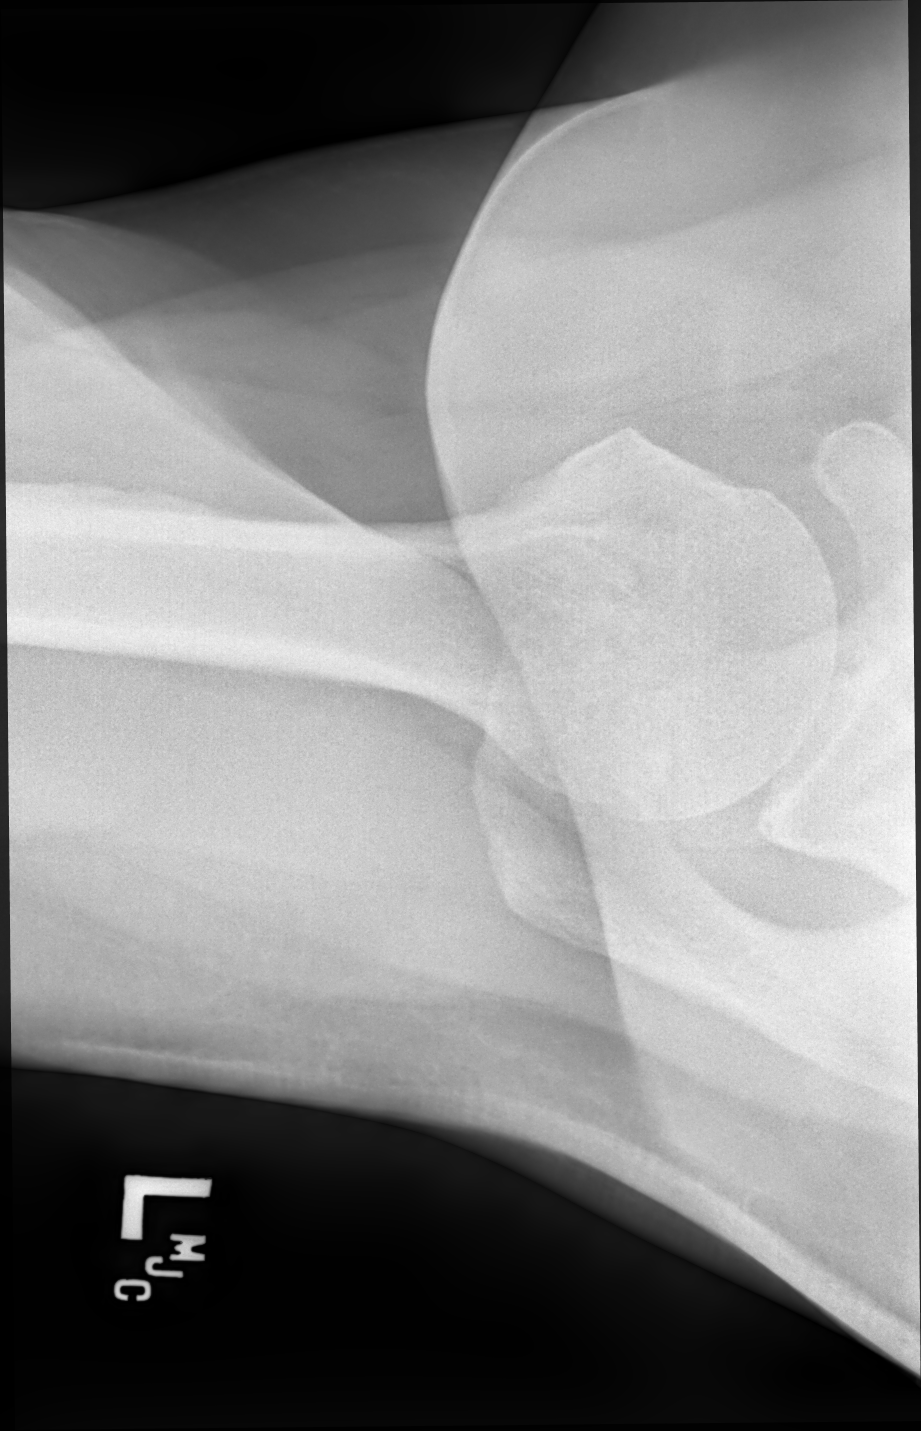

[3 of 3 positions shown; findings below may reference images not displayed]

FINDINGS: No acute displaced fracture or malalignment. Moderate degenerative
changes of the AC joint. The left lung apex is clear.
IMPRESSION: No acute osseous abnormality.  Degenerative changes of the AC joint.

## 2019-11-16 IMAGING — DX DG CERVICAL SPINE 2 OR 3 VIEWS
4 series · 4 of 4 positions shown · non-contrast
Comparison: None.

CLINICAL DATA: Neck pain

EXAM:
CERVICAL SPINE - 2-3 VIEW

[cervical spine lat (1 of 2)]
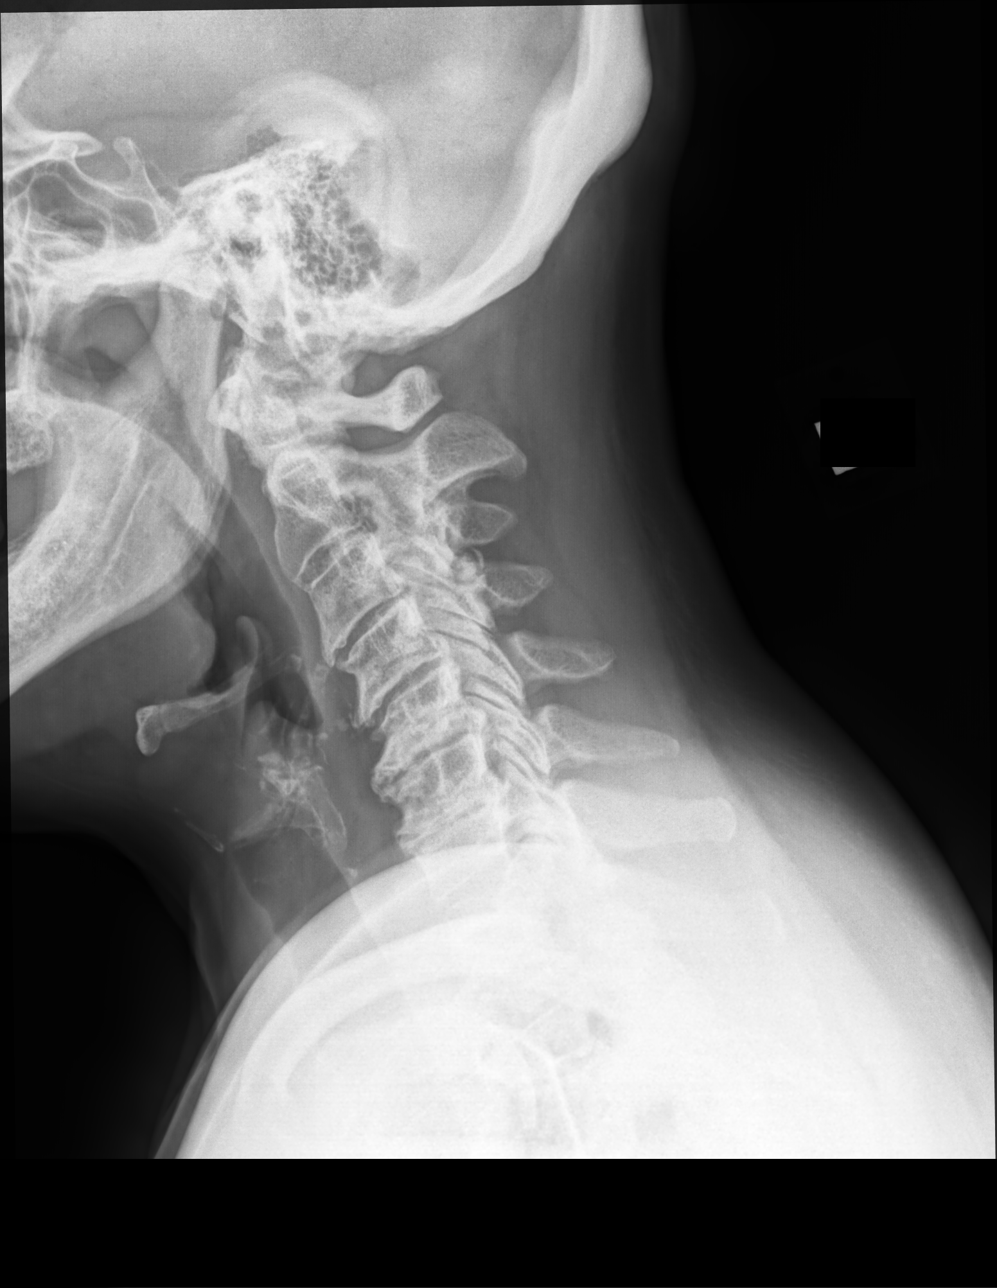

[cervical spine ap (1 of 2)]
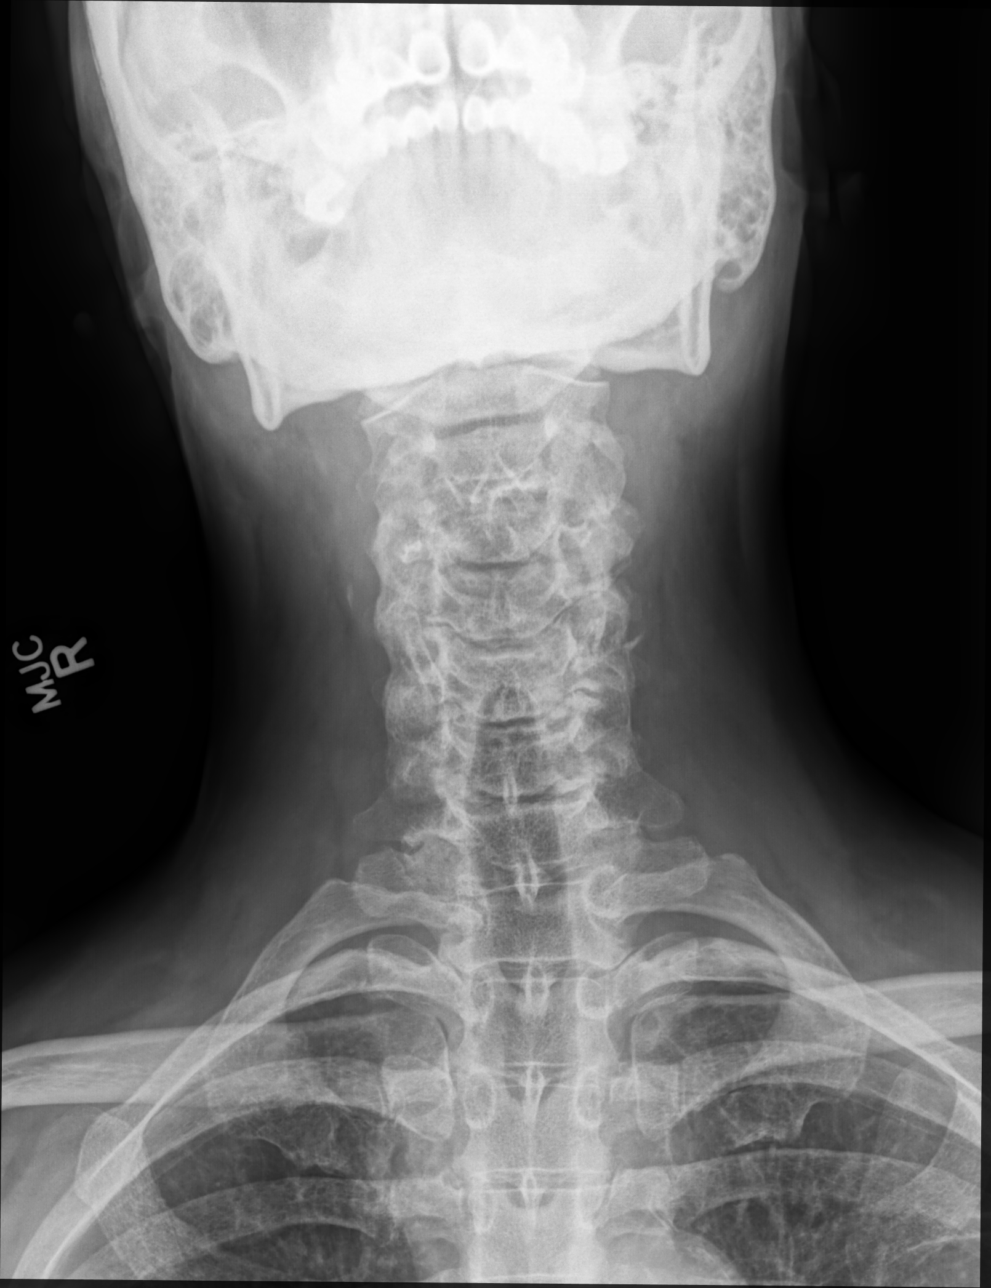

[cervical spine ap (2 of 2)]
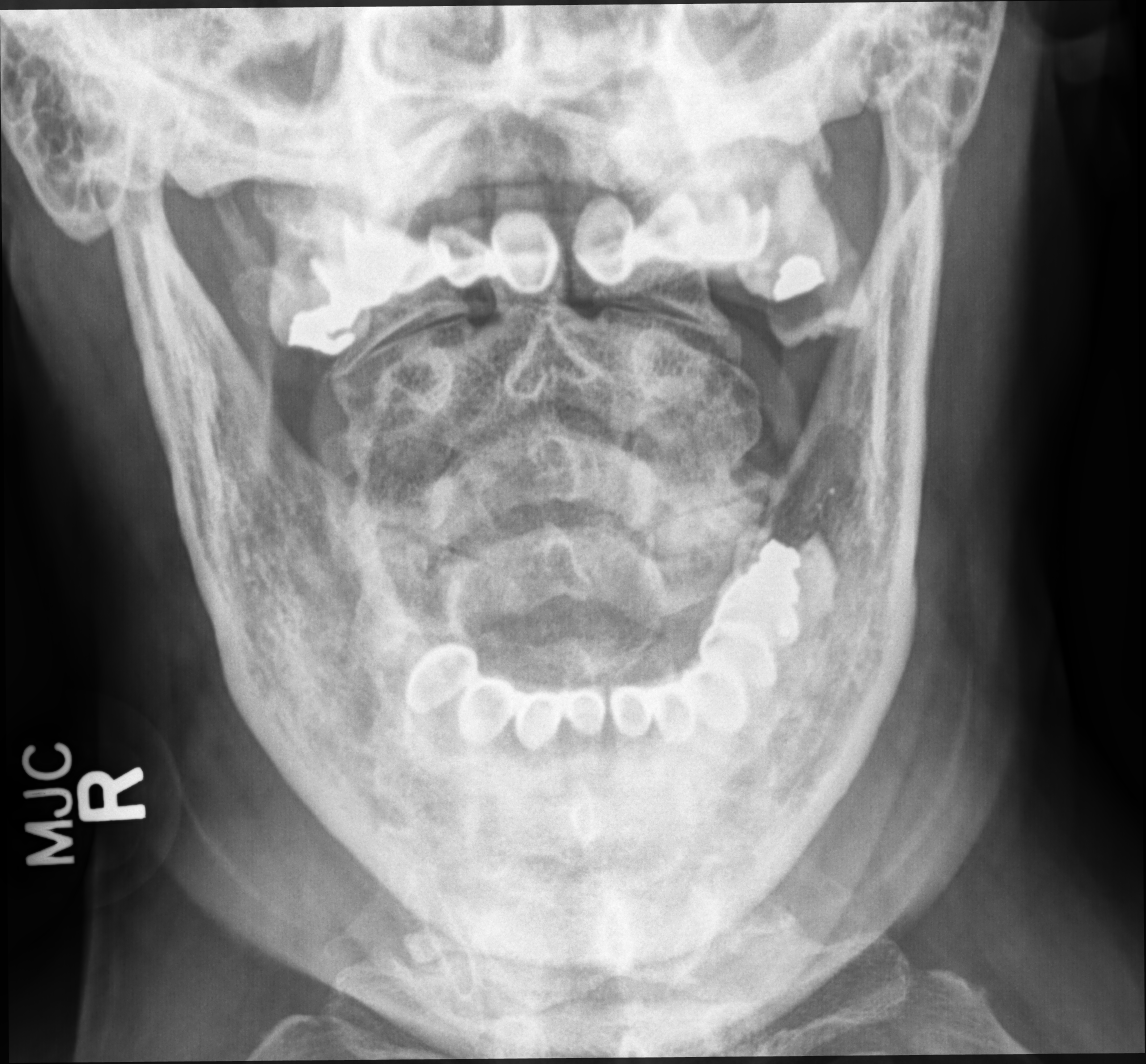

[cervical spine lat (2 of 2)]
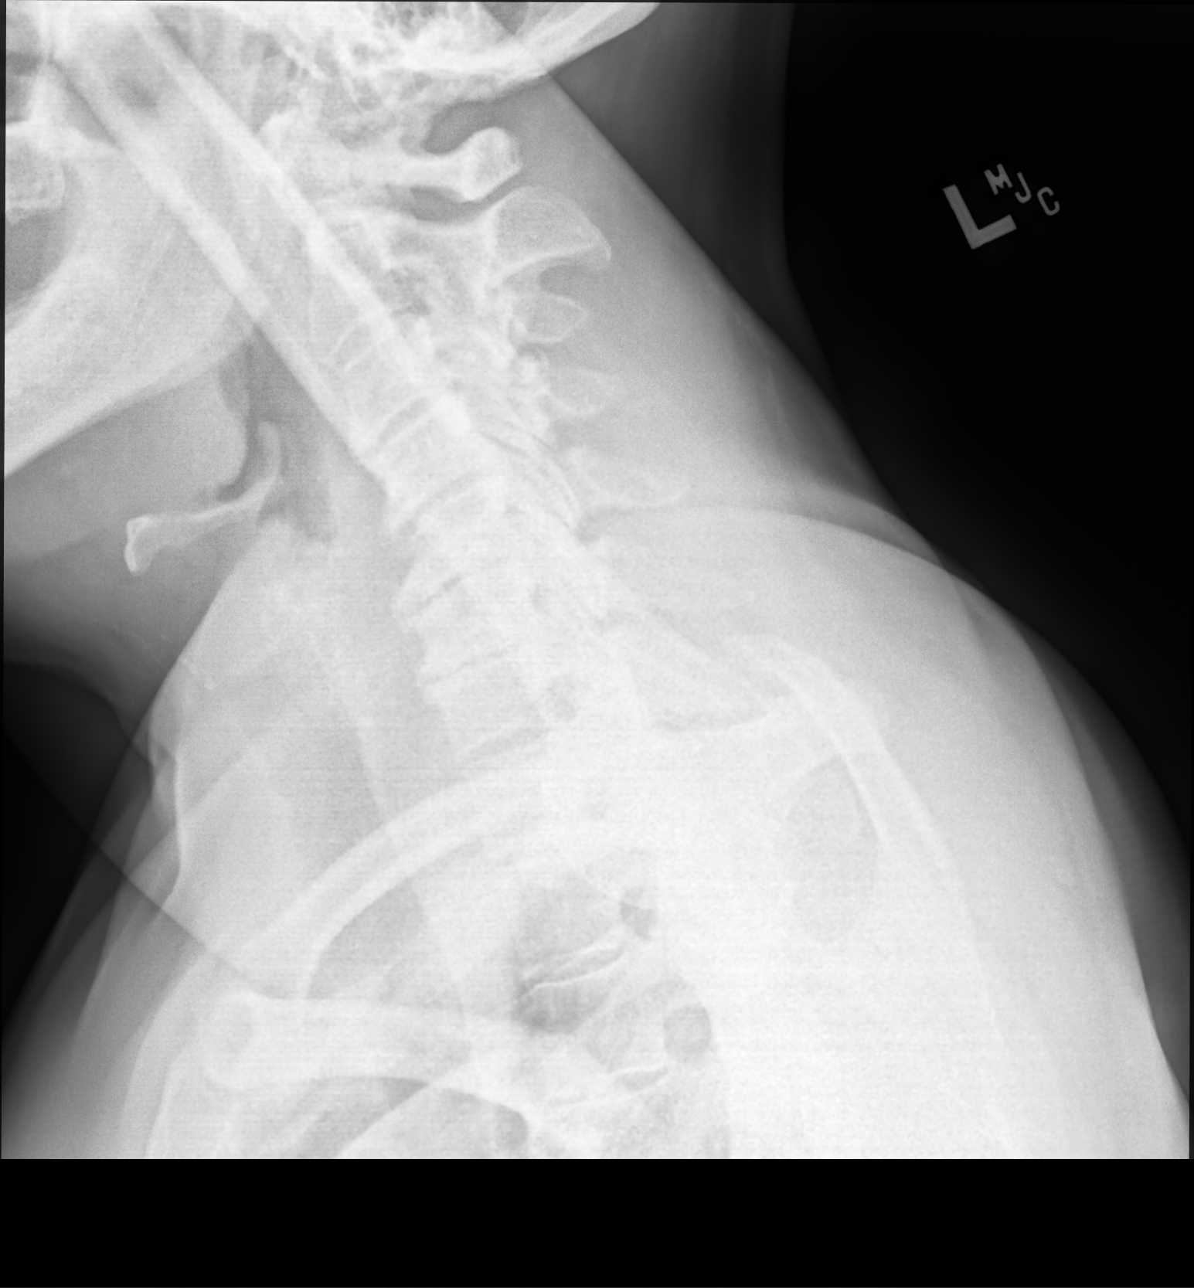

[4 of 4 positions shown; findings below may reference images not displayed]

FINDINGS: Mild reversal of cervical lordosis. Vertebral body heights are
normal. Prevertebral soft tissue thickness is normal. Dens and
lateral masses are unremarkable. Mild carotid vascular
calcification. Moderate diffuse degenerative changes C3 through C7
with disc space narrowing and anterior osteophytes. Mild
degenerative changes at C2-C3.
IMPRESSION: Reversal of cervical lordosis with moderate diffuse degenerative
changes of the cervical spine. No acute osseous abnormality

## 2020-01-14 ENCOUNTER — Encounter: Payer: Self-pay | Admitting: Family Medicine

## 2020-01-14 ENCOUNTER — Other Ambulatory Visit: Payer: Self-pay

## 2020-01-14 ENCOUNTER — Ambulatory Visit (INDEPENDENT_AMBULATORY_CARE_PROVIDER_SITE_OTHER): Payer: Medicare HMO | Admitting: Family Medicine

## 2020-01-14 VITALS — BP 142/80 | HR 99 | Temp 98.7°F | Resp 18 | Ht 72.0 in | Wt 250.0 lb

## 2020-01-14 DIAGNOSIS — J4520 Mild intermittent asthma, uncomplicated: Secondary | ICD-10-CM

## 2020-01-14 DIAGNOSIS — Z0000 Encounter for general adult medical examination without abnormal findings: Secondary | ICD-10-CM

## 2020-01-14 DIAGNOSIS — E559 Vitamin D deficiency, unspecified: Secondary | ICD-10-CM | POA: Diagnosis not present

## 2020-01-14 DIAGNOSIS — M542 Cervicalgia: Secondary | ICD-10-CM | POA: Insufficient documentation

## 2020-01-14 DIAGNOSIS — G8929 Other chronic pain: Secondary | ICD-10-CM

## 2020-01-14 DIAGNOSIS — Z1231 Encounter for screening mammogram for malignant neoplasm of breast: Secondary | ICD-10-CM

## 2020-01-14 DIAGNOSIS — M19041 Primary osteoarthritis, right hand: Secondary | ICD-10-CM | POA: Insufficient documentation

## 2020-01-14 DIAGNOSIS — M19031 Primary osteoarthritis, right wrist: Secondary | ICD-10-CM | POA: Insufficient documentation

## 2020-01-14 DIAGNOSIS — Z780 Asymptomatic menopausal state: Secondary | ICD-10-CM | POA: Diagnosis not present

## 2020-01-14 DIAGNOSIS — R251 Tremor, unspecified: Secondary | ICD-10-CM | POA: Diagnosis not present

## 2020-01-14 DIAGNOSIS — E538 Deficiency of other specified B group vitamins: Secondary | ICD-10-CM | POA: Insufficient documentation

## 2020-01-14 DIAGNOSIS — F341 Dysthymic disorder: Secondary | ICD-10-CM | POA: Diagnosis not present

## 2020-01-14 DIAGNOSIS — I10 Essential (primary) hypertension: Secondary | ICD-10-CM

## 2020-01-14 HISTORY — DX: Other chronic pain: G89.29

## 2020-01-14 HISTORY — DX: Mild intermittent asthma, uncomplicated: J45.20

## 2020-01-14 HISTORY — DX: Cervicalgia: M54.2

## 2020-01-14 MED ORDER — AMLODIPINE BESYLATE 10 MG PO TABS: 10.0000 mg | ORAL_TABLET | Freq: Every day | ORAL | 3 refills | Status: DC

## 2020-01-14 MED ORDER — SERTRALINE HCL 100 MG PO TABS: 100.0000 mg | ORAL_TABLET | Freq: Every day | ORAL | 3 refills | Status: DC

## 2020-01-14 NOTE — Progress Notes (Signed)
Subjective  Chief Complaint  Patient presents with  . Transitions Of Care  . Hypertension    HPI: Brooke Coleman is a 69 y.o. female who presents to [facility] [facility] [place] today for a Female Wellness Visit. She also has the concerns and/or needs as listed above in the chief complaint. These will be addressed in addition to the Health Maintenance Visit. TOC from Dr. Juleen China. Last visit 03/2019 Reviewed multiple medical records from neuro, ortho, prior pcp. Reviewed labs and imaging results. Updated PL in detail  Wellness Visit: annual visit with health maintenance review and exam without Pap   HM: overdue mammo and dexa: last dexa 2015 and normal. CRC screen up to date. Imms: allergy to eggs: doesn't take flu vaccine. Got covid vaccines but refuses pneumonia vaccinations.   Lives with son and his family; separated from husband. Has 2 grown children.  Chronic disease f/u and/or acute problem visit: (deemed necessary to be done in addition to the wellness visit):  Tremor: essential and/or parkinson's: Dr. Jannifer Franklin working up pt; gait is ok but has significant tremor at rest. Limits some functional capabilities. No falls. Memory ok. Masked face is present. Hasn't had Datscan due to fear of allergy to dye. No meds have been tried as of yet.  HTN: on amlodipine 5mg  bid. Reports control is typically good but increases with pain.   Chronic pain: neck; evalauted by NS and ortho. Tries to manage behaviorally; wants to avoid pain meds as much as possible. No radiculopathy  OA in hands,wrists, neck and back. Has bilateral carpal tunnel  Dysthymia managed on zoloft 100. Says she feels stable. Needs refill. No AEs.  Depression screen [facility] 2/9 01/14/2020 04/02/2019 05/08/2017 11/07/2016  Decreased Interest 3 0 3 1  Down, Depressed, Hopeless 3 2 2  0  PHQ - 2 Score 6 2 5 1   Altered sleeping 0 3 3 3   Tired, decreased energy 0 1 3 1   Change in appetite 0 2 1 0  Feeling bad or failure  about yourself  0 2 0 0  Trouble concentrating 0 2 2 1   Moving slowly or fidgety/restless 0 0 0 0  Suicidal thoughts - 0 0 0  PHQ-9 Score 6 12 14 6   Difficult doing work/chores Somewhat difficult Not difficult at all Very difficult -    Vit D and b12 deficiencies on last labs; reviewed. On intermittent supplements. Due for recheck  Reports adult mild intermittent asthma. Would like albuterol refilled. Rare use. Typically with URI's. Non smoker.   Assessment  1. Annual physical exam   2. Tremor   3. Essential hypertension   4. Dysthymia   5. Screening mammogram, encounter for   6. Asymptomatic menopausal state   7. Vitamin D deficiency   8. Vitamin B12 deficiency   9. Mild intermittent asthma without complication   10. Chronic neck pain      Plan  Female Wellness Visit:  Age appropriate Health Maintenance and Prevention measures were discussed with patient. Included topics are cancer screening recommendations, ways to keep healthy (see AVS) including dietary and exercise recommendations, regular eye and dental care, use of seat belts, and avoidance of moderate alcohol use and tobacco use. Ordered mammo and dexa.   BMI: discussed patient's BMI and encouraged positive lifestyle modifications to help get to or maintain a target BMI.  HM needs and immunizations were addressed and ordered. See below for orders. See HM and immunization section for updates. Refuses vaccines. Counseling provided  Routine  labs and screening tests ordered including cmp, cbc and lipids where appropriate.  Discussed recommendations regarding Vit D and calcium supplementation (see AVS)  Chronic disease management visit and/or acute problem visit:  HTN: fair control. ? Pain response. Change to amlodipine 10daily (same dose) and recheck in 3 months. Would adjust meds if control remains borderline.   Chronic pain: per ortho  Recheck vitamin levels.   Dysthymia: refilled sertraline. improved. Monitor.    Asthma, MI: refilled inhaler  Tremor: per neuro.   Follow up: 3 months for BP f/u  Orders Placed This Encounter  Procedures  . DG Bone Density  . MM DIGITAL SCREENING BILATERAL  . VITAMIN D 25 Hydroxy (Vit-D Deficiency, Fractures)  . Vitamin B12  . CBC with Differential/Platelet  . Comprehensive metabolic panel  . Lipid panel  . TSH  . Hepatitis C antibody   Meds ordered this encounter  Medications  . amLODipine (NORVASC) 10 MG tablet    Sig: Take 1 tablet (10 mg total) by mouth daily.    Dispense:  90 tablet    Refill:  3    Please consider 90 day supplies to promote better adherence  . sertraline (ZOLOFT) 100 MG tablet    Sig: Take 1 tablet (100 mg total) by mouth daily.    Dispense:  90 tablet    Refill:  3      Lifestyle: Body mass index is 33.91 kg/m. Wt Readings from Last 3 Encounters:  01/14/20 250 lb (113.4 kg)  04/02/19 257 lb 0.9 oz (116.6 kg)  04/02/19 257 lb (116.6 kg)     Patient Active Problem List   Diagnosis Date Noted  . Chronic insomnia 01/24/2017    Priority: High  . Essential hypertension 11/07/2016    Priority: High  . Dysthymia 11/07/2016    Priority: High  . Tremor 07/27/2015    Priority: High    Essential vs parkinsons's: eval 2021 neuro   . Cervical spondylosis without myelopathy 06/04/2015    Priority: High  . Primary osteoarthritis of both hands 01/14/2020    Priority: Medium  . Primary osteoarthritis of both wrists 01/14/2020    Priority: Medium  . Mild intermittent asthma 01/14/2020    Priority: Medium  . Gastroesophageal reflux disease without esophagitis 11/07/2016    Priority: Medium  . Bilateral carpal tunnel syndrome 06/04/2015    Priority: Medium  . Vitamin D deficiency 01/14/2020    Priority: Low  . Vitamin B12 deficiency 01/14/2020    Priority: Low  . Drusen of macula, bilateral 01/03/2018    Priority: Low  . Chronic neck pain 01/14/2020    Evaluated by ortho and NS: offered surgery but not clearly going  to be helpful. Pt defers.     Health Maintenance  Topic Date Due  . Hepatitis C Screening  Never done  . MAMMOGRAM  07/04/2017  . DEXA SCAN  11/26/2018  . TETANUS/TDAP  04/01/2020 (Originally 03/23/1972)  . PNA vac Low Risk Adult (1 of 2 - PCV13) 04/01/2020 (Originally 03/23/2018)  . COLONOSCOPY  11/11/2023  . COVID-19 Vaccine  Completed  . INFLUENZA VACCINE  Discontinued   Immunization History  Administered Date(s) Administered  . PFIZER SARS-COV-2 Vaccination 10/15/2019, 11/05/2019   We updated and reviewed the patient's past history in detail and it is documented below. Allergies: Patient is allergic to cortisone, erythromycin base, neurontin [gabapentin], eggs or egg-derived products, prednisone, flexeril [cyclobenzaprine], and naproxen. Past Medical History Patient  has a past medical history of Cervical spondylosis, Chronic neck  pain (01/14/2020), Chronic pain, Depression, Heart murmur, History of posterior vitreous detachment (01/03/2018), Hyperlipidemia, Hypertension, Mild intermittent asthma (01/14/2020), and Tremors of nervous system. Past Surgical History Patient  has no past surgical history on file. Family History: Patient family history includes Cancer - Lung in her brother; Heart disease in her brother and father; Hypertension in her father; Lung disease in her brother and sister; Pulmonary embolism in her mother; Syncope episode in her sister. Social History:  Patient  reports that she quit smoking about 14 years ago. She has never used smokeless tobacco. She reports that she does not drink alcohol and does not use drugs.  Review of Systems: Constitutional: negative for fever or malaise Ophthalmic: negative for photophobia, double vision or loss of vision Cardiovascular: negative for chest pain, dyspnea on exertion, or new LE swelling Respiratory: negative for SOB or persistent cough Gastrointestinal: negative for abdominal pain, change in bowel habits or  melena Genitourinary: negative for dysuria or gross hematuria, no abnormal uterine bleeding or disharge Musculoskeletal: negative for new gait disturbance or muscular weakness Integumentary: negative for new or persistent rashes, no breast lumps Neurological: negative for TIA or stroke symptoms Psychiatric: negative for SI or delusions Allergic/Immunologic: negative for hives  Patient Care Team    Relationship Specialty Notifications Start End  Leamon Arnt, MD PCP - General Family Medicine  01/14/20   Marybelle Killings, MD Consulting Physician Orthopedic Surgery  07/27/15   Ashok Pall, MD Consulting Physician Neurosurgery  12/12/17   Hortencia Pilar, MD Consulting Physician Surgery  01/03/18   Kathrynn Ducking, MD Consulting Physician Neurology  04/02/19     Objective  Vitals: BP (!) 142/80   Pulse 99   Temp 98.7 F (37.1 C) (Temporal)   Resp 18   Ht 6' (1.829 m)   Wt 250 lb (113.4 kg)   SpO2 97%   BMI 33.91 kg/m  General:  Well developed, well nourished, no acute distress  Psych:  Alert and orientedx3,normal mood and affect HEENT:  Normocephalic, atraumatic, non-icteric sclera,  supple neck without adenopathy, mass or thyromegaly Cardiovascular:  Normal S1, S2, RRR without gallop, rub or murmur, no edema Respiratory:  Good breath sounds bilaterally, CTAB with normal respiratory effort Gastrointestinal: normal bowel sounds, soft, non-tender, no noted masses. No HSM MSK: OA changes in hands Skin:  Warm, no rashes or suspicious lesions noted Neurologic:    Masked face present, resting bilateral tremor .Mental status is normal.    Commons side effects, risks, benefits, and alternatives for medications and treatment plan prescribed today were discussed, and the patient expressed understanding of the given instructions. Patient is instructed to call or message via MyChart if he/she has any questions or concerns regarding our treatment plan. No barriers to understanding were  identified. We discussed Red Flag symptoms and signs in detail. Patient expressed understanding regarding what to do in case of urgent or emergency type symptoms.   Medication list was reconciled, printed and provided to the patient in AVS. Patient instructions and summary information was reviewed with the patient as documented in the AVS. This note was prepared with assistance of Dragon voice recognition software. Occasional wrong-word or sound-a-like substitutions may have occurred due to the inherent limitations of voice recognition software  This visit occurred during the SARS-CoV-2 public health emergency.  Safety protocols were in place, including screening questions prior to the visit, additional usage of staff PPE, and extensive cleaning of exam room while observing appropriate contact time as indicated for disinfecting  solutions.

## 2020-01-14 NOTE — Patient Instructions (Signed)
Please return in 3 months to recheck your blood pressure.  We will call you with your lab results.   I have refilled your amlodipine and sertraline.   If you have any questions or concerns, please don't hesitate to send me a message via MyChart or call the office at [phone]. Thank you for visiting with [place] today! It's our pleasure caring for you.  I have ordered a mammogram and/or bone density for you as we discussed today: [x]   Mammogram  [x]   Bone Density  Please call the office checked below to schedule your appointment: Your appointment will at the following location  [x]   [facility]      [place]         []   [facility]  [place], [place]  Make sure to wear two peace clothing  No lotions powders or deodorants the day of the appointment Make sure to bring picture ID and insurance card.  Bring list of medications you are currently taking including any supplements.

## 2020-01-15 LAB — VITAMIN B12: Vitamin B-12: 664 pg/mL (ref 200–1100)

## 2020-01-15 LAB — CBC WITH DIFFERENTIAL/PLATELET
Absolute Monocytes: 348 cells/uL (ref 200–950)
Basophils Absolute: 52 cells/uL (ref 0–200)
Basophils Relative: 1 %
Eosinophils Absolute: 177 cells/uL (ref 15–500)
Eosinophils Relative: 3.4 %
HCT: 43.5 % (ref 35.0–45.0)
Hemoglobin: 14.5 g/dL (ref 11.7–15.5)
Lymphs Abs: 1742 cells/uL (ref 850–3900)
MCH: 31 pg (ref 27.0–33.0)
MCHC: 33.3 g/dL (ref 32.0–36.0)
MCV: 92.9 fL (ref 80.0–100.0)
MPV: 10.9 fL (ref 7.5–12.5)
Monocytes Relative: 6.7 %
Neutro Abs: 2881 cells/uL (ref 1500–7800)
Neutrophils Relative %: 55.4 %
Platelets: 312 10*3/uL (ref 140–400)
RBC: 4.68 10*6/uL (ref 3.80–5.10)
RDW: 13.3 % (ref 11.0–15.0)
Total Lymphocyte: 33.5 %
WBC: 5.2 10*3/uL (ref 3.8–10.8)

## 2020-01-15 LAB — LIPID PANEL
Cholesterol: 226 mg/dL — ABNORMAL HIGH (ref <200)
HDL: 52 mg/dL (ref >=50)
LDL Cholesterol (Calc): 150 mg/dL (calc) — ABNORMAL HIGH
Non-HDL Cholesterol (Calc): 174 mg/dL (calc) — ABNORMAL HIGH (ref <130)
Total CHOL/HDL Ratio: 4.3 (calc) (ref <5.0)
Triglycerides: 122 mg/dL (ref <150)

## 2020-01-15 LAB — COMPREHENSIVE METABOLIC PANEL
AG Ratio: 1.3 (calc) (ref 1.0–2.5)
ALT: 8 U/L (ref 6–29)
AST: 12 U/L (ref 10–35)
Albumin: 4.4 g/dL (ref 3.6–5.1)
Alkaline phosphatase (APISO): 88 U/L (ref 37–153)
BUN: 10 mg/dL (ref 7–25)
CO2: 26 mmol/L (ref 20–32)
Calcium: 9.6 mg/dL (ref 8.6–10.4)
Chloride: 103 mmol/L (ref 98–110)
Creat: 0.74 mg/dL (ref 0.50–0.99)
Globulin: 3.5 g/dL (calc) (ref 1.9–3.7)
Glucose, Bld: 92 mg/dL (ref 65–99)
Potassium: 4.1 mmol/L (ref 3.5–5.3)
Sodium: 138 mmol/L (ref 135–146)
Total Bilirubin: 0.5 mg/dL (ref 0.2–1.2)
Total Protein: 7.9 g/dL (ref 6.1–8.1)

## 2020-01-15 LAB — TSH: TSH: 1.67 mIU/L (ref 0.40–4.50)

## 2020-01-15 LAB — VITAMIN D 25 HYDROXY (VIT D DEFICIENCY, FRACTURES): Vit D, 25-Hydroxy: 16 ng/mL — ABNORMAL LOW (ref 30–100)

## 2020-01-15 MED ORDER — VITAMIN D (ERGOCALCIFEROL) 1.25 MG (50000 UNIT) PO CAPS: 50000.0000 [IU] | ORAL_CAPSULE | ORAL | 0 refills | Status: AC

## 2020-01-15 NOTE — Addendum Note (Signed)
Addended by: Billey Chang on: 01/15/2020 02:02 PM   Modules accepted: Orders

## 2020-01-15 NOTE — Progress Notes (Signed)
Please call patient: I have reviewed his/her lab results. Lab results show elevated cholesterol: recommend starting lipitor 10mg  nightly to lower cholesterol levels and decrease risk of stroke and heart attack. Please order and we will recheck in 28mo f/u visit.  Also start vit D supplement weekly, and otc vit D 1000 units daily. Levels are very low! Other labs look fine.   The 10-year ASCVD risk score Mikey Bussing DC Brooke Bonito., et al., 2013) is: 13.7%   Values used to calculate the score:     Age: 69 years     Sex: Female     Is Non-Hispanic African American: Yes     Diabetic: No     Tobacco smoker: No     Systolic Blood Pressure: 481 mmHg     Is BP treated: Yes     HDL Cholesterol: 52 mg/dL     Total Cholesterol: 226 mg/dL

## 2020-01-16 ENCOUNTER — Other Ambulatory Visit: Payer: Self-pay | Admitting: Family Medicine

## 2020-01-16 DIAGNOSIS — Z780 Asymptomatic menopausal state: Secondary | ICD-10-CM

## 2020-01-16 DIAGNOSIS — Z13820 Encounter for screening for osteoporosis: Secondary | ICD-10-CM

## 2020-01-23 ENCOUNTER — Other Ambulatory Visit: Payer: Self-pay | Admitting: Family Medicine

## 2020-01-23 DIAGNOSIS — Z13820 Encounter for screening for osteoporosis: Secondary | ICD-10-CM

## 2020-01-23 DIAGNOSIS — Z1231 Encounter for screening mammogram for malignant neoplasm of breast: Secondary | ICD-10-CM

## 2020-01-27 ENCOUNTER — Ambulatory Visit: Payer: Medicare Other | Admitting: Neurology

## 2020-04-13 LAB — HM DIABETES EYE EXAM

## 2020-04-20 ENCOUNTER — Encounter: Payer: Self-pay | Admitting: Family Medicine

## 2020-04-21 ENCOUNTER — Ambulatory Visit (INDEPENDENT_AMBULATORY_CARE_PROVIDER_SITE_OTHER): Payer: Medicare HMO | Admitting: Family Medicine

## 2020-04-21 ENCOUNTER — Encounter: Payer: Self-pay | Admitting: Family Medicine

## 2020-04-21 ENCOUNTER — Other Ambulatory Visit: Payer: Self-pay

## 2020-04-21 VITALS — BP 164/86 | HR 91 | Temp 98.6°F | Ht 70.0 in | Wt 248.8 lb

## 2020-04-21 DIAGNOSIS — M542 Cervicalgia: Secondary | ICD-10-CM

## 2020-04-21 DIAGNOSIS — I10 Essential (primary) hypertension: Secondary | ICD-10-CM

## 2020-04-21 DIAGNOSIS — M25511 Pain in right shoulder: Secondary | ICD-10-CM

## 2020-04-21 DIAGNOSIS — R251 Tremor, unspecified: Secondary | ICD-10-CM

## 2020-04-21 DIAGNOSIS — Z2821 Immunization not carried out because of patient refusal: Secondary | ICD-10-CM | POA: Insufficient documentation

## 2020-04-21 DIAGNOSIS — G8929 Other chronic pain: Secondary | ICD-10-CM

## 2020-04-21 DIAGNOSIS — H9201 Otalgia, right ear: Secondary | ICD-10-CM

## 2020-04-21 DIAGNOSIS — M25512 Pain in left shoulder: Secondary | ICD-10-CM

## 2020-04-21 MED ORDER — METOPROLOL SUCCINATE ER 25 MG PO TB24
25.0000 mg | ORAL_TABLET | Freq: Every day | ORAL | 3 refills | Status: DC
Start: 2020-04-21 — End: 2020-07-28

## 2020-04-21 NOTE — Patient Instructions (Signed)
Please return in 3 months to recheck your blood pressure.   Please add the toprol xl 25 mg daily to your amlodipine. You may take them together.   We will call with an appointment with sports medicine to help with your shoulder pain.  Start daily allegra to help the fullness in your left ear.   If you have any questions or concerns, please don't hesitate to send me a message via MyChart or call the office at [phone]. Thank you for visiting with Brooke Coleman today! It's our pleasure caring for you.

## 2020-04-21 NOTE — Progress Notes (Signed)
Subjective  CC:  Chief Complaint  Patient presents with  . Hypertension    doesn't take her BP at home  . Ear Pain    Right ear pain off and on for a while, Patient states she feel like it's something in her ear.  . Arm Pain    bilateral arm pain, PT think its her rotator cuff     HPI: Brooke Coleman is a 69 y.o. female who presents to the office today to address the problems listed above in the chief complaint.  Hypertension f/u: Control is unknown. hasn't checked outside of office. feels fine. . Pt reports she is doing well. taking medications as instructed, no medication side effects noted, no TIAs, no chest pain on exertion, no dyspnea on exertion, no swelling of ankles. She denies adverse effects from his BP medications. Compliance with medication is good.   C/o bilateral shoulder pain for several months. 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 advil does not help. Can't lift right arm above shoulder. 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 No injuries. At times left is worse than right. Reviewed shoulder xray left showing djd of AC joint only. Denies hip or groin pain. Denies weakness.   C/o feeling fullness and pressure in right ear x months. Denies allergy sxs, ear pain, trauma or loss of hearing.   Assessment  1. Essential hypertension   2. Tremor   3. Chronic neck pain   4. Chronic pain of both shoulders   5. Right ear pain      Plan    Hypertension f/u: BP control is poorly controlled. Add toprol xl 25 daily. Will titrate up slowly. Pt is skeptical of meds due to h/o side effects. Continue amlodipine 10  B shoulder pain f/u: chronic by chart review but worse as of late. Decreased rom bilaterally: refer to [facility] for further eval and treatment. Pt defers nsaids due to SEs.   Neck pain  Ear fullness: likely allergic: start allegra. Has small effusion.   Education regarding management of these chronic disease states was given. Management strategies discussed on successive visits include  dietary and exercise recommendations, goals of achieving and maintaining IBW, and lifestyle modifications aiming for adequate sleep and minimizing stressors.   Follow up: 3 mo for bp f/u  Orders Placed This Encounter  Procedures  . Ambulatory referral to Sports Medicine   Meds ordered this encounter  Medications  . metoprolol succinate (TOPROL-XL) 25 MG 24 hr tablet    Sig: Take 1 tablet (25 mg total) by mouth daily.    Dispense:  90 tablet    Refill:  3      BP Readings from Last 3 Encounters:  04/21/20 (!) 164/86  01/14/20 (!) 142/80  04/02/19 134/78   Wt Readings from Last 3 Encounters:  04/21/20 248 lb 12.8 oz (112.9 kg)  01/14/20 250 lb (113.4 kg)  04/02/19 257 lb 0.9 oz (116.6 kg)    Lab Results  Component Value Date   CHOL 226 (H) 01/14/2020   CHOL 236 (H) 04/02/2019   CHOL 220 (H) 11/07/2016   Lab Results  Component Value Date   HDL 52 01/14/2020   HDL 44.80 04/02/2019   HDL 48.80 11/07/2016   Lab Results  Component Value Date   LDLCALC 150 (H) 01/14/2020   LDLCALC 164 (H) 04/02/2019   LDLCALC 144 (H) 11/07/2016   Lab Results  Component Value Date   TRIG 122 01/14/2020   TRIG 138.0 04/02/2019   TRIG  133.0 11/07/2016   Lab Results  Component Value Date   CHOLHDL 4.3 01/14/2020   CHOLHDL 5 04/02/2019   CHOLHDL 5 11/07/2016   No results found for: LDLDIRECT Lab Results  Component Value Date   CREATININE 0.74 01/14/2020   BUN 10 01/14/2020   NA 138 01/14/2020   K 4.1 01/14/2020   CL 103 01/14/2020   CO2 26 01/14/2020    The 10-year ASCVD risk score Mikey Bussing DC Jr., et al., 2013) is: 19.1%   Values used to calculate the score:     Age: 72 years     Sex: Female     Is Non-Hispanic African American: Yes     Diabetic: No     Tobacco smoker: No     Systolic Blood Pressure: 762 mmHg     Is BP treated: Yes     HDL Cholesterol: 52 mg/dL     Total Cholesterol: 226 mg/dL  I reviewed the patients updated PMH, FH, and SocHx.    Patient Active  Problem List   Diagnosis Date Noted  . Chronic insomnia 01/24/2017    Priority: High  . Essential hypertension 11/07/2016    Priority: High  . Dysthymia 11/07/2016    Priority: High  . Tremor 07/27/2015    Priority: High  . Cervical spondylosis without myelopathy 06/04/2015    Priority: High  . Primary osteoarthritis of both hands 01/14/2020    Priority: Medium  . Primary osteoarthritis of both wrists 01/14/2020    Priority: Medium  . Mild intermittent asthma 01/14/2020    Priority: Medium  . Gastroesophageal reflux disease without esophagitis 11/07/2016    Priority: Medium  . Bilateral carpal tunnel syndrome 06/04/2015    Priority: Medium  . Vitamin D deficiency 01/14/2020    Priority: Low  . Vitamin B12 deficiency 01/14/2020    Priority: Low  . Drusen of macula, bilateral 01/03/2018    Priority: Low  . Refused pneumococcal vaccination 04/21/2020  . Chronic neck pain 01/14/2020    Allergies: Cortisone, Erythromycin base, Neurontin [gabapentin], Eggs or egg-derived products, Prednisone, Flexeril [cyclobenzaprine], and Naproxen  Social History: Patient  reports that she quit smoking about 14 years ago. She has never used smokeless tobacco. She reports that she does not drink alcohol and does not use drugs.  Current Meds  Medication Sig  . albuterol (VENTOLIN HFA) 108 (90 Base) MCG/ACT inhaler Inhale 2 puffs into the lungs every 6 (six) hours as needed for wheezing or shortness of breath.  Marland Kitchen amLODipine (NORVASC) 10 MG tablet Take 1 tablet (10 mg total) by mouth daily.  Marland Kitchen aspirin 81 MG tablet Take 81 mg by mouth daily.  . diphenhydrAMINE (BENADRYL) 25 mg capsule Take 50 mg by mouth at bedtime. Takes 1/2 tablet at bedtime. Could not take during the day because it very tired   . EPINEPHrine 0.3 mg/0.3 mL IJ SOAJ injection Inject 0.3 mLs (0.3 mg total) into the muscle as needed for anaphylaxis.  Marland Kitchen sertraline (ZOLOFT) 100 MG tablet Take 1 tablet (100 mg total) by mouth daily.     Review of Systems: Cardiovascular: negative for chest pain, palpitations, leg swelling, orthopnea Respiratory: negative for SOB, wheezing or persistent cough Gastrointestinal: negative for abdominal pain Genitourinary: negative for dysuria or gross hematuria  Objective  Vitals: BP (!) 164/86   Pulse 91   Temp 98.6 F (37 C) (Temporal)   Ht 5\' 10"  (1.778 m)   Wt 248 lb 12.8 oz (112.9 kg)   SpO2 99%   BMI  35.70 kg/m  General: no acute distress  Psych:  Alert and oriented, normal mood and affect Cardiovascular:  RRR without murmur. no edema Respiratory:  Good breath sounds bilaterally, CTAB with normal respiratory effort Bilateral shoulder with ttp anteriorly, + empty can testing and decreased abduction, passive and active  No trap ttp. No cervical spine ttp   Commons side effects, risks, benefits, and alternatives for medications and treatment plan prescribed today were discussed, and the patient expressed understanding of the given instructions. Patient is instructed to call or message via MyChart if he/she has any questions or concerns regarding our treatment plan. No barriers to understanding were identified. We discussed Red Flag symptoms and signs in detail. Patient expressed understanding regarding what to do in case of urgent or emergency type symptoms.   Medication list was reconciled, printed and provided to the patient in AVS. Patient instructions and summary information was reviewed with the patient as documented in the AVS. This note was prepared with assistance of Dragon voice recognition software. Occasional wrong-word or sound-a-like substitutions may have occurred due to the inherent limitations of voice recognition software  This visit occurred during the SARS-CoV-2 public health emergency.  Safety protocols were in place, including screening questions prior to the visit, additional usage of staff PPE, and extensive cleaning of exam room while observing appropriate  contact time as indicated for disinfecting solutions.

## 2020-04-27 ENCOUNTER — Ambulatory Visit
Admission: RE | Admit: 2020-04-27 | Discharge: 2020-04-27 | Disposition: A | Discharge status: Home / Self Care | Payer: Medicare HMO | Source: Ambulatory Visit | Attending: Family Medicine | Admitting: Family Medicine

## 2020-04-27 ENCOUNTER — Encounter: Payer: Self-pay | Admitting: Family Medicine

## 2020-04-27 ENCOUNTER — Other Ambulatory Visit: Payer: Self-pay

## 2020-04-27 DIAGNOSIS — Z1231 Encounter for screening mammogram for malignant neoplasm of breast: Secondary | ICD-10-CM

## 2020-04-27 DIAGNOSIS — Z13820 Encounter for screening for osteoporosis: Secondary | ICD-10-CM

## 2020-05-03 ENCOUNTER — Other Ambulatory Visit: Payer: Self-pay | Admitting: Family Medicine

## 2020-05-03 ENCOUNTER — Telehealth: Payer: Self-pay | Admitting: Family Medicine

## 2020-05-03 DIAGNOSIS — R928 Other abnormal and inconclusive findings on diagnostic imaging of breast: Secondary | ICD-10-CM

## 2020-05-03 NOTE — Telephone Encounter (Signed)
Pt will call back to schedule Medicare Annual Wellness Visit (AWV) either virtually OR in office.   Last AWV 04/02/19,  please schedule at anytime with [facility]Nurse Health Advisor at [facility].  This should be a 45 minute visit.

## 2020-05-04 ENCOUNTER — Ambulatory Visit: Payer: Medicare HMO

## 2020-05-05 ENCOUNTER — Ambulatory Visit: Payer: Medicare HMO | Admitting: Family Medicine

## 2020-05-10 NOTE — Progress Notes (Signed)
I, Peterson Lombard, LAT, ATC acting as a scribe for Lynne Leader, MD.  Subjective:    I'm seeing this patient as a consultation for Dr. Billey Chang. Note will be routed back to referring provider/PCP.  CC: Chronic bilat shoulder pain  HPI: Pt is a 69 y/o female presenting w/ c/o bilat shoulder pain. Pt c/o pn in R side of c-spine and bilat shoulder pain and pain into hands. Of note pt has obvious tremor in both hands. Pn describes shoulder pain asdeep and all over shoulder region. 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 No numbness/tingling.  Pain has been going on for years worse recently.  She notes that she has been diagnosed previously with cervical spine neuroforaminal stenosis and had been advised to have surgery previously.  She has delayed surgery order to avoid surgery if possible.  She also has a diagnosis of either essential tremor or Parkinson's disease or both per neurology.  several months w/ no known MOI. Pt locates pain to  //. Pt c/o limited ROM especially pain w/ shoulder flexion and ABD. R>L  Radiating pain: Neck pain: yes Aggravates: attempting to raise arms above shoulder level; doing her hair; Rx tried:  Dx imaging: 04/27/20 Bone density screening           11/02/17 L shoulder XR  Past medical history, Surgical history, Family history, Social history, Allergies, and medications have been entered into the medical record, reviewed.   Review of Systems: No new headache, visual changes, nausea, vomiting, diarrhea, constipation, dizziness, abdominal pain, skin rash, fevers, chills, night sweats, weight loss, swollen lymph nodes, body aches, joint swelling, muscle aches, chest pain, shortness of breath, mood changes, visual or auditory hallucinations.   Objective:    Vitals:   05/11/20 1422  BP: 132/82   General: Well Developed, well nourished, and in no acute distress.  Neuro/Psych: Alert and oriented x3, extra-ocular muscles intact, able to move all 4 extremities, sensation grossly  intact. Skin: Warm and dry, no rashes noted.  Respiratory: Not using accessory muscles, speaking in full sentences, trachea midline.  Cardiovascular: Pulses palpable, no extremity edema. Abdomen: Does not appear distended. MSK: C-spine normal-appearing nontender midline.  Decreased cervical motion.  Upper extremity strength diminished shoulder abduction bilaterally otherwise intact.  Reflexes and sensation intact.  Significant tremor present bilateral upper extremities.  Right shoulder normal-appearing  nontender  normal motion pain with abduction.   Diminished strength abduction otherwise intact.   Hawkins and Neer's test minimally positive. Negative Yergason's and speeds test.  Left shoulder normal-appearing Nontender. Normal motion however pain is present with abduction.  SI demonstrate abduction otherwise intact. Hawkins and Neer's test mildly positive. Negative Yergason's and speeds test.   Lab and Radiology Results  X-ray images C-spine and shoulders bilaterally obtained today personally and independently interpreted  C-spine: Reversal of normal cervical lordosis with multilevel significant DDD and facet DJD.  No acute fractures visible.  Right shoulder: I riding humeral head with significant AC DJD.  No fractures severe glenohumeral DJD.  Calcific insertional tendinopathy pattern present.  Left shoulder: High riding humeral head.  Significant AC DJD.  No fracture or severe glenohumeral DJD.  Bony change at supraspinatus insertion site consistent with calcific tendinopathy.  Await formal radiology review  Impression and Recommendations:    Assessment and Plan: 69 y.o. female with neck pain and shoulder pain bilaterally.  Most likely due to muscle dysfunction and spasm around the cervical paraspinal and paraspinal musculature as well as shoulder and shoulder girdle.  Patient does  have degenerative changes in both the shoulders and the cervical spine that could be contributing  to pain as well.  Discussed options with patient.  She would like to continue conservative management if possible.  Plan for referral to physical therapy.  She had good benefit in the past with dry needling and would like to try to proceed with that again.  Check back in 6 to 8 weeks.  Could consider muscle relaxers etc. if needed however less likely to be beneficial.  PDMP not reviewed this encounter. Orders Placed This Encounter  Procedures  . DG Cervical Spine 2 or 3 views    Standing Status:   Future    Number of Occurrences:   1    Standing Expiration Date:   05/11/2021    Order Specific Question:   Reason for Exam (SYMPTOM  OR DIAGNOSIS REQUIRED)    Answer:   eval neck pain    Order Specific Question:   Preferred imaging location?    Answer:   [facility]  . DG Shoulder Right    Standing Status:   Future    Number of Occurrences:   1    Standing Expiration Date:   05/11/2021    Order Specific Question:   Reason for Exam (SYMPTOM  OR DIAGNOSIS REQUIRED)    Answer:   shoulder pain bl    Order Specific Question:   Preferred imaging location?    Answer:   [facility]  . DG Shoulder Left    Standing Status:   Future    Number of Occurrences:   1    Standing Expiration Date:   05/11/2021    Order Specific Question:   Reason for Exam (SYMPTOM  OR DIAGNOSIS REQUIRED)    Answer:   eval shoulder pain    Order Specific Question:   Preferred imaging location?    Answer:   [facility]  . Ambulatory referral to Physical Therapy    Referral Priority:   Routine    Referral Type:   Physical Medicine    Referral Reason:   Specialty Services Required    Requested Specialty:   Physical Therapy   No orders of the defined types were placed in this encounter.   Discussed warning signs or symptoms. Please see discharge instructions. Patient expresses understanding.   The above documentation has been reviewed and is accurate and complete Lynne Leader, M.D.

## 2020-05-11 ENCOUNTER — Encounter: Payer: Self-pay | Admitting: Family Medicine

## 2020-05-11 ENCOUNTER — Ambulatory Visit (INDEPENDENT_AMBULATORY_CARE_PROVIDER_SITE_OTHER): Payer: Medicare HMO

## 2020-05-11 ENCOUNTER — Ambulatory Visit (INDEPENDENT_AMBULATORY_CARE_PROVIDER_SITE_OTHER): Payer: Medicare HMO | Admitting: Family Medicine

## 2020-05-11 ENCOUNTER — Other Ambulatory Visit: Payer: Self-pay

## 2020-05-11 VITALS — BP 132/82 | Ht 70.0 in | Wt 246.4 lb

## 2020-05-11 DIAGNOSIS — M47812 Spondylosis without myelopathy or radiculopathy, cervical region: Secondary | ICD-10-CM

## 2020-05-11 DIAGNOSIS — M25511 Pain in right shoulder: Secondary | ICD-10-CM | POA: Diagnosis not present

## 2020-05-11 DIAGNOSIS — M25512 Pain in left shoulder: Secondary | ICD-10-CM

## 2020-05-11 NOTE — Patient Instructions (Signed)
Thank you for coming in today.  Plan for PT with Dry needling (like acupuncture).  I've referred you to Physical Therapy.  Let us know if you don't hear from them in one week.  Please get an Xray today before you leave  Recheck in about 6 weeks.

## 2020-05-12 NOTE — Progress Notes (Signed)
X-ray left shoulder shows mild arthritis

## 2020-05-12 NOTE — Progress Notes (Signed)
X-ray right shoulder shows mild arthritis.

## 2020-05-12 NOTE — Progress Notes (Signed)
X-ray cervical spine shows medium arthritis for a large portion of the spine.  No fractures or severe malalignment are present.

## 2020-05-18 ENCOUNTER — Other Ambulatory Visit: Payer: Self-pay

## 2020-05-18 ENCOUNTER — Other Ambulatory Visit: Payer: Self-pay | Admitting: Family Medicine

## 2020-05-18 ENCOUNTER — Ambulatory Visit
Admission: RE | Admit: 2020-05-18 | Discharge: 2020-05-18 | Disposition: A | Discharge status: Home / Self Care | Payer: Medicare HMO | Source: Ambulatory Visit | Attending: Family Medicine | Admitting: Family Medicine

## 2020-05-18 DIAGNOSIS — R921 Mammographic calcification found on diagnostic imaging of breast: Secondary | ICD-10-CM

## 2020-05-18 DIAGNOSIS — R928 Other abnormal and inconclusive findings on diagnostic imaging of breast: Secondary | ICD-10-CM

## 2020-06-15 ENCOUNTER — Ambulatory Visit: Payer: Medicare HMO

## 2020-06-15 ENCOUNTER — Encounter: Payer: Self-pay | Admitting: Physician Assistant

## 2020-06-15 ENCOUNTER — Other Ambulatory Visit: Payer: Self-pay

## 2020-06-15 ENCOUNTER — Telehealth (INDEPENDENT_AMBULATORY_CARE_PROVIDER_SITE_OTHER): Payer: Medicare HMO | Admitting: Physician Assistant

## 2020-06-15 VITALS — Ht 70.0 in | Wt 226.0 lb

## 2020-06-15 DIAGNOSIS — R059 Cough, unspecified: Secondary | ICD-10-CM | POA: Diagnosis not present

## 2020-06-15 DIAGNOSIS — U071 COVID-19: Secondary | ICD-10-CM | POA: Diagnosis not present

## 2020-06-15 MED ORDER — ALBUTEROL SULFATE HFA 108 (90 BASE) MCG/ACT IN AERS: 2.0000 | INHALATION_SPRAY | Freq: Four times a day (QID) | RESPIRATORY_TRACT | 1 refills | Status: AC | PRN

## 2020-06-15 NOTE — Progress Notes (Signed)
Virtual Visit via Video   I connected with Brooke Coleman on 06/15/20 at 12:00 PM EST by a video enabled telemedicine application and verified that I am speaking with the correct person using two identifiers. Location patient: Home Location provider: [facility], Office Persons participating in the virtual visit: Geovanna Simko, Jarold Motto PA-C, Corky Mull, LPN   I discussed the limitations of evaluation and management by telemedicine and the availability of in person appointments. The patient expressed understanding and agreed to proceed.  I acted as a Neurosurgeon for [employer], PA-C [employer], LPN   Subjective:   HPI:   Symptom onset: started on 12/22 and had positive test on 12/26.  Travel/contacts: Lucila Maine was sick, tested positive  Vaccination status: 2 shots  Patient endorses the following symptoms: subjective fever, sinus congestion, productive cough (expectorating light yellow sputum), chest tightness and myalgia, fatigue and weakness  Has had improvement of symptoms overall.  Patient denies the following symptoms: sinus headache, rhinorrhea, sore throat, wheezing and shortness of breath  Treatments tried: Pt was seen at Urgent care on 12/26 and was started on Augmentin for 7 days completed course.   Has asthma inhaler that she has not used.  Patient risk factors: Current COVID-19 risk of complications score: 4 Smoking status: Jaylani Mcguinn  reports that she quit smoking about 15 years ago. She has never used smokeless tobacco. If female, currently pregnant? []   Yes [x]   No  ROS: See pertinent positives and negatives per HPI.  Patient Active Problem List   Diagnosis Date Noted  . Refused pneumococcal vaccination 04/21/2020  . Primary osteoarthritis of both hands 01/14/2020  . Primary osteoarthritis of both wrists 01/14/2020  . Vitamin D deficiency 01/14/2020  . Vitamin B12 deficiency 01/14/2020  . Mild intermittent asthma 01/14/2020  .  Chronic neck pain 01/14/2020  . Drusen of macula, bilateral 01/03/2018  . Chronic insomnia 01/24/2017  . Gastroesophageal reflux disease without esophagitis 11/07/2016  . Essential hypertension 11/07/2016  . Dysthymia 11/07/2016  . Tremor 07/27/2015  . Bilateral carpal tunnel syndrome 06/04/2015  . Cervical spondylosis without myelopathy 06/04/2015    Social History   Tobacco Use  . Smoking status: Former Smoker    Quit date: 06/12/2005    Years since quitting: 15.0  . Smokeless tobacco: Never Used  Substance Use Topics  . Alcohol use: No    Current Outpatient Medications:  .  albuterol (VENTOLIN HFA) 108 (90 Base) MCG/ACT inhaler, Inhale 2 puffs into the lungs every 6 (six) hours as needed for wheezing or shortness of breath., Disp: 18 g, Rfl: 1 .  amLODipine (NORVASC) 10 MG tablet, Take 1 tablet (10 mg total) by mouth daily., Disp: 90 tablet, Rfl: 3 .  aspirin 81 MG tablet, Take 81 mg by mouth daily., Disp: , Rfl:  .  diphenhydrAMINE (BENADRYL) 25 mg capsule, Take 50 mg by mouth at bedtime. Takes 1/2 tablet at bedtime. Could not take during the day because it very tired, Disp: , Rfl:  .  EPINEPHrine 0.3 mg/0.3 mL IJ SOAJ injection, Inject 0.3 mLs (0.3 mg total) into the muscle as needed for anaphylaxis., Disp: 1 each, Rfl: 1 .  metoprolol succinate (TOPROL-XL) 25 MG 24 hr tablet, Take 1 tablet (25 mg total) by mouth daily., Disp: 90 tablet, Rfl: 3 .  sertraline (ZOLOFT) 100 MG tablet, Take 1 tablet (100 mg total) by mouth daily., Disp: 90 tablet, Rfl: 3  Allergies  Allergen Reactions  . Cortisone Anaphylaxis  . Erythromycin Base Anaphylaxis  .  Neurontin [Gabapentin] Swelling  . Eggs Or Egg-Derived Products   . Prednisone Other (See Comments)    Pinching in chest  . Flexeril [Cyclobenzaprine]     "pinching in chest"  . Naproxen     "pinching in chest"    Objective:   VITALS: Per patient if applicable, see vitals. GENERAL: Alert, appears well and in no acute  distress. HEENT: Atraumatic, conjunctiva clear, no obvious abnormalities on inspection of external nose and ears. NECK: Normal movements of the head and neck. CARDIOPULMONARY: No increased WOB. Speaking in clear sentences. I:E ratio WNL.  MS: Moves all visible extremities without noticeable abnormality. PSYCH: Pleasant and cooperative, well-groomed. Speech normal rate and rhythm. Affect is appropriate. Insight and judgement are appropriate. Attention is focused, linear, and appropriate.  NEURO: CN grossly intact. Oriented as arrived to appointment on time with no prompting. Moves both UE equally.  SKIN: No obvious lesions, wounds, erythema, or cyanosis noted on face or hands.  Assessment and Plan:   Bonne was seen today for covid positive.  Diagnoses and all orders for this visit:  COVID-19   No red flags on discussion, patient is not in any obvious distress during our visit. Discussed progression of most viral illness, and recommended supportive care at this point in time. I have refilled her albuterol inhaler for her. She is unable to take prednisone due to side effects. Discussed over the counter supportive care options, with recommendations to push fluids and rest. Reviewed return precautions including new/worsening fever, SOB, new/worsening cough or other concerns.  Recommended need to self-quarantine and practice social distancing until symptoms resolve. I recommend that patient follow-up if symptoms worsen or persist despite treatment x 7-10 days, sooner if needed.  She is planning to get a booster when eligible.  I discussed the assessment and treatment plan with the patient. The patient was provided an opportunity to ask questions and all were answered. The patient agreed with the plan and demonstrated an understanding of the instructions.   The patient was advised to call back or seek an in-person evaluation if the symptoms worsen or if the condition fails to improve as  anticipated.   CMA or LPN served as scribe during this visit. History, Physical, and Plan performed by medical provider. The above documentation has been reviewed and is accurate and complete.   [place]ante, [place] 06/15/2020

## 2020-06-23 ENCOUNTER — Ambulatory Visit: Payer: Medicare HMO | Admitting: Family Medicine

## 2020-06-29 ENCOUNTER — Ambulatory Visit: Payer: Medicare HMO | Admitting: Physical Therapy

## 2020-07-28 ENCOUNTER — Ambulatory Visit (INDEPENDENT_AMBULATORY_CARE_PROVIDER_SITE_OTHER): Payer: Medicare HMO | Admitting: Family Medicine

## 2020-07-28 ENCOUNTER — Other Ambulatory Visit: Payer: Self-pay | Admitting: Family Medicine

## 2020-07-28 ENCOUNTER — Other Ambulatory Visit: Payer: Self-pay

## 2020-07-28 ENCOUNTER — Encounter: Payer: Self-pay | Admitting: Family Medicine

## 2020-07-28 VITALS — BP 170/92 | Temp 98.1°F | Wt 240.4 lb

## 2020-07-28 DIAGNOSIS — R251 Tremor, unspecified: Secondary | ICD-10-CM

## 2020-07-28 DIAGNOSIS — I10 Essential (primary) hypertension: Secondary | ICD-10-CM | POA: Diagnosis not present

## 2020-07-28 DIAGNOSIS — M47812 Spondylosis without myelopathy or radiculopathy, cervical region: Secondary | ICD-10-CM | POA: Diagnosis not present

## 2020-07-28 DIAGNOSIS — F5104 Psychophysiologic insomnia: Secondary | ICD-10-CM | POA: Diagnosis not present

## 2020-07-28 DIAGNOSIS — F341 Dysthymic disorder: Secondary | ICD-10-CM

## 2020-07-28 DIAGNOSIS — F430 Acute stress reaction: Secondary | ICD-10-CM

## 2020-07-28 MED ORDER — METOPROLOL SUCCINATE ER 25 MG PO TB24
25.0000 mg | ORAL_TABLET | Freq: Every day | ORAL | 3 refills | Status: DC
Start: 2020-07-28 — End: 2021-06-08

## 2020-07-28 NOTE — Progress Notes (Signed)
Subjective  CC:  Chief Complaint  Patient presents with  . Hypertension    Denies starting metoprolol prescribed at last visit - only taking amlodipine 10 mg daily   . Shoulder Pain    COVID around late December, never started PT with Ander Purpura     HPI: Brooke Coleman is a 70 y.o. female who presents to the office today to address the problems listed above in the chief complaint.  Hypertension f/u: Patient returns for 21-month follow-up.  She has uncontrolled blood pressure.  She was on amlodipine 10 mg daily.  We added low-dose Toprol 25 mg daily however patient did not take it.  She reports that she ran out of the profile that could cause chest pain and she did not want to have problems with it.  She is skeptical about medications.  She denies chest pain or shortness of breath.  She admits she is very upset today.  Apparently she has had fraudulent activity in her bank account.  She is working with pain to sort through it all but her bank account Has been emptied out.  She is understandably distraught.  She feels this could be making her blood pressure worse.  She has not checked her blood pressure outside of the office:.  Dysthymia on sertraline 100 mg daily but admits to worsening depressive symptoms.  She reports she does not have an appetite.  She had Covid back in December but no appetite persists.  May be mood related.  Tremor: She has not had follow-up with neurology.  Tremors worsening.  She has sustained multiple falls.  She will not use a walker  Neck pain and shoulder pain: She was referred to sports medicine.  She has been referred to evaluated by orthopedics and neurosurgery in the past.  She does not want surgery.  She was scheduled for physical therapy but was unable to go because of Covid.  She has not made follow-up.  She is not yet ready to start this at this time.  She has not scheduled follow-up   Assessment  1. Essential hypertension   2. Tremor   3. Cervical  spondylosis without myelopathy   4. Chronic insomnia   5. Dysthymia   6. Stress reaction      Plan    Hypertension f/u: BP control is poorly controlled.  Counseling and education given.  Less risks and benefits of medication.  Discussed her fears of medications.  Patient says she will start it.  Recheck in 4 weeks.  Discuss risks of untreated blood pressure  Tremor f/u: High fall risk.  Patient declines getting a walker.  I recommend follow-up with neurology to further clarify her diagnosis that would give her better treatment options.  She is hesitant.  Insomnia and depression: Clearly upset because her recent stressors.  Will reevaluate in 4 weeks time.  Adjust medications if needed.  Patient able to this plan.  Chronic back pain: Defer physical therapy for now. Education regarding management of these chronic disease states was given. Management strategies discussed on successive visits include dietary and exercise recommendations, goals of achieving and maintaining IBW, and lifestyle modifications aiming for adequate sleep and minimizing stressors.   Follow up: 4 weeks to recheck blood pressure and mood  No orders of the defined types were placed in this encounter.  Meds ordered this encounter  Medications  . metoprolol succinate (TOPROL-XL) 25 MG 24 hr tablet    Sig: Take 1 tablet (25 mg total) by mouth daily.  Dispense:  90 tablet    Refill:  3      BP Readings from Last 3 Encounters:  07/28/20 (!) 170/92  05/11/20 132/82  04/21/20 (!) 164/86   Wt Readings from Last 3 Encounters:  07/28/20 240 lb 6.4 oz (109 kg)  06/15/20 226 lb (102.5 kg)  05/11/20 246 lb 6.4 oz (111.8 kg)    Lab Results  Component Value Date   CHOL 226 (H) 01/14/2020   CHOL 236 (H) 04/02/2019   CHOL 220 (H) 11/07/2016   Lab Results  Component Value Date   HDL 52 01/14/2020   HDL 44.80 04/02/2019   HDL 48.80 11/07/2016   Lab Results  Component Value Date   LDLCALC 150 (H) 01/14/2020    LDLCALC 164 (H) 04/02/2019   LDLCALC 144 (H) 11/07/2016   Lab Results  Component Value Date   TRIG 122 01/14/2020   TRIG 138.0 04/02/2019   TRIG 133.0 11/07/2016   Lab Results  Component Value Date   CHOLHDL 4.3 01/14/2020   CHOLHDL 5 04/02/2019   CHOLHDL 5 11/07/2016   No results found for: LDLDIRECT Lab Results  Component Value Date   CREATININE 0.74 01/14/2020   BUN 10 01/14/2020   NA 138 01/14/2020   K 4.1 01/14/2020   CL 103 01/14/2020   CO2 26 01/14/2020    The 10-year ASCVD risk score Mikey Bussing DC Jr., et al., 2013) is: 20.5%   Values used to calculate the score:     Age: 89 years     Sex: Female     Is Non-Hispanic African American: Yes     Diabetic: No     Tobacco smoker: No     Systolic Blood Pressure: 536 mmHg     Is BP treated: Yes     HDL Cholesterol: 52 mg/dL     Total Cholesterol: 226 mg/dL  I reviewed the patients updated PMH, FH, and SocHx.    Patient Active Problem List   Diagnosis Date Noted  . Chronic insomnia 01/24/2017    Priority: High  . Essential hypertension 11/07/2016    Priority: High  . Dysthymia 11/07/2016    Priority: High  . Tremor 07/27/2015    Priority: High  . Cervical spondylosis without myelopathy 06/04/2015    Priority: High  . Primary osteoarthritis of both hands 01/14/2020    Priority: Medium  . Primary osteoarthritis of both wrists 01/14/2020    Priority: Medium  . Mild intermittent asthma 01/14/2020    Priority: Medium  . Gastroesophageal reflux disease without esophagitis 11/07/2016    Priority: Medium  . Bilateral carpal tunnel syndrome 06/04/2015    Priority: Medium  . Vitamin D deficiency 01/14/2020    Priority: Low  . Vitamin B12 deficiency 01/14/2020    Priority: Low  . Drusen of macula, bilateral 01/03/2018    Priority: Low  . Refused pneumococcal vaccination 04/21/2020  . Chronic neck pain 01/14/2020    Allergies: Cortisone, Erythromycin base, Neurontin [gabapentin], Eggs or egg-derived products,  Prednisone, Flexeril [cyclobenzaprine], and Naproxen  Social History: Patient  reports that she quit smoking about 15 years ago. She has never used smokeless tobacco. She reports that she does not drink alcohol and does not use drugs.  Current Meds  Medication Sig  . albuterol (VENTOLIN HFA) 108 (90 Base) MCG/ACT inhaler Inhale 2 puffs into the lungs every 6 (six) hours as needed for wheezing or shortness of breath.  Marland Kitchen amLODipine (NORVASC) 10 MG tablet Take 1 tablet (10 mg total) by  mouth daily.  Marland Kitchen aspirin 81 MG tablet Take 81 mg by mouth daily.  . diphenhydrAMINE (BENADRYL) 25 mg capsule Take 50 mg by mouth at bedtime. Takes 1/2 tablet at bedtime. Could not take during the day because it very tired  . EPINEPHrine 0.3 mg/0.3 mL IJ SOAJ injection Inject 0.3 mLs (0.3 mg total) into the muscle as needed for anaphylaxis.  Marland Kitchen sertraline (ZOLOFT) 100 MG tablet Take 1 tablet (100 mg total) by mouth daily.    Review of Systems: Cardiovascular: negative for chest pain, palpitations, leg swelling, orthopnea Respiratory: negative for SOB, wheezing or persistent cough Gastrointestinal: negative for abdominal pain Genitourinary: negative for dysuria or gross hematuria  Objective  Vitals: BP (!) 170/92   Temp 98.1 F (36.7 C) (Temporal)   Wt 240 lb 6.4 oz (109 kg)   SpO2 92%   BMI 34.49 kg/m  General: no acute distress  Psych:  Alert and oriented, tearful HEENT:  Normocephalic, atraumatic, supple neck  Cardiovascular:  RRR without murmur. no edema Respiratory:  Good breath sounds bilaterally, CTAB with normal respiratory effort Skin:  Warm, no rashes Neurologic:   Resting tremor present  Commons side effects, risks, benefits, and alternatives for medications and treatment plan prescribed today were discussed, and the patient expressed understanding of the given instructions. Patient is instructed to call or message via MyChart if he/she has any questions or concerns regarding our treatment  plan. No barriers to understanding were identified. We discussed Red Flag symptoms and signs in detail. Patient expressed understanding regarding what to do in case of urgent or emergency type symptoms.   Medication list was reconciled, printed and provided to the patient in AVS. Patient instructions and summary information was reviewed with the patient as documented in the AVS. This note was prepared with assistance of Dragon voice recognition software. Occasional wrong-word or sound-a-like substitutions may have occurred due to the inherent limitations of voice recognition software  This visit occurred during the SARS-CoV-2 public health emergency.  Safety protocols were in place, including screening questions prior to the visit, additional usage of staff PPE, and extensive cleaning of exam room while observing appropriate contact time as indicated for disinfecting solutions.

## 2020-07-28 NOTE — Patient Instructions (Signed)
Please return in 4 weeks to recheck blood pressure, mood and weight.   Please start the Toprol XL 25mg  one tablet daily in addition to your amlodipine. We need to get your blood pressure controlled.   If you have any questions or concerns, please don't hesitate to send me a message via MyChart or call the office at [phone]. Thank you for visiting with [place] today! It's our pleasure caring for you.

## 2020-08-18 ENCOUNTER — Ambulatory Visit: Payer: Medicare HMO | Attending: Internal Medicine

## 2020-08-18 DIAGNOSIS — Z23 Encounter for immunization: Secondary | ICD-10-CM

## 2020-08-18 NOTE — Progress Notes (Signed)
   Covid-19 Vaccination Clinic  Name:  Brooke Coleman    MRN: [id number] DOB: 1951-01-30  08/18/2020  [name] was observed post Covid-19 immunization for 15 minutes without incident. She was provided with Vaccine Information Sheet and instruction to access the V-Safe system.   [name] was instructed to call 911 with any severe reactions post vaccine: Marland Kitchen Difficulty breathing  . Swelling of face and throat  . A fast heartbeat  . A bad rash all over body  . Dizziness and weakness   Immunizations Administered    Name Date Dose VIS Date Route   PFIZER Comrnaty(Gray TOP) Covid-19 Vaccine 08/18/2020  4:07 PM 0.3 mL 05/20/2020 Intramuscular   Manufacturer: [employer]   Lot: [id number]   NDC: [phone]

## 2020-08-25 ENCOUNTER — Encounter: Payer: Self-pay | Admitting: Family Medicine

## 2020-08-25 ENCOUNTER — Other Ambulatory Visit: Payer: Self-pay

## 2020-08-25 ENCOUNTER — Ambulatory Visit (INDEPENDENT_AMBULATORY_CARE_PROVIDER_SITE_OTHER): Payer: Medicare HMO | Admitting: Family Medicine

## 2020-08-25 VITALS — BP 140/82 | HR 60 | Temp 98.4°F | Ht 70.0 in | Wt 242.2 lb

## 2020-08-25 DIAGNOSIS — F341 Dysthymic disorder: Secondary | ICD-10-CM | POA: Diagnosis not present

## 2020-08-25 DIAGNOSIS — M47812 Spondylosis without myelopathy or radiculopathy, cervical region: Secondary | ICD-10-CM

## 2020-08-25 DIAGNOSIS — M542 Cervicalgia: Secondary | ICD-10-CM | POA: Diagnosis not present

## 2020-08-25 DIAGNOSIS — R251 Tremor, unspecified: Secondary | ICD-10-CM

## 2020-08-25 DIAGNOSIS — Z7185 Encounter for immunization safety counseling: Secondary | ICD-10-CM

## 2020-08-25 DIAGNOSIS — G8929 Other chronic pain: Secondary | ICD-10-CM

## 2020-08-25 DIAGNOSIS — I10 Essential (primary) hypertension: Secondary | ICD-10-CM | POA: Diagnosis not present

## 2020-08-25 MED ORDER — SHINGRIX 50 MCG/0.5ML IM SUSR: 0.5000 mL | Freq: Once | INTRAMUSCULAR | 0 refills | Status: AC

## 2020-08-25 NOTE — Progress Notes (Signed)
Subjective  CC:  Chief Complaint  Patient presents with  . Hypertension  . Mood    HPI: Brooke Coleman is a 70 y.o. female who presents to the office today to address the problems listed above in the chief complaint.  Hypertension f/u: This is short-term follow-up for uncontrolled hypertension.  Restarted low-dose beta-blocker and increase amlodipine dose 4 weeks ago.  Patient is skeptical and fearful about side effects from medications, however she was able to start medications and is tolerating them well.  She has no adverse effects.  She reports her compliance is good.  Control is Now improved. Pt reports she is doing well. taking medications as instructed, no medication side effects noted, no TIAs, no chest pain on exertion, no dyspnea on exertion, no swelling of ankles. She denies adverse effects from his BP medications. Compliance with medication is good.   Dysthymia: At last visit she was discharged due to social circumstances.  Fortunately she is doing much better today.  She continues on sertraline 100 mg daily and feels this works well for her.  She is not interested in changing doses at this time.  She denies panic attacks, anhedonia or chronic worry.  Chronic neck pain persists  Tremor: Worse with stress.  No change with beta-blocker.  Has been evaluated by neurology.  She does not care to have follow-up testing done at this time.  Health maintenance: Patient is eligible for Shingrix.  She has refused pneumococcal and influenza in the past.  Assessment  1. Essential hypertension   2. Tremor   3. Dysthymia   4. Chronic neck pain   5. Cervical spondylosis without myelopathy   6. Vaccine counseling      Plan    Hypertension f/u: BP control is fairly well controlled.  She has had a good response to both of these medications.  We will continue with same dose and recheck in 3 months.  Amlodipine 10 mg daily and Toprol-XL 25 mg daily.  Tremor f/u: Significant and affects  quality of life.  Possible Parkinson's.  Patient defers follow-up with neurology as recommended.  Chronic neck pain due to cervical spondylosis: Continue to treat medically.  Patient defers surgery.  Vaccine counseling: Recommend Shingrix.  Prescription given.  Patient will consider getting it on her own. Education regarding management of these chronic disease states was given. Management strategies discussed on successive visits include dietary and exercise recommendations, goals of achieving and maintaining IBW, and lifestyle modifications aiming for adequate sleep and minimizing stressors.   Follow up: 3 months for blood pressure recheck  No orders of the defined types were placed in this encounter.  Meds ordered this encounter  Medications  . Zoster Vaccine Adjuvanted [facility]) injection    Sig: Inject 0.5 mLs into the muscle once for 1 dose. Please give 2nd dose 2-6 months after first dose    Dispense:  2 each    Refill:  0      BP Readings from Last 3 Encounters:  08/25/20 140/82  07/28/20 (!) 170/92  05/11/20 132/82   Wt Readings from Last 3 Encounters:  08/25/20 242 lb 3.2 oz (109.9 kg)  07/28/20 240 lb 6.4 oz (109 kg)  06/15/20 226 lb (102.5 kg)    Lab Results  Component Value Date   CHOL 226 (H) 01/14/2020   CHOL 236 (H) 04/02/2019   CHOL 220 (H) 11/07/2016   Lab Results  Component Value Date   HDL 52 01/14/2020   HDL 44.80 04/02/2019  HDL 48.80 11/07/2016   Lab Results  Component Value Date   LDLCALC 150 (H) 01/14/2020   LDLCALC 164 (H) 04/02/2019   LDLCALC 144 (H) 11/07/2016   Lab Results  Component Value Date   TRIG 122 01/14/2020   TRIG 138.0 04/02/2019   TRIG 133.0 11/07/2016   Lab Results  Component Value Date   CHOLHDL 4.3 01/14/2020   CHOLHDL 5 04/02/2019   CHOLHDL 5 11/07/2016   No results found for: LDLDIRECT Lab Results  Component Value Date   CREATININE 0.74 01/14/2020   BUN 10 01/14/2020   NA 138 01/14/2020   K 4.1 01/14/2020    CL 103 01/14/2020   CO2 26 01/14/2020    The 10-year ASCVD risk score Mikey Bussing DC Jr., et al., 2013) is: 13.8%   Values used to calculate the score:     Age: 28 years     Sex: Female     Is Non-Hispanic African American: Yes     Diabetic: No     Tobacco smoker: No     Systolic Blood Pressure: 570 mmHg     Is BP treated: Yes     HDL Cholesterol: 52 mg/dL     Total Cholesterol: 226 mg/dL  I reviewed the patients updated PMH, FH, and SocHx.    Patient Active Problem List   Diagnosis Date Noted  . Chronic neck pain 01/14/2020    Priority: High  . Chronic insomnia 01/24/2017    Priority: High  . Essential hypertension 11/07/2016    Priority: High  . Dysthymia 11/07/2016    Priority: High  . Tremor 07/27/2015    Priority: High  . Cervical spondylosis without myelopathy 06/04/2015    Priority: High  . Primary osteoarthritis of both hands 01/14/2020    Priority: Medium  . Primary osteoarthritis of both wrists 01/14/2020    Priority: Medium  . Mild intermittent asthma 01/14/2020    Priority: Medium  . Gastroesophageal reflux disease without esophagitis 11/07/2016    Priority: Medium  . Bilateral carpal tunnel syndrome 06/04/2015    Priority: Medium  . Vitamin D deficiency 01/14/2020    Priority: Low  . Vitamin B12 deficiency 01/14/2020    Priority: Low  . Drusen of macula, bilateral 01/03/2018    Priority: Low  . Refused pneumococcal vaccination 04/21/2020    Allergies: Cortisone, Erythromycin base, Neurontin [gabapentin], Eggs or egg-derived products, Prednisone, Flexeril [cyclobenzaprine], and Naproxen  Social History: Patient  reports that she quit smoking about 15 years ago. She has never used smokeless tobacco. She reports that she does not drink alcohol and does not use drugs.  Current Meds  Medication Sig  . albuterol (VENTOLIN HFA) 108 (90 Base) MCG/ACT inhaler Inhale 2 puffs into the lungs every 6 (six) hours as needed for wheezing or shortness of breath.  Marland Kitchen  amLODipine (NORVASC) 10 MG tablet Take 1 tablet (10 mg total) by mouth daily.  Marland Kitchen aspirin 81 MG tablet Take 81 mg by mouth daily.  . diphenhydrAMINE (BENADRYL) 25 mg capsule Take 50 mg by mouth at bedtime. Takes 1/2 tablet at bedtime. Could not take during the day because it very tired  . EPINEPHrine 0.3 mg/0.3 mL IJ SOAJ injection Inject 0.3 mLs (0.3 mg total) into the muscle as needed for anaphylaxis.  . metoprolol succinate (TOPROL-XL) 25 MG 24 hr tablet Take 1 tablet (25 mg total) by mouth daily.  . sertraline (ZOLOFT) 100 MG tablet Take 1 tablet (100 mg total) by mouth daily.  Marland Kitchen Zoster Vaccine  Adjuvanted [facility]) injection Inject 0.5 mLs into the muscle once for 1 dose. Please give 2nd dose 2-6 months after first dose    Review of Systems: Cardiovascular: negative for chest pain, palpitations, leg swelling, orthopnea Respiratory: negative for SOB, wheezing or persistent cough Gastrointestinal: negative for abdominal pain Genitourinary: negative for dysuria or gross hematuria  Objective  Vitals: BP 140/82   Pulse 60   Temp 98.4 F (36.9 C) (Temporal)   Ht 5\' 10"  (1.778 m)   Wt 242 lb 3.2 oz (109.9 kg)   SpO2 95%   BMI 34.75 kg/m  General: no acute distress  Psych:  Alert and oriented, improved mood and affect HEENT:  Normocephalic, atraumatic, supple neck  Cardiovascular:  RRR without murmur. no edema Respiratory:  Good breath sounds bilaterally, CTAB with normal respiratory effort   Commons side effects, risks, benefits, and alternatives for medications and treatment plan prescribed today were discussed, and the patient expressed understanding of the given instructions. Patient is instructed to call or message via MyChart if he/she has any questions or concerns regarding our treatment plan. No barriers to understanding were identified. We discussed Red Flag symptoms and signs in detail. Patient expressed understanding regarding what to do in case of urgent or emergency type  symptoms.   Medication list was reconciled, printed and provided to the patient in AVS. Patient instructions and summary information was reviewed with the patient as documented in the AVS. This note was prepared with assistance of Dragon voice recognition software. Occasional wrong-word or sound-a-like substitutions may have occurred due to the inherent limitations of voice recognition software  This visit occurred during the SARS-CoV-2 public health emergency.  Safety protocols were in place, including screening questions prior to the visit, additional usage of staff PPE, and extensive cleaning of exam room while observing appropriate contact time as indicated for disinfecting solutions.

## 2020-08-25 NOTE — Patient Instructions (Signed)
Please return in 3 months for blood pressure recheck.   Please take the prescription for Shingrix to the pharmacy so they may administer the vaccinations. Your insurance will then cover the injections.    If you have any questions or concerns, please don't hesitate to send me a message via MyChart or call the office at [phone]. Thank you for visiting with [place] today! It's our pleasure caring for you.

## 2020-12-01 ENCOUNTER — Other Ambulatory Visit: Payer: Self-pay

## 2020-12-01 ENCOUNTER — Ambulatory Visit (INDEPENDENT_AMBULATORY_CARE_PROVIDER_SITE_OTHER): Payer: Medicare HMO | Admitting: Family Medicine

## 2020-12-01 ENCOUNTER — Encounter: Payer: Self-pay | Admitting: Family Medicine

## 2020-12-01 VITALS — BP 123/83 | HR 76 | Temp 98.2°F | Ht 70.0 in | Wt 240.4 lb

## 2020-12-01 DIAGNOSIS — F5104 Psychophysiologic insomnia: Secondary | ICD-10-CM

## 2020-12-01 DIAGNOSIS — R251 Tremor, unspecified: Secondary | ICD-10-CM

## 2020-12-01 DIAGNOSIS — J309 Allergic rhinitis, unspecified: Secondary | ICD-10-CM

## 2020-12-01 DIAGNOSIS — F341 Dysthymic disorder: Secondary | ICD-10-CM

## 2020-12-01 DIAGNOSIS — I10 Essential (primary) hypertension: Secondary | ICD-10-CM

## 2020-12-01 MED ORDER — AMLODIPINE BESYLATE 10 MG PO TABS: 10.0000 mg | ORAL_TABLET | Freq: Every day | ORAL | 3 refills | Status: DC

## 2020-12-01 MED ORDER — SERTRALINE HCL 100 MG PO TABS: 100.0000 mg | ORAL_TABLET | Freq: Every day | ORAL | 3 refills | Status: DC

## 2020-12-01 MED ORDER — TRAZODONE HCL 50 MG PO TABS: 25.0000 mg | ORAL_TABLET | Freq: Every evening | ORAL | 5 refills | Status: DC | PRN

## 2020-12-01 MED ORDER — LEVOCETIRIZINE DIHYDROCHLORIDE 5 MG PO TABS: 5.0000 mg | ORAL_TABLET | Freq: Every evening | ORAL | 3 refills | Status: AC

## 2020-12-01 NOTE — Patient Instructions (Signed)
Please return in 6 months for your annual complete physical; please come fasting.   Start the medications for allergies and sleep as we discussed today. I have ordered them for you. Continue your blood pressure medications.   If you have any questions or concerns, please don't hesitate to send me a message via MyChart or call the office at [phone]. Thank you for visiting with [place] today! It's our pleasure caring for you.

## 2020-12-01 NOTE — Progress Notes (Signed)
Subjective  CC:  Chief Complaint  Patient presents with   Hypertension    HPI: Brooke Coleman is a 70 y.o. female who presents to the office today to address the problems listed above in the chief complaint. Hypertension f/u: Control is good . Pt reports she is doing well. taking medications as instructed, side effects noted by patient include swelling in ankles, no TIAs, no chest pain on exertion, no dyspnea on exertion.  She has done well with her compliance.  No longer fearful of these 2 medications.  Overall tolerating well although she did have a day or 2 of swollen ankles.  This resolved with elevation of her legs.  She denies adverse effects from his BP medications. Compliance with medication is good.  Dysthymia: She continues on Zoloft 100 daily.  She feels this continues to work well. Fatigue: Feeling more tired lately.  Reports she is not sleeping well.  Has history of insomnia that was treated with trazodone.  This worked well for her. Allergies are currently active: Taking Benadryl at night.  This was started for her tremor.  Not sure if it is helpful.  Has questions about allergy medications.  No symptoms of infection. Tremor: Possible Parkinson's disease.  Not taking medications for this at this point.  On low-dose beta-blocker without significant change.  Has seen neurology in the past.  No further investigation wanted by patient now.  Assessment  1. Essential hypertension   2. Dysthymia   3. Tremor   4. Chronic allergic rhinitis   5. Chronic insomnia      Plan   Hypertension f/u: BP control is well controlled.  Continue metoprolol XL 25 daily and amlodipine 10 mg daily.  Patient to monitor ankle swelling.  If becomes a problem, can decrease to 5 mg daily and adjust up beta-blocker. Tremor f/u: Recommend follow-up with neurology.  We will continue to reinforce.  Monitor symptoms for now. Allergies: Start Xyzal 5 mg daily. Fatigue: Likely due to insomnia.  Start  trazodone.  This is worked well for her in the past.  Follow-up if persist. Dysthymia: Zoloft 100 mg daily.  No changes today.  Education regarding management of these chronic disease states was given. Management strategies discussed on successive visits include dietary and exercise recommendations, goals of achieving and maintaining IBW, and lifestyle modifications aiming for adequate sleep and minimizing stressors.   Follow up: 6 months for complete physical  No orders of the defined types were placed in this encounter.  Meds ordered this encounter  Medications   amLODipine (NORVASC) 10 MG tablet    Sig: Take 1 tablet (10 mg total) by mouth daily.    Dispense:  90 tablet    Refill:  3    Please keep on file for next refill due in August   sertraline (ZOLOFT) 100 MG tablet    Sig: Take 1 tablet (100 mg total) by mouth daily.    Dispense:  90 tablet    Refill:  3    Please keep on file for next refill due August   levocetirizine (XYZAL ALLERGY 24HR) 5 MG tablet    Sig: Take 1 tablet (5 mg total) by mouth every evening.    Dispense:  90 tablet    Refill:  3   traZODone (DESYREL) 50 MG tablet    Sig: Take 0.5-1 tablets (25-50 mg total) by mouth at bedtime as needed for sleep.    Dispense:  30 tablet    Refill:  5  BP Readings from Last 3 Encounters:  12/01/20 123/83  08/25/20 140/82  07/28/20 (!) 170/92   Wt Readings from Last 3 Encounters:  12/01/20 240 lb 6.4 oz (109 kg)  08/25/20 242 lb 3.2 oz (109.9 kg)  07/28/20 240 lb 6.4 oz (109 kg)    Lab Results  Component Value Date   CHOL 226 (H) 01/14/2020   CHOL 236 (H) 04/02/2019   CHOL 220 (H) 11/07/2016   Lab Results  Component Value Date   HDL 52 01/14/2020   HDL 44.80 04/02/2019   HDL 48.80 11/07/2016   Lab Results  Component Value Date   LDLCALC 150 (H) 01/14/2020   LDLCALC 164 (H) 04/02/2019   LDLCALC 144 (H) 11/07/2016   Lab Results  Component Value Date   TRIG 122 01/14/2020   TRIG 138.0  04/02/2019   TRIG 133.0 11/07/2016   Lab Results  Component Value Date   CHOLHDL 4.3 01/14/2020   CHOLHDL 5 04/02/2019   CHOLHDL 5 11/07/2016   No results found for: LDLDIRECT Lab Results  Component Value Date   CREATININE 0.74 01/14/2020   BUN 10 01/14/2020   NA 138 01/14/2020   K 4.1 01/14/2020   CL 103 01/14/2020   CO2 26 01/14/2020    The 10-year ASCVD risk score Mikey Bussing DC Jr., et al., 2013) is: 10.5%   Values used to calculate the score:     Age: 19 years     Sex: Female     Is Non-Hispanic African American: Yes     Diabetic: No     Tobacco smoker: No     Systolic Blood Pressure: 492 mmHg     Is BP treated: Yes     HDL Cholesterol: 52 mg/dL     Total Cholesterol: 226 mg/dL  I reviewed the patients updated PMH, FH, and SocHx.    Patient Active Problem List   Diagnosis Date Noted   Chronic neck pain 01/14/2020    Priority: High   Chronic insomnia 01/24/2017    Priority: High   Essential hypertension 11/07/2016    Priority: High   Dysthymia 11/07/2016    Priority: High   Tremor 07/27/2015    Priority: High   Cervical spondylosis without myelopathy 06/04/2015    Priority: High   Primary osteoarthritis of both hands 01/14/2020    Priority: Medium   Primary osteoarthritis of both wrists 01/14/2020    Priority: Medium   Mild intermittent asthma 01/14/2020    Priority: Medium   Gastroesophageal reflux disease without esophagitis 11/07/2016    Priority: Medium   Bilateral carpal tunnel syndrome 06/04/2015    Priority: Medium   Vitamin D deficiency 01/14/2020    Priority: Low   Vitamin B12 deficiency 01/14/2020    Priority: Low   Drusen of macula, bilateral 01/03/2018    Priority: Low   Refused pneumococcal vaccination 04/21/2020    Allergies: Cortisone, Erythromycin base, Neurontin [gabapentin], Eggs or egg-derived products, Prednisone, Flexeril [cyclobenzaprine], and Naproxen  Social History: Patient  reports that she quit smoking about 15 years ago.  Her smoking use included cigarettes. She has never used smokeless tobacco. She reports that she does not drink alcohol and does not use drugs.  Current Meds  Medication Sig   albuterol (VENTOLIN HFA) 108 (90 Base) MCG/ACT inhaler Inhale 2 puffs into the lungs every 6 (six) hours as needed for wheezing or shortness of breath.   aspirin 81 MG tablet Take 81 mg by mouth daily.   diphenhydrAMINE (BENADRYL) 25 mg  capsule Take 50 mg by mouth at bedtime. Takes 1/2 tablet at bedtime. Could not take during the day because it very tired   EPINEPHrine 0.3 mg/0.3 mL IJ SOAJ injection Inject 0.3 mLs (0.3 mg total) into the muscle as needed for anaphylaxis.   levocetirizine (XYZAL ALLERGY 24HR) 5 MG tablet Take 1 tablet (5 mg total) by mouth every evening.   metoprolol succinate (TOPROL-XL) 25 MG 24 hr tablet Take 1 tablet (25 mg total) by mouth daily.   traZODone (DESYREL) 50 MG tablet Take 0.5-1 tablets (25-50 mg total) by mouth at bedtime as needed for sleep.   [DISCONTINUED] amLODipine (NORVASC) 10 MG tablet Take 1 tablet (10 mg total) by mouth daily.   [DISCONTINUED] sertraline (ZOLOFT) 100 MG tablet Take 1 tablet (100 mg total) by mouth daily.    Review of Systems: Cardiovascular: negative for chest pain, palpitations, leg swelling, orthopnea Respiratory: negative for SOB, wheezing or persistent cough Gastrointestinal: negative for abdominal pain Genitourinary: negative for dysuria or gross hematuria  Objective  Vitals: BP 123/83   Pulse 76   Temp 98.2 F (36.8 C) (Temporal)   Ht 5\' 10"  (1.778 m)   Wt 240 lb 6.4 oz (109 kg)   SpO2 99%   BMI 34.49 kg/m  General: no acute distress  Psych:  Alert and oriented, flat mood and affect HEENT:  Normocephalic, atraumatic, supple neck  Cardiovascular:  RRR without murmur. no edema Respiratory:  Good breath sounds bilaterally, CTAB with normal respiratory effort Skin:  Warm, no rashes Neurologic:   Mental status is normal, resting tremor present  bilateral upper extremities Commons side effects, risks, benefits, and alternatives for medications and treatment plan prescribed today were discussed, and the patient expressed understanding of the given instructions. Patient is instructed to call or message via MyChart if he/she has any questions or concerns regarding our treatment plan. No barriers to understanding were identified. We discussed Red Flag symptoms and signs in detail. Patient expressed understanding regarding what to do in case of urgent or emergency type symptoms.  Medication list was reconciled, printed and provided to the patient in AVS. Patient instructions and summary information was reviewed with the patient as documented in the AVS. This note was prepared with assistance of Dragon voice recognition software. Occasional wrong-word or sound-a-like substitutions may have occurred due to the inherent limitations of voice recognition software  This visit occurred during the SARS-CoV-2 public health emergency.  Safety protocols were in place, including screening questions prior to the visit, additional usage of staff PPE, and extensive cleaning of exam room while observing appropriate contact time as indicated for disinfecting solutions.

## 2021-04-14 ENCOUNTER — Telehealth: Payer: Self-pay | Admitting: Family Medicine

## 2021-04-14 NOTE — Telephone Encounter (Signed)
Copied from [place] [id number]. Topic: Medicare AWV >> Apr 14, 2021 10:27 AM Harris-Coley, Hannah Beat wrote: Reason for CRM: Called patient to sched AWV ; she requested me to call her back khc Called patient to schedule Annual Wellness Visit.  Please schedule with Nurse Health Advisor Charlott Rakes, RN at [facility].  Please call [phone] ask for [facility]

## 2021-06-01 ENCOUNTER — Encounter: Payer: Medicare HMO | Admitting: Family Medicine

## 2021-06-08 ENCOUNTER — Ambulatory Visit (INDEPENDENT_AMBULATORY_CARE_PROVIDER_SITE_OTHER): Payer: Medicare HMO | Admitting: Family Medicine

## 2021-06-08 ENCOUNTER — Encounter: Payer: Self-pay | Admitting: Family Medicine

## 2021-06-08 ENCOUNTER — Other Ambulatory Visit: Payer: Self-pay

## 2021-06-08 VITALS — BP 144/90 | HR 71 | Temp 98.3°F | Ht 70.0 in | Wt 242.8 lb

## 2021-06-08 DIAGNOSIS — Z87892 Personal history of anaphylaxis: Secondary | ICD-10-CM

## 2021-06-08 DIAGNOSIS — F5104 Psychophysiologic insomnia: Secondary | ICD-10-CM | POA: Diagnosis not present

## 2021-06-08 DIAGNOSIS — Z1231 Encounter for screening mammogram for malignant neoplasm of breast: Secondary | ICD-10-CM

## 2021-06-08 DIAGNOSIS — Z23 Encounter for immunization: Secondary | ICD-10-CM

## 2021-06-08 DIAGNOSIS — E538 Deficiency of other specified B group vitamins: Secondary | ICD-10-CM

## 2021-06-08 DIAGNOSIS — F341 Dysthymic disorder: Secondary | ICD-10-CM | POA: Diagnosis not present

## 2021-06-08 DIAGNOSIS — R251 Tremor, unspecified: Secondary | ICD-10-CM | POA: Diagnosis not present

## 2021-06-08 DIAGNOSIS — G8929 Other chronic pain: Secondary | ICD-10-CM

## 2021-06-08 DIAGNOSIS — I10 Essential (primary) hypertension: Secondary | ICD-10-CM | POA: Diagnosis not present

## 2021-06-08 DIAGNOSIS — Z0000 Encounter for general adult medical examination without abnormal findings: Secondary | ICD-10-CM

## 2021-06-08 DIAGNOSIS — M542 Cervicalgia: Secondary | ICD-10-CM

## 2021-06-08 DIAGNOSIS — R296 Repeated falls: Secondary | ICD-10-CM

## 2021-06-08 DIAGNOSIS — E559 Vitamin D deficiency, unspecified: Secondary | ICD-10-CM

## 2021-06-08 MED ORDER — EPINEPHRINE 0.3 MG/0.3ML IJ SOAJ
0.3000 mg | INTRAMUSCULAR | 1 refills | Status: AC | PRN
Start: 2021-06-08 — End: ?

## 2021-06-08 MED ORDER — TRAZODONE HCL 50 MG PO TABS: 50.0000 mg | ORAL_TABLET | Freq: Every day | ORAL | 3 refills | Status: DC

## 2021-06-08 MED ORDER — METOPROLOL SUCCINATE ER 25 MG PO TB24: 25.0000 mg | ORAL_TABLET | Freq: Every day | ORAL | 3 refills | Status: DC

## 2021-06-08 NOTE — Patient Instructions (Addendum)
Please return in 6 months for hypertension follow up. Please check your blood pressures at home to ensure they are < 140/90. I believe todays elevation is due to your pain.   I have referred you back to neurology to evaluate your tremor again. We will call you to get you scheduled.  We will order you a new walker; please use it to avoid falls.   I will release your lab results to you on your MyChart account with further instructions. Please reply with any questions.    Today you received your prevnar 20 vaccine. This helps protect against a serious pneumonia.  If you have any questions or concerns, please don't hesitate to send me a message via MyChart or call the office at [phone]. Thank you for visiting with [place] today! It's our pleasure caring for you.

## 2021-06-08 NOTE — Progress Notes (Signed)
Subjective  Chief Complaint  Patient presents with   Annual Exam    Fasting   Hypertension   Gastroesophageal Reflux    HPI: Brooke Coleman is a 70 y.o. female who presents to [place] [facility] [place] today for a Female Wellness Visit. She also has the concerns and/or needs as listed above in the chief complaint. These will be addressed in addition to the Health Maintenance Visit.   Wellness Visit: annual visit with health maintenance review and exam without Pap  HM: due mammogram; requesting switching to  regional. Had h/o abnl in 12/21 w/ benign appearing f/u in may 2022. CRC screen is current.  Eligible for prevnar 20.  Chronic disease f/u and/or acute problem visit: (deemed necessary to be done in addition to the wellness visit):  Overall she is declining. Worsening tremor and balance. Ambulates with cane. Had a fall a few weeks ago. Now moved in with daughter. Difficulty eating due to tremor.  Chronic neck and back pain persists. Needs handicapped placard renewal.  HTN f/u: Feeling well. Taking medications w/o adverse effects. No symptoms of CHF, angina; no palpitations, sob, cp or lower extremity edema. Compliant with meds. Elevated bp today but in moderate pain from neck and back. Hasn't checked home readings but had been well controlled and she reports she is compliant with her medications: amlodipine and metoprolol.  GERD is stable on PPI Dysthymia: reports her mood is stable    Assessment  1. Annual physical exam   2. Chronic insomnia   3. Dysthymia   4. Essential hypertension   5. Tremor   6. Vitamin B12 deficiency   7. Vitamin D deficiency   8. Encounter for screening mammogram for breast cancer   9. Chronic neck pain   10. History of anaphylaxis   11. Frequent falls   12. Immunization due      Plan  Female Wellness Visit: Age appropriate Health Maintenance and Prevention measures were discussed with patient. Included topics are cancer  screening recommendations, ways to keep healthy (see AVS) including dietary and exercise recommendations, regular eye and dental care, use of seat belts, and avoidance of moderate alcohol use and tobacco use. mammogram BMI: discussed patient's BMI and encouraged positive lifestyle modifications to help get to or maintain a target BMI. HM needs and immunizations were addressed and ordered. See below for orders. See HM and immunization section for updates. Prevnar 20 today Routine labs and screening tests ordered including cmp, cbc and lipids where appropriate. Discussed recommendations regarding Vit D and calcium supplementation (see AVS)  Chronic disease management visit and/or acute problem visit: Tremor and unsteady gait: walker ordered. Refer back to neuro for further eval and treatment options. ? Parkinsons. Pt had been hesitant for further eval but now things are worsening. Counseling done.  Chronic neck pain: not ready for intervention or pain meds.  HTN: restart home checks. Continue amlodipine and bb. Check renal function and electrolytes. Recheck b12 and D: h/o deficiencies, chronic PPI and not on oral supplements now.  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 Contine ssri  Follow up: 3 mo for recheck bp  Orders Placed This Encounter  Procedures   For home use only DME 4 wheeled rolling walker with seat ([id number])   MM DIGITAL SCREENING BILATERAL   Pneumococcal conjugate vaccine 20-valent (Prevnar 20)   CBC with Differential/Platelet   Comprehensive metabolic panel   X93 and Folate Panel   Lipid panel   Hemoglobin A1c  TSH   VITAMIN D 25 Hydroxy (Vit-D Deficiency, Fractures)   Hepatitis C antibody   Ambulatory referral to Neurology   Meds ordered this encounter  Medications   traZODone (DESYREL) 50 MG tablet    Sig: Take 1 tablet (50 mg total) by mouth at bedtime.    Dispense:  90 tablet    Refill:  3   metoprolol succinate (TOPROL-XL) 25 MG 24 hr tablet    Sig: Take 1  tablet (25 mg total) by mouth daily. Please keep on file for next refill due in February.    Dispense:  90 tablet    Refill:  3   EPINEPHrine 0.3 mg/0.3 mL IJ SOAJ injection    Sig: Inject 0.3 mg into the muscle as needed for anaphylaxis.    Dispense:  1 each    Refill:  1      Body mass index is 34.84 kg/m. Wt Readings from Last 3 Encounters:  06/08/21 242 lb 12.8 oz (110.1 kg)  12/01/20 240 lb 6.4 oz (109 kg)  08/25/20 242 lb 3.2 oz (109.9 kg)     Patient Active Problem List   Diagnosis Date Noted   Chronic neck pain 01/14/2020    Priority: High    Evaluated by ortho and NS: offered surgery but not clearly going to be helpful. Pt defers. Dr. Arneta Cliche    Chronic insomnia 01/24/2017    Priority: High   Essential hypertension 11/07/2016    Priority: High   Dysthymia 11/07/2016    Priority: High   Tremor 07/27/2015    Priority: High    Essential vs parkinsons's: eval 2021 neuro Referred for reevaluation; worsening, 05/2021, [facility]    Cervical spondylosis without myelopathy 06/04/2015    Priority: High   Primary osteoarthritis of both hands 01/14/2020    Priority: Medium    Primary osteoarthritis of both wrists 01/14/2020    Priority: Medium    Mild intermittent asthma 01/14/2020    Priority: Medium    Gastroesophageal reflux disease without esophagitis 11/07/2016    Priority: Medium    Bilateral carpal tunnel syndrome 06/04/2015    Priority: Medium    Vitamin D deficiency 01/14/2020    Priority: Low   Vitamin B12 deficiency 01/14/2020    Priority: Low   Drusen of macula, bilateral 01/03/2018    Priority: Low   History of anaphylaxis 06/08/2021    Medications: has epi pen    Health Maintenance  Topic Date Due   Hepatitis C Screening  Never done   COVID-19 Vaccine (4 - Booster for [place] series) 10/13/2020   MAMMOGRAM  10/10/2021   COLONOSCOPY (Pts 45-46yrs Insurance coverage will need to be confirmed)  11/11/2023   DEXA SCAN  04/27/2025   Pneumonia Vaccine 3+  Years old  Completed   HPV VACCINES  Aged Out   INFLUENZA VACCINE  Discontinued   TETANUS/TDAP  Discontinued   Zoster Vaccines- Shingrix  Discontinued   Immunization History  Administered Date(s) Administered   PFIZER Comirnaty(Gray Top)Covid-19 Tri-Sucrose Vaccine 08/18/2020   PFIZER(Purple Top)SARS-COV-2 Vaccination 10/15/2019, 11/05/2019   PNEUMOCOCCAL CONJUGATE-20 06/08/2021   We updated and reviewed the patient's past history in detail and it is documented below. Allergies: Patient is allergic to cortisone, erythromycin base, neurontin [gabapentin], eggs or egg-derived products, prednisone, flexeril [cyclobenzaprine], and naproxen. Past Medical History Patient  has a past medical history of Cervical spondylosis, Chronic neck pain (01/14/2020), Chronic pain, Depression, Heart murmur, History of posterior vitreous detachment (01/03/2018), Hyperlipidemia, Hypertension, Mild intermittent asthma (01/14/2020), and  Tremors of nervous system. Past Surgical History Patient  has no past surgical history on file. Family History: Patient family history includes Cancer - Lung in her brother; Heart disease in her brother and father; Hypertension in her father; Lung disease in her brother and sister; Pulmonary embolism in her mother; Syncope episode in her sister. Social History:  Patient  reports that she quit smoking about 16 years ago. Her smoking use included cigarettes. She has never used smokeless tobacco. She reports that she does not drink alcohol and does not use drugs.  Review of Systems: Constitutional: negative for fever or malaise Ophthalmic: negative for photophobia, double vision or loss of vision Cardiovascular: negative for chest pain, dyspnea on exertion, or new LE swelling Respiratory: negative for SOB or persistent cough Gastrointestinal: negative for abdominal pain, change in bowel habits or melena Genitourinary: negative for dysuria or gross hematuria, no abnormal uterine  bleeding or disharge Musculoskeletal: negative for new gait disturbance or muscular weakness Integumentary: negative for new or persistent rashes, no breast lumps Neurological: negative for TIA or stroke symptoms Psychiatric: negative for SI or delusions Allergic/Immunologic: negative for hives  Patient Care Team    Relationship Specialty Notifications Start End  Leamon Arnt, MD PCP - General Family Medicine  01/14/20   Marybelle Killings, MD Consulting Physician Orthopedic Surgery  07/27/15   Ashok Pall, MD Consulting Physician Neurosurgery  12/12/17   Hortencia Pilar, MD Consulting Physician Surgery  01/03/18   Kathrynn Ducking, MD Consulting Physician Neurology  04/02/19     Objective  Vitals: BP (!) 144/90    Pulse 71    Temp 98.3 F (36.8 C) (Temporal)    Ht 5\' 10"  (1.778 m)    Wt 242 lb 12.8 oz (110.1 kg)    SpO2 98%    BMI 34.84 kg/m  General:  Well developed, well nourished, no acute distress  Psych:  Alert and orientedx3,normal mood and affect HEENT:  Normocephalic, atraumatic, non-icteric sclera,  supple neck without adenopathy, mass or thyromegaly Cardiovascular:  Normal S1, S2, RRR without gallop, rub or murmur Respiratory:  Good breath sounds bilaterally, CTAB with normal respiratory effort Gastrointestinal: normal bowel sounds, soft, non-tender, no noted masses. No HSM MSK: no deformities, contusions. Joints are without erythema or swelling.  Skin:  Warm, no rashes or suspicious lesions noted Neurologic:    Mental status is normal.moderate resting tremor bilateral hands, gait is unsteady but not shuffling   Commons side effects, risks, benefits, and alternatives for medications and treatment plan prescribed today were discussed, and the patient expressed understanding of the given instructions. Patient is instructed to call or message via MyChart if he/she has any questions or concerns regarding our treatment plan. No barriers to understanding were identified. We  discussed Red Flag symptoms and signs in detail. Patient expressed understanding regarding what to do in case of urgent or emergency type symptoms.  Medication list was reconciled, printed and provided to the patient in AVS. Patient instructions and summary information was reviewed with the patient as documented in the AVS. This note was prepared with assistance of Dragon voice recognition software. Occasional wrong-word or sound-a-like substitutions may have occurred due to the inherent limitations of voice recognition software  This visit occurred during the SARS-CoV-2 public health emergency.  Safety protocols were in place, including screening questions prior to the visit, additional usage of staff PPE, and extensive cleaning of exam room while observing appropriate contact time as indicated for disinfecting solutions.

## 2021-06-14 ENCOUNTER — Other Ambulatory Visit (INDEPENDENT_AMBULATORY_CARE_PROVIDER_SITE_OTHER): Payer: Medicare HMO

## 2021-06-14 DIAGNOSIS — Z0000 Encounter for general adult medical examination without abnormal findings: Secondary | ICD-10-CM | POA: Diagnosis not present

## 2021-06-14 DIAGNOSIS — E559 Vitamin D deficiency, unspecified: Secondary | ICD-10-CM

## 2021-06-14 DIAGNOSIS — I10 Essential (primary) hypertension: Secondary | ICD-10-CM | POA: Diagnosis not present

## 2021-06-14 DIAGNOSIS — R251 Tremor, unspecified: Secondary | ICD-10-CM | POA: Diagnosis not present

## 2021-06-14 DIAGNOSIS — E538 Deficiency of other specified B group vitamins: Secondary | ICD-10-CM

## 2021-06-14 LAB — TSH: TSH: 3.83 u[IU]/mL (ref 0.35–5.50)

## 2021-06-14 LAB — COMPREHENSIVE METABOLIC PANEL
ALT: 8 U/L (ref 0–35)
AST: 13 U/L (ref 0–37)
Albumin: 4.1 g/dL (ref 3.5–5.2)
Alkaline Phosphatase: 79 U/L (ref 39–117)
BUN: 13 mg/dL (ref 6–23)
CO2: 28 mEq/L (ref 19–32)
Calcium: 9.4 mg/dL (ref 8.4–10.5)
Chloride: 104 mEq/L (ref 96–112)
Creatinine, Ser: 0.73 mg/dL (ref 0.40–1.20)
GFR: 84.62 mL/min (ref >60.00)
Glucose, Bld: 87 mg/dL (ref 70–99)
Potassium: 4.2 mEq/L (ref 3.5–5.1)
Sodium: 140 mEq/L (ref 135–145)
Total Bilirubin: 0.5 mg/dL (ref 0.2–1.2)
Total Protein: 7.3 g/dL (ref 6.0–8.3)

## 2021-06-14 LAB — LIPID PANEL
Cholesterol: 206 mg/dL — ABNORMAL HIGH (ref 0–200)
HDL: 46.8 mg/dL (ref >39.00)
LDL Cholesterol: 131 mg/dL — ABNORMAL HIGH (ref 0–99)
NonHDL: 159.15
Total CHOL/HDL Ratio: 4
Triglycerides: 139 mg/dL (ref 0.0–149.0)
VLDL: 27.8 mg/dL (ref 0.0–40.0)

## 2021-06-14 LAB — HEMOGLOBIN A1C: Hgb A1c MFr Bld: 5.5 % (ref 4.6–6.5)

## 2021-06-14 LAB — CBC WITH DIFFERENTIAL/PLATELET
Basophils Absolute: 0.1 10*3/uL (ref 0.0–0.1)
Basophils Relative: 1.1 % (ref 0.0–3.0)
Eosinophils Absolute: 0.5 10*3/uL (ref 0.0–0.7)
Eosinophils Relative: 9.3 % — ABNORMAL HIGH (ref 0.0–5.0)
HCT: 41.9 % (ref 36.0–46.0)
Hemoglobin: 14 g/dL (ref 12.0–15.0)
Lymphocytes Relative: 35.1 % (ref 12.0–46.0)
Lymphs Abs: 1.8 10*3/uL (ref 0.7–4.0)
MCHC: 33.5 g/dL (ref 30.0–36.0)
MCV: 93.8 fl (ref 78.0–100.0)
Monocytes Absolute: 0.4 10*3/uL (ref 0.1–1.0)
Monocytes Relative: 7.5 % (ref 3.0–12.0)
Neutro Abs: 2.4 10*3/uL (ref 1.4–7.7)
Neutrophils Relative %: 47 % (ref 43.0–77.0)
Platelets: 251 10*3/uL (ref 150.0–400.0)
RBC: 4.46 Mil/uL (ref 3.87–5.11)
RDW: 13.6 % (ref 11.5–15.5)
WBC: 5.1 10*3/uL (ref 4.0–10.5)

## 2021-06-14 LAB — VITAMIN D 25 HYDROXY (VIT D DEFICIENCY, FRACTURES): VITD: 13.93 ng/mL — ABNORMAL LOW (ref 30.00–100.00)

## 2021-06-14 LAB — B12 AND FOLATE PANEL
Folate: 7.1 ng/mL (ref >5.9)
Vitamin B-12: 289 pg/mL (ref 211–911)

## 2021-06-14 LAB — HEPATITIS C ANTIBODY
Hepatitis C Ab: NONREACTIVE
SIGNAL TO CUT-OFF: 0.04 (ref <1.00)

## 2021-06-16 ENCOUNTER — Other Ambulatory Visit: Payer: Self-pay

## 2021-06-20 ENCOUNTER — Telehealth: Payer: Self-pay | Admitting: Family Medicine

## 2021-06-20 NOTE — Telephone Encounter (Signed)
Called patient, unable to reach or LVM.

## 2021-06-20 NOTE — Telephone Encounter (Signed)
Does patient just get Vit D OTC? Dosage?

## 2021-06-20 NOTE — Telephone Encounter (Signed)
Spoke with patient, gave a verbal understanding °

## 2021-06-20 NOTE — Telephone Encounter (Signed)
Pt stated she received a call regarding a new prescription for Vitamin D. But it was not called in yet.

## 2021-06-24 ENCOUNTER — Telehealth: Payer: Self-pay | Admitting: Family Medicine

## 2021-06-24 NOTE — Telephone Encounter (Signed)
Copied from [place] [id number]. Topic: Medicare AWV >> Jun 24, 2021 10:45 AM Harris-Coley, Hannah Beat wrote: Reason for CRM: Left message for patient to schedule Annual Wellness Visit.  Please schedule with Nurse Health Advisor Charlott Rakes, RN at [facility].  Please call [phone] ask for [facility]

## 2021-06-24 NOTE — Telephone Encounter (Signed)
Pt called regarding a missed call. I informed her it was to schedule her AWV but she declined.

## 2021-07-12 ENCOUNTER — Other Ambulatory Visit: Payer: Medicare HMO

## 2021-07-29 ENCOUNTER — Ambulatory Visit (INDEPENDENT_AMBULATORY_CARE_PROVIDER_SITE_OTHER): Payer: Medicare HMO

## 2021-07-29 ENCOUNTER — Other Ambulatory Visit: Payer: Self-pay

## 2021-07-29 DIAGNOSIS — Z0000 Encounter for general adult medical examination without abnormal findings: Secondary | ICD-10-CM | POA: Diagnosis not present

## 2021-07-29 NOTE — Progress Notes (Signed)
Virtual Visit via Telephone Note  I connected with  Princella Pellegrini on 07/29/21 at  1:45 PM EST by telephone and verified that I am speaking with the correct person using two identifiers.  Medicare Annual Wellness visit completed telephonically due to Covid-19 pandemic.   Persons participating in this call: This Health Coach and this patient.   Location: Patient: Home Provider: Office    I discussed the limitations, risks, security and privacy concerns of performing an evaluation and management service by telephone and the availability of in person appointments. The patient expressed understanding and agreed to proceed.  Unable to perform video visit due to video visit attempted and failed and/or patient does not have video capability.   Some vital signs may be absent or patient reported.   Willette Brace, LPN   Subjective:   Lynea Rollison is a 71 y.o. female who presents for Medicare Annual (Subsequent) preventive examination.  Review of Systems     Cardiac Risk Factors include: advanced age (>57men, >31 women);hypertension;obesity (BMI >30kg/m2)     Objective:    Today's Vitals   07/29/21 1347  PainSc: 8    There is no height or weight on file to calculate BMI.  Advanced Directives 07/29/2021 04/02/2019 07/27/2015  Does Patient Have a Medical Advance Directive? No No No  Would patient like information on creating a medical advance directive? Yes (MAU/Ambulatory/Procedural Areas - Information given) Yes (MAU/Ambulatory/Procedural Areas - Information given) -    Current Medications (verified) Outpatient Encounter Medications as of 07/29/2021  Medication Sig   albuterol (VENTOLIN HFA) 108 (90 Base) MCG/ACT inhaler Inhale 2 puffs into the lungs every 6 (six) hours as needed for wheezing or shortness of breath.   amLODipine (NORVASC) 10 MG tablet Take 1 tablet (10 mg total) by mouth daily.   aspirin 81 MG tablet Take 81 mg by mouth daily.   EPINEPHrine 0.3 mg/0.3 mL IJ  SOAJ injection Inject 0.3 mg into the muscle as needed for anaphylaxis.   levocetirizine (XYZAL ALLERGY 24HR) 5 MG tablet Take 1 tablet (5 mg total) by mouth every evening.   metoprolol succinate (TOPROL-XL) 25 MG 24 hr tablet Take 1 tablet (25 mg total) by mouth daily. Please keep on file for next refill due in February.   sertraline (ZOLOFT) 100 MG tablet Take 1 tablet (100 mg total) by mouth daily.   traZODone (DESYREL) 50 MG tablet Take 1 tablet (50 mg total) by mouth at bedtime.   No facility-administered encounter medications on file as of 07/29/2021.    Allergies (verified) Cortisone, Erythromycin base, Neurontin [gabapentin], Eggs or egg-derived products, Prednisone, Flexeril [cyclobenzaprine], and Naproxen   History: Past Medical History:  Diagnosis Date   Cervical spondylosis    Chronic neck pain 01/14/2020   Evaluated by ortho and NS: offered surgery but not clearly going to be helpful. Pt defers.    Chronic pain    Depression    Heart murmur    History of posterior vitreous detachment 01/03/2018   Hyperlipidemia    Hypertension    Mild intermittent asthma 01/14/2020   Tremors of nervous system    History reviewed. No pertinent surgical history. Family History  Problem Relation Age of Onset   Pulmonary embolism Mother    Hypertension Father    Heart disease Father    Lung disease Sister    Syncope episode Sister    Lung disease Brother    Heart disease Brother    Cancer - Lung Brother    Social  History   Socioeconomic History   Marital status: Divorced    Spouse name: Not on file   Number of children: 2   Years of education: AAS   Highest education level: Not on file  Occupational History   Occupation: retired  Tobacco Use   Smoking status: Former    Types: Cigarettes    Quit date: 06/12/2005    Years since quitting: 16.1   Smokeless tobacco: Never  Substance and Sexual Activity   Alcohol use: No   Drug use: No   Sexual activity: Not on file  Other Topics  Concern   Not on file  Social History Narrative   Lives at home w/ her daughter   Patient drinks 1 cup of coffee daily.   Patient is left handed.    Social Determinants of Health   Financial Resource Strain: Low Risk    Difficulty of Paying Living Expenses: Not hard at all  Food Insecurity: No Food Insecurity   Worried About Charity fundraiser in the Last Year: Never true   [place] in the Last Year: Never true  Transportation Needs: No Transportation Needs   Lack of Transportation (Medical): No   Lack of Transportation (Non-Medical): No  Physical Activity: Inactive   Days of Exercise per Week: 0 days   Minutes of Exercise per Session: 0 min  Stress: No Stress Concern Present   Feeling of Stress : Not at all  Social Connections: Socially Isolated   Frequency of Communication with Friends and Family: Three times a week   Frequency of Social Gatherings with Friends and Family: Three times a week   Attends Religious Services: Never   Active Member of Clubs or Organizations: No   Attends Music therapist: Never   Marital Status: Divorced    Tobacco Counseling Counseling given: Not Answered   Clinical Intake:  Pre-visit preparation completed: Yes  Pain : 0-10 Pain Score: 8  Pain Type: Chronic pain Pain Location: Back (and shoulder) Pain Orientation: Left, Lower Pain Descriptors / Indicators: Aching Pain Onset: More than a month ago Pain Frequency: Intermittent     BMI - recorded: 34.84 Nutritional Status: BMI > 30  Obese Nutritional Risks: None Diabetes: No  How often do you need to have someone help you when you read instructions, pamphlets, or other written materials from your doctor or pharmacy?: 1 - Never  Diabetic?No  Interpreter Needed?: No  Information entered by :: Charlott Rakes, LPN   Activities of Daily Living In your present state of health, do you have any difficulty performing the following activities: 07/29/2021 08/25/2020   Hearing? N N  Vision? N N  Difficulty concentrating or making decisions? N N  Walking or climbing stairs? N Y  Dressing or bathing? N Y  Doing errands, shopping? N Y  Comment - does not go alone  Conservation officer, nature and eating ? Y -  Comment sometimes can't finish because back hurts -  Using the Toilet? N -  In the past six months, have you accidently leaked urine? N -  Do you have problems with loss of bowel control? N -  Managing your Medications? N -  Managing your Finances? N -  Housekeeping or managing your Housekeeping? N -  Some recent data might be hidden    Patient Care Team: Leamon Arnt, MD as PCP - General (Family Medicine) Marybelle Killings, MD as Consulting Physician (Orthopedic Surgery) Ashok Pall, MD as Consulting Physician (Neurosurgery) Kathlen Mody,  Annita Brod, MD as Consulting Physician (Surgery) Kathrynn Ducking, MD (Inactive) as Consulting Physician (Neurology)  Indicate any recent Medical Services you may have received from other than [facility] providers in the past year (date may be approximate).     Assessment:   This is a routine wellness examination for [facility]  Hearing/Vision screen Hearing Screening - Comments:: Pt denies any hearing issues  Vision Screening - Comments:: Pt follows up with Dr Herbert Deaner for annul eye exams   Dietary issues and exercise activities discussed: Current Exercise Habits: The patient does not participate in regular exercise at present   Goals Addressed             This Visit's Progress    Patient Stated       None at this time        Depression Screen PHQ 2/9 Scores 07/29/2021 06/08/2021 08/25/2020 01/14/2020 04/02/2019 05/08/2017 11/07/2016  PHQ - 2 Score 0 0 3 6 2 5 1   PHQ- 9 Score - 8 6 6 12 14 6     Fall Risk Fall Risk  07/29/2021 07/28/2020 01/14/2020 07/27/2017  Falls in the past year? 1 1 0 No  Number falls in past yr: 1 1 0 -  Injury with Fall? 1 1 0 -  Comment back injury - - -  Risk for fall due to : Impaired  vision Impaired balance/gait - -  Follow up Falls prevention discussed Education provided - -    FALL RISK PREVENTION PERTAINING TO THE HOME:  Any stairs in or around the home? Yes  If so, are there any without handrails? No  Home free of loose throw rugs in walkways, pet beds, electrical cords, etc? Yes  Adequate lighting in your home to reduce risk of falls? Yes   ASSISTIVE DEVICES UTILIZED TO PREVENT FALLS:  Life alert? No  Use of a cane, walker or w/c? Yes  Grab bars in the bathroom? Yes  Shower chair or bench in shower? Yes  Elevated toilet seat or a handicapped toilet? No   TIMED UP AND GO:  Was the test performed? No .    Cognitive Function:     6CIT Screen 07/29/2021 04/02/2019  What Year? 0 points 0 points  What month? 0 points 0 points  What time? 0 points 0 points  Count back from 20 0 points 0 points  Months in reverse 0 points 0 points  Repeat phrase 4 points 2 points  Total Score 4 2    Immunizations Immunization History  Administered Date(s) Administered   PFIZER Comirnaty(Gray Top)Covid-19 Tri-Sucrose Vaccine 08/18/2020   PFIZER(Purple Top)SARS-COV-2 Vaccination 10/15/2019, 11/05/2019   PNEUMOCOCCAL CONJUGATE-20 06/08/2021      Flu Vaccine status: Declined, Education has been provided regarding the importance of this vaccine but patient still declined. Advised may receive this vaccine at local pharmacy or Health Dept. Aware to provide a copy of the vaccination record if obtained from local pharmacy or Health Dept. Verbalized acceptance and understanding.  Pneumococcal vaccine status: Up to date  Covid-19 vaccine status: Completed vaccines  Qualifies for Shingles Vaccine? Yes   Zostavax completed No   Shingrix Completed?: No.    Education has been provided regarding the importance of this vaccine. Patient has been advised to call insurance company to determine out of pocket expense if they have not yet received this vaccine. Advised may also  receive vaccine at local pharmacy or Health Dept. Verbalized acceptance and understanding.  Screening Tests Health Maintenance  Topic Date Due  COVID-19 Vaccine (4 - Booster for Pfizer series) 10/13/2020   MAMMOGRAM  10/10/2021   COLONOSCOPY (Pts 45-65yrs Insurance coverage will need to be confirmed)  11/11/2023   DEXA SCAN  04/27/2025   Pneumonia Vaccine 48+ Years old  Completed   Hepatitis C Screening  Completed   HPV VACCINES  Aged Out   INFLUENZA VACCINE  Discontinued   TETANUS/TDAP  Discontinued   Zoster Vaccines- Shingrix  Discontinued    Health Maintenance  Health Maintenance Due  Topic Date Due   COVID-19 Vaccine (4 - Booster for [place] series) 10/13/2020    Colorectal cancer screening: Type of screening: Colonoscopy. Completed 11/10/13. Repeat every 10 years  Mammogram status: Completed 10/10/20. Repeat every year  Bone density    Additional Screening:  Hepatitis C Screening:  Completed 06/14/21   Vision Screening: Recommended annual ophthalmology exams for early detection of glaucoma and other disorders of the eye. Is the patient up to date with their annual eye exam?  Yes  Who is the provider or what is the name of the office in which the patient attends annual eye exams? Dr Herbert Deaner  If pt is not established with a provider, would they like to be referred to a provider to establish care? No .   Dental Screening: Recommended annual dental exams for proper oral hygiene  Community Resource Referral / Chronic Care Management: CRR required this visit?  No   CCM required this visit?  No      Plan:     I have personally reviewed and noted the following in the patients chart:   Medical and social history Use of alcohol, tobacco or illicit drugs  Current medications and supplements including opioid prescriptions.  Functional ability and status Nutritional status Physical activity Advanced directives List of other physicians Hospitalizations, surgeries, and  ER visits in previous 12 months Vitals Screenings to include cognitive, depression, and falls Referrals and appointments  In addition, I have reviewed and discussed with patient certain preventive protocols, quality metrics, and best practice recommendations. A written personalized care plan for preventive services as well as general preventive health recommendations were provided to patient.     Willette Brace, LPN   1/61/0960   Nurse Notes: None

## 2021-07-29 NOTE — Patient Instructions (Signed)
Brooke Coleman , Thank you for taking time to come for your Medicare Wellness Visit. I appreciate your ongoing commitment to your health goals. Please review the following plan we discussed and let me know if I can assist you in the future.   Screening recommendations/referrals: Colonoscopy: Done 11/10/13 repeat every 10 years  Mammogram: Done 10/10/20 repeat every year  Bone Density: Done 04/27/20 repeat every 2 years  Recommended yearly ophthalmology/optometry visit for glaucoma screening and checkup Recommended yearly dental visit for hygiene and checkup  Vaccinations: Influenza vaccine: Declined  Pneumococcal vaccine: Done 06/08/21 Tdap vaccine: Declined  Shingles vaccine: Shingrix discussed. Please contact your pharmacy for coverage information.    Covid-19:Completed 5/5, 11/05/19, & 08/18/20  Advanced directives: Advance directive discussed with you today. I have provided a copy for you to complete at home and have notarized. Once this is complete please bring a copy in to our office so we can scan it into your chart.  Conditions/risks identified: None at this time   Next appointment: Follow up in one year for your annual wellness visit    Preventive Care 65 Years and Older, Female Preventive care refers to lifestyle choices and visits with your health care provider that can promote health and wellness. What does preventive care include? A yearly physical exam. This is also called an annual well check. Dental exams once or twice a year. Routine eye exams. Ask your health care provider how often you should have your eyes checked. Personal lifestyle choices, including: Daily care of your teeth and gums. Regular physical activity. Eating a healthy diet. Avoiding tobacco and drug use. Limiting alcohol use. Practicing safe sex. Taking low-dose aspirin every day. Taking vitamin and mineral supplements as recommended by your health care provider. What happens during an annual well  check? The services and screenings done by your health care provider during your annual well check will depend on your age, overall health, lifestyle risk factors, and family history of disease. Counseling  Your health care provider may ask you questions about your: Alcohol use. Tobacco use. Drug use. Emotional well-being. Home and relationship well-being. Sexual activity. Eating habits. History of falls. Memory and ability to understand (cognition). Work and work Statistician. Reproductive health. Screening  You may have the following tests or measurements: Height, weight, and BMI. Blood pressure. Lipid and cholesterol levels. These may be checked every 5 years, or more frequently if you are over 62 years old. Skin check. Lung cancer screening. You may have this screening every year starting at age 53 if you have a 30-pack-year history of smoking and currently smoke or have quit within the past 15 years. Fecal occult blood test (FOBT) of the stool. You may have this test every year starting at age 90. Flexible sigmoidoscopy or colonoscopy. You may have a sigmoidoscopy every 5 years or a colonoscopy every 10 years starting at age 28. Hepatitis C blood test. Hepatitis B blood test. Sexually transmitted disease (STD) testing. Diabetes screening. This is done by checking your blood sugar (glucose) after you have not eaten for a while (fasting). You may have this done every 1-3 years. Bone density scan. This is done to screen for osteoporosis. You may have this done starting at age 62. Mammogram. This may be done every 1-2 years. Talk to your health care provider about how often you should have regular mammograms. Talk with your health care provider about your test results, treatment options, and if necessary, the need for more tests. Vaccines  Your health  care provider may recommend certain vaccines, such as: Influenza vaccine. This is recommended every year. Tetanus, diphtheria, and  acellular pertussis (Tdap, Td) vaccine. You may need a Td booster every 10 years. Zoster vaccine. You may need this after age 81. Pneumococcal 13-valent conjugate (PCV13) vaccine. One dose is recommended after age 82. Pneumococcal polysaccharide (PPSV23) vaccine. One dose is recommended after age 48. Talk to your health care provider about which screenings and vaccines you need and how often you need them. This information is not intended to replace advice given to you by your health care provider. Make sure you discuss any questions you have with your health care provider. Document Released: 06/25/2015 Document Revised: 02/16/2016 Document Reviewed: 03/30/2015 Elsevier Interactive Patient Education  2017 [place] Prevention in the Home Falls can cause injuries. They can happen to people of all ages. There are many things you can do to make your home safe and to help prevent falls. What can I do on the outside of my home? Regularly fix the edges of walkways and driveways and fix any cracks. Remove anything that might make you trip as you walk through a door, such as a raised step or threshold. Trim any bushes or trees on the path to your home. Use bright outdoor lighting. Clear any walking paths of anything that might make someone trip, such as rocks or tools. Regularly check to see if handrails are loose or broken. Make sure that both sides of any steps have handrails. Any raised decks and porches should have guardrails on the edges. Have any leaves, snow, or ice cleared regularly. Use sand or salt on walking paths during winter. Clean up any spills in your garage right away. This includes oil or grease spills. What can I do in the bathroom? Use night lights. Install grab bars by the toilet and in the tub and shower. Do not use towel bars as grab bars. Use non-skid mats or decals in the tub or shower. If you need to sit down in the shower, use a plastic, non-slip stool. Keep  the floor dry. Clean up any water that spills on the floor as soon as it happens. Remove soap buildup in the tub or shower regularly. Attach bath mats securely with double-sided non-slip rug tape. Do not have throw rugs and other things on the floor that can make you trip. What can I do in the bedroom? Use night lights. Make sure that you have a light by your bed that is easy to reach. Do not use any sheets or blankets that are too big for your bed. They should not hang down onto the floor. Have a firm chair that has side arms. You can use this for support while you get dressed. Do not have throw rugs and other things on the floor that can make you trip. What can I do in the kitchen? Clean up any spills right away. Avoid walking on wet floors. Keep items that you use a lot in easy-to-reach places. If you need to reach something above you, use a strong step stool that has a grab bar. Keep electrical cords out of the way. Do not use floor polish or wax that makes floors slippery. If you must use wax, use non-skid floor wax. Do not have throw rugs and other things on the floor that can make you trip. What can I do with my stairs? Do not leave any items on the stairs. Make sure that there are handrails on  both sides of the stairs and use them. Fix handrails that are broken or loose. Make sure that handrails are as long as the stairways. Check any carpeting to make sure that it is firmly attached to the stairs. Fix any carpet that is loose or worn. Avoid having throw rugs at the top or bottom of the stairs. If you do have throw rugs, attach them to the floor with carpet tape. Make sure that you have a light switch at the top of the stairs and the bottom of the stairs. If you do not have them, ask someone to add them for you. What else can I do to help prevent falls? Wear shoes that: Do not have high heels. Have rubber bottoms. Are comfortable and fit you well. Are closed at the toe. Do not  wear sandals. If you use a stepladder: Make sure that it is fully opened. Do not climb a closed stepladder. Make sure that both sides of the stepladder are locked into place. Ask someone to hold it for you, if possible. Clearly mark and make sure that you can see: Any grab bars or handrails. First and last steps. Where the edge of each step is. Use tools that help you move around (mobility aids) if they are needed. These include: Canes. Walkers. Scooters. Crutches. Turn on the lights when you go into a dark area. Replace any light bulbs as soon as they burn out. Set up your furniture so you have a clear path. Avoid moving your furniture around. If any of your floors are uneven, fix them. If there are any pets around you, be aware of where they are. Review your medicines with your doctor. Some medicines can make you feel dizzy. This can increase your chance of falling. Ask your doctor what other things that you can do to help prevent falls. This information is not intended to replace advice given to you by your health care provider. Make sure you discuss any questions you have with your health care provider. Document Released: 03/25/2009 Document Revised: 11/04/2015 Document Reviewed: 07/03/2014 Elsevier Interactive Patient Education  2017 [employer].

## 2021-08-11 ENCOUNTER — Other Ambulatory Visit: Payer: Self-pay | Admitting: Family Medicine

## 2021-11-13 ENCOUNTER — Other Ambulatory Visit: Payer: Self-pay | Admitting: Family Medicine

## 2021-12-07 ENCOUNTER — Ambulatory Visit (INDEPENDENT_AMBULATORY_CARE_PROVIDER_SITE_OTHER): Payer: Medicare HMO | Admitting: Family Medicine

## 2021-12-07 ENCOUNTER — Encounter: Payer: Self-pay | Admitting: Family Medicine

## 2021-12-07 VITALS — BP 150/80 | HR 74 | Temp 98.2°F | Ht 70.0 in | Wt 243.8 lb

## 2021-12-07 DIAGNOSIS — R251 Tremor, unspecified: Secondary | ICD-10-CM | POA: Diagnosis not present

## 2021-12-07 DIAGNOSIS — M47812 Spondylosis without myelopathy or radiculopathy, cervical region: Secondary | ICD-10-CM

## 2021-12-07 DIAGNOSIS — G8929 Other chronic pain: Secondary | ICD-10-CM

## 2021-12-07 DIAGNOSIS — F341 Dysthymic disorder: Secondary | ICD-10-CM

## 2021-12-07 DIAGNOSIS — I10 Essential (primary) hypertension: Secondary | ICD-10-CM | POA: Diagnosis not present

## 2021-12-07 DIAGNOSIS — M542 Cervicalgia: Secondary | ICD-10-CM

## 2021-12-07 MED ORDER — SERTRALINE HCL 100 MG PO TABS: 100.0000 mg | ORAL_TABLET | Freq: Every day | ORAL | 3 refills | Status: DC

## 2021-12-07 MED ORDER — AMITRIPTYLINE HCL 25 MG PO TABS: 25.0000 mg | ORAL_TABLET | Freq: Every day | ORAL | 3 refills | Status: DC

## 2021-12-07 MED ORDER — AMLODIPINE BESYLATE 10 MG PO TABS: 10.0000 mg | ORAL_TABLET | Freq: Every day | ORAL | 3 refills | Status: DC

## 2021-12-07 MED ORDER — METOPROLOL SUCCINATE ER 50 MG PO TB24: 50.0000 mg | ORAL_TABLET | Freq: Every day | ORAL | 3 refills | Status: DC

## 2021-12-07 NOTE — Patient Instructions (Signed)
Please return in 3 months to recheck blood pressure and neck pain   Call [facility] to get an appointment to evaluate your worsening tremor again. The referral was placed back in December.  [phone]   We are trying to get you a mammogram scheduled with [facility].  You may need to sign a release of records from the Breast center so they can send them the images.  It looks like your last mammogram was in 2021. If you have any questions or concerns, please don't hesitate to send me a message via MyChart or call the office at [phone]. Thank you for visiting with [place] today! It's our pleasure caring for you.

## 2021-12-08 NOTE — Progress Notes (Signed)
Subjective  CC:  Chief Complaint  Patient presents with   Hypertension    Pt here for a 69mo F/U with Bp. Pt would like to have another referral plced to [facility]  [facility] told here that they seen something on her Rt Breast and told her to come back I n 676mo    HPI: [name] a [age].o. female who presents to the office today to address the problems listed above in the chief complaint. Hypertension f/u: Control is  unknown . Pt reports she is doing well. taking medications as instructed, no medication side effects noted, no TIAs, no chest pain on exertion, no dyspnea on exertion, no swelling of ankles. Pt reports she is in pain today and thinks that is causing her bp to be elevated today. We had asked her to restart her medication in December and she is just now getting back for f/u. She denies adverse effects from his BP medications. Compliance with medication is good.  Tremor/possible parkinsons. [facility] did not call for an appt. She states she is ready to do this now. Tremor and gait are worsening.  Neck pain: worsening however pt does not want further intervention. Has been NS in the past who recommended surgery. She inquires about accupuncture. Takes tylenol. Doesn't want pain meds. Has failed gabapentin in past.  Dysthymia: reports is stable on zoloft.   Assessment  1. Essential hypertension   2. Dysthymia   3. Tremor   4. Cervical spondylosis without myelopathy   5. Chronic neck pain      Plan   Hypertension f/u: BP control is fairly well controlled. Will increase metoprolol xl to '50mg'$  and continue amlodipine 10. Recheck in 3 months. Continue zoloft for mood control Neck pain: trial of elavil 25 nighlty. Pt can let me know if she wants more help.  Tremor: [facility].  Ordered mammogram. OD  Education regarding management of these chronic disease states was given. Management strategies discussed on  successive visits include dietary and exercise recommendations, goals of achieving and maintaining IBW, and lifestyle modifications aiming for adequate sleep and minimizing stressors.   Follow up: 3 mo for recheck bp  No orders of the defined types were placed in this encounter.  Meds ordered this encounter  Medications   metoprolol succinate (TOPROL-XL) 50 MG 24 hr tablet    Sig: Take 1 tablet (50 mg total) by mouth daily.    Dispense:  90 tablet    Refill:  3    Increasing dose from '25mg'$  daily. Thanks.   amLODipine (NORVASC) 10 MG tablet    Sig: Take 1 tablet (10 mg total) by mouth daily.    Dispense:  90 tablet    Refill:  3   sertraline (ZOLOFT) 100 MG tablet    Sig: Take 1 tablet (100 mg total) by mouth daily.    Dispense:  90 tablet    Refill:  3   amitriptyline (ELAVIL) 25 MG tablet    Sig: Take 1 tablet (25 mg total) by mouth at bedtime.    Dispense:  90 tablet    Refill:  3      BP Readings from Last 3 Encounters:  12/07/21 (!) 150/80  06/08/21 (!) 144/90  12/01/20 123/83   Wt Readings from Last 3 Encounters:  12/07/21 243 lb 12.8 oz (110.6 kg)  06/08/21 242 lb 12.8 oz (110.1 kg)  12/01/20 240 lb 6.4 oz (  109 kg)    Lab Results  Component Value Date   CHOL 206 (H) 06/14/2021   CHOL 226 (H) 01/14/2020   CHOL 236 (H) 04/02/2019   Lab Results  Component Value Date   HDL 46.80 06/14/2021   HDL 52 01/14/2020   HDL 44.80 04/02/2019   Lab Results  Component Value Date   LDLCALC 131 (H) 06/14/2021   LDLCALC 150 (H) 01/14/2020   LDLCALC 164 (H) 04/02/2019   Lab Results  Component Value Date   TRIG 139.0 06/14/2021   TRIG 122 01/14/2020   TRIG 138.0 04/02/2019   Lab Results  Component Value Date   CHOLHDL 4 06/14/2021   CHOLHDL 4.3 01/14/2020   CHOLHDL 5 04/02/2019   No results found for: "LDLDIRECT" Lab Results  Component Value Date   CREATININE 0.73 06/14/2021   BUN 13 06/14/2021   NA 140 06/14/2021   K 4.2 06/14/2021   CL 104 06/14/2021    CO2 28 06/14/2021    The 10-year ASCVD risk score (Arnett DK, et al., 2019) is: 15.3%   Values used to calculate the score:     Age: 71 years     Sex: Female     Is Non-Hispanic African American: Yes     Diabetic: No     Tobacco smoker: No     Systolic Blood Pressure: 176 mmHg     Is BP treated: Yes     HDL Cholesterol: 46.8 mg/dL     Total Cholesterol: 206 mg/dL  I reviewed the patients updated PMH, FH, and SocHx.    Patient Active Problem List   Diagnosis Date Noted   Chronic neck pain 01/14/2020    Priority: High   Chronic insomnia 01/24/2017    Priority: High   Essential hypertension 11/07/2016    Priority: High   Dysthymia 11/07/2016    Priority: High   Tremor 07/27/2015    Priority: High   Cervical spondylosis without myelopathy 06/04/2015    Priority: High   Primary osteoarthritis of both hands 01/14/2020    Priority: Medium    Primary osteoarthritis of both wrists 01/14/2020    Priority: Medium    Mild intermittent asthma 01/14/2020    Priority: Medium    Gastroesophageal reflux disease without esophagitis 11/07/2016    Priority: Medium    Bilateral carpal tunnel syndrome 06/04/2015    Priority: Medium    Vitamin D deficiency 01/14/2020    Priority: Low   Vitamin B12 deficiency 01/14/2020    Priority: Low   Drusen of macula, bilateral 01/03/2018    Priority: Low   History of anaphylaxis 06/08/2021    Allergies: Cortisone, Erythromycin base, Neurontin [gabapentin], Eggs or egg-derived products, Prednisone, Flexeril [cyclobenzaprine], and Naproxen  Social History: Patient  reports that she quit smoking about 16 years ago. Her smoking use included cigarettes. She has never used smokeless tobacco. She reports that she does not drink alcohol and does not use drugs.  Current Meds  Medication Sig   albuterol (VENTOLIN HFA) 108 (90 Base) MCG/ACT inhaler Inhale 2 puffs into the lungs every 6 (six) hours as needed for wheezing or shortness of breath.    amitriptyline (ELAVIL) 25 MG tablet Take 1 tablet (25 mg total) by mouth at bedtime.   aspirin 81 MG tablet Take 81 mg by mouth daily.   EPINEPHrine 0.3 mg/0.3 mL IJ SOAJ injection Inject 0.3 mg into the muscle as needed for anaphylaxis.   levocetirizine (XYZAL ALLERGY 24HR) 5 MG tablet Take 1 tablet (  5 mg total) by mouth every evening.   traZODone (DESYREL) 50 MG tablet Take 1 tablet (50 mg total) by mouth at bedtime.   [DISCONTINUED] amLODipine (NORVASC) 10 MG tablet Take 1 tablet (10 mg total) by mouth daily.   [DISCONTINUED] metoprolol succinate (TOPROL-XL) 25 MG 24 hr tablet Take 1 tablet by mouth once daily   [DISCONTINUED] sertraline (ZOLOFT) 100 MG tablet Take 1 tablet (100 mg total) by mouth daily.    Review of Systems: Cardiovascular: negative for chest pain, palpitations, leg swelling, orthopnea Respiratory: negative for SOB, wheezing or persistent cough Gastrointestinal: negative for abdominal pain Genitourinary: negative for dysuria or gross hematuria  Objective  Vitals: BP (!) 150/80   Pulse 74   Temp 98.2 F (36.8 C)   Ht '5\' 10"'$  (1.778 m)   Wt 243 lb 12.8 oz (110.6 kg)   SpO2 93%   BMI 34.98 kg/m  General: no acute distress  Psych:  Alert and oriented, normal mood and affect HEENT:  Normocephalic, atraumatic, supple neck  Cardiovascular:  RRR without murmur. no edema Respiratory:  Good breath sounds bilaterally, CTAB with normal respiratory effort Neuro: diffuse resting tremor Commons side effects, risks, benefits, and alternatives for medications and treatment plan prescribed today were discussed, and the patient expressed understanding of the given instructions. Patient is instructed to call or message via MyChart if he/she has any questions or concerns regarding our treatment plan. No barriers to understanding were identified. We discussed Red Flag symptoms and signs in detail. Patient expressed understanding regarding what to do in case of urgent or emergency type  symptoms.  Medication list was reconciled, printed and provided to the patient in AVS. Patient instructions and summary information was reviewed with the patient as documented in the AVS. This note was prepared with assistance of Dragon voice recognition software. Occasional wrong-word or sound-a-like substitutions may have occurred due to the inherent limitations of voice recognition software  This visit occurred during the SARS-CoV-2 public health emergency.  Safety protocols were in place, including screening questions prior to the visit, additional usage of staff PPE, and extensive cleaning of exam room while observing appropriate contact time as indicated for disinfecting solutions.

## 2022-03-08 ENCOUNTER — Ambulatory Visit: Payer: Medicare HMO | Admitting: Family Medicine

## 2022-03-22 ENCOUNTER — Telehealth: Payer: Self-pay | Admitting: Family Medicine

## 2022-03-22 ENCOUNTER — Encounter: Payer: Self-pay | Admitting: Family Medicine

## 2022-03-22 ENCOUNTER — Ambulatory Visit (INDEPENDENT_AMBULATORY_CARE_PROVIDER_SITE_OTHER): Payer: Medicare HMO | Admitting: Family Medicine

## 2022-03-22 VITALS — BP 148/80 | HR 55 | Temp 98.2°F | Ht 70.0 in | Wt 248.4 lb

## 2022-03-22 DIAGNOSIS — R251 Tremor, unspecified: Secondary | ICD-10-CM | POA: Diagnosis not present

## 2022-03-22 DIAGNOSIS — M47812 Spondylosis without myelopathy or radiculopathy, cervical region: Secondary | ICD-10-CM

## 2022-03-22 DIAGNOSIS — Z1231 Encounter for screening mammogram for malignant neoplasm of breast: Secondary | ICD-10-CM

## 2022-03-22 DIAGNOSIS — G8929 Other chronic pain: Secondary | ICD-10-CM

## 2022-03-22 DIAGNOSIS — I10 Essential (primary) hypertension: Secondary | ICD-10-CM | POA: Diagnosis not present

## 2022-03-22 DIAGNOSIS — M542 Cervicalgia: Secondary | ICD-10-CM | POA: Diagnosis not present

## 2022-03-22 DIAGNOSIS — F341 Dysthymic disorder: Secondary | ICD-10-CM

## 2022-03-22 DIAGNOSIS — H00024 Hordeolum internum left upper eyelid: Secondary | ICD-10-CM

## 2022-03-22 MED ORDER — HYDROCHLOROTHIAZIDE 12.5 MG PO CAPS: 12.5000 mg | ORAL_CAPSULE | Freq: Every day | ORAL | 3 refills | Status: DC

## 2022-03-22 MED ORDER — TOBRAMYCIN 0.3 % OP SOLN: 2.0000 [drp] | Freq: Three times a day (TID) | OPHTHALMIC | 0 refills | Status: DC

## 2022-03-22 NOTE — Progress Notes (Signed)
Subjective  CC:  Chief Complaint  Patient presents with   Hypertension   Neck Pain    HPI: Brooke Coleman is a 71 y.o. female who presents to the office today to address the problems listed above in the chief complaint. Hypertension f/u: Control is  improved . Pt reports she is doing well. taking medications as instructed, no medication side effects noted, no TIAs, no chest pain on exertion, no dyspnea on exertion, no swelling of ankles. On metoprolol xl 50 and amlodipine 10. She denies adverse effects from his BP medications. Compliance with medication is good.  Chronic neck pain and shoulder pain: Has been evaluated by neurosurgery and sports medicine, last was 2 years ago, 2021 with Dr. Amalia Hailey.  She does not want surgery so tends to just deal with pain.  But now would like to try acupuncture.  She also has had trigger point injections in the past.  We started Elavil, this may be helping.  Need to monitor closely, possible Parkinson's disease. Tremor: Has not followed up with neurology for several years.  Today, her gait is much better.  Moving easier.  Admits to intermittent stiffness and slowness.  No recent falls Chronic dysthymia: Mood is excellent today.  Recently had a new great grandchild.  Her birthday is tomorrow.  She is not on mood medication. Stye in left upper lid, started 2 days ago.  No visual disturbance.  Assessment  1. Essential hypertension   2. Tremor   3. Cervical spondylosis without myelopathy   4. Chronic neck pain   5. Hordeolum internum of left upper eyelid   6. Dysthymia      Plan   Hypertension f/u: BP control is fairly well controlled.  It has improved and she is compliant with medications now.  We will add HCTZ 12.5 mg daily to metoprolol XL 50 and amlodipine 10.  We will recheck in 3 months. Tremor: Would like her to follow-up with neurology. Chronic neck pain, DJD: She will try acupuncture.  Recommend referral back to sports medicine if not improving.   For now, continue Elavil nightly Tobramycin eyedrops to treat hordeolum, left eye Dysthymia: Today feeling much brighter overall. Health maintenance: Overdue for mammogram.  Ordered  Education regarding management of these chronic disease states was given. Management strategies discussed on successive visits include dietary and exercise recommendations, goals of achieving and maintaining IBW, and lifestyle modifications aiming for adequate sleep and minimizing stressors.   Follow up: 3 months for complete physical  No orders of the defined types were placed in this encounter.  Meds ordered this encounter  Medications   hydrochlorothiazide (MICROZIDE) 12.5 MG capsule    Sig: Take 1 capsule (12.5 mg total) by mouth daily.    Dispense:  90 capsule    Refill:  3   tobramycin (TOBREX) 0.3 % ophthalmic solution    Sig: Place 2 drops into the left eye in the morning, at noon, and at bedtime.    Dispense:  5 mL    Refill:  0      BP Readings from Last 3 Encounters:  03/22/22 (!) 148/80  12/07/21 (!) 150/80  06/08/21 (!) 144/90   Wt Readings from Last 3 Encounters:  03/22/22 248 lb 6.4 oz (112.7 kg)  12/07/21 243 lb 12.8 oz (110.6 kg)  06/08/21 242 lb 12.8 oz (110.1 kg)    Lab Results  Component Value Date   CHOL 206 (H) 06/14/2021   CHOL 226 (H) 01/14/2020   CHOL 236 (  H) 04/02/2019   Lab Results  Component Value Date   HDL 46.80 06/14/2021   HDL 52 01/14/2020   HDL 44.80 04/02/2019   Lab Results  Component Value Date   LDLCALC 131 (H) 06/14/2021   LDLCALC 150 (H) 01/14/2020   LDLCALC 164 (H) 04/02/2019   Lab Results  Component Value Date   TRIG 139.0 06/14/2021   TRIG 122 01/14/2020   TRIG 138.0 04/02/2019   Lab Results  Component Value Date   CHOLHDL 4 06/14/2021   CHOLHDL 4.3 01/14/2020   CHOLHDL 5 04/02/2019   No results found for: "LDLDIRECT" Lab Results  Component Value Date   CREATININE 0.73 06/14/2021   BUN 13 06/14/2021   NA 140 06/14/2021   K  4.2 06/14/2021   CL 104 06/14/2021   CO2 28 06/14/2021    The 10-year ASCVD risk score (Arnett DK, et al., 2019) is: 14.9%   Values used to calculate the score:     Age: 16 years     Sex: Female     Is Non-Hispanic African American: Yes     Diabetic: No     Tobacco smoker: No     Systolic Blood Pressure: 329 mmHg     Is BP treated: Yes     HDL Cholesterol: 46.8 mg/dL     Total Cholesterol: 206 mg/dL  I reviewed the patients updated PMH, FH, and SocHx.    Patient Active Problem List   Diagnosis Date Noted   Chronic neck pain 01/14/2020    Priority: High   Chronic insomnia 01/24/2017    Priority: High   Essential hypertension 11/07/2016    Priority: High   Dysthymia 11/07/2016    Priority: High   Tremor 07/27/2015    Priority: High   Cervical spondylosis without myelopathy 06/04/2015    Priority: High   Primary osteoarthritis of both hands 01/14/2020    Priority: Medium    Primary osteoarthritis of both wrists 01/14/2020    Priority: Medium    Mild intermittent asthma 01/14/2020    Priority: Medium    Gastroesophageal reflux disease without esophagitis 11/07/2016    Priority: Medium    Bilateral carpal tunnel syndrome 06/04/2015    Priority: Medium    Vitamin D deficiency 01/14/2020    Priority: Low   Vitamin B12 deficiency 01/14/2020    Priority: Low   Drusen of macula, bilateral 01/03/2018    Priority: Low   History of anaphylaxis 06/08/2021    Allergies: Cortisone, Erythromycin, Neurontin [gabapentin], Eggs or egg-derived products, Prednisone, Flexeril [cyclobenzaprine], and Naproxen  Social History: Patient  reports that she quit smoking about 16 years ago. Her smoking use included cigarettes. She has never used smokeless tobacco. She reports that she does not drink alcohol and does not use drugs.  Current Meds  Medication Sig   albuterol (VENTOLIN HFA) 108 (90 Base) MCG/ACT inhaler Inhale 2 puffs into the lungs every 6 (six) hours as needed for wheezing  or shortness of breath.   amitriptyline (ELAVIL) 25 MG tablet Take 1 tablet (25 mg total) by mouth at bedtime.   amLODipine (NORVASC) 10 MG tablet Take 1 tablet (10 mg total) by mouth daily.   aspirin 81 MG tablet Take 81 mg by mouth daily.   EPINEPHrine 0.3 mg/0.3 mL IJ SOAJ injection Inject 0.3 mg into the muscle as needed for anaphylaxis.   hydrochlorothiazide (MICROZIDE) 12.5 MG capsule Take 1 capsule (12.5 mg total) by mouth daily.   levocetirizine (XYZAL ALLERGY 24HR) 5 MG  tablet Take 1 tablet (5 mg total) by mouth every evening.   metoprolol succinate (TOPROL-XL) 50 MG 24 hr tablet Take 1 tablet (50 mg total) by mouth daily.   sertraline (ZOLOFT) 100 MG tablet Take 1 tablet (100 mg total) by mouth daily.   tobramycin (TOBREX) 0.3 % ophthalmic solution Place 2 drops into the left eye in the morning, at noon, and at bedtime.   traZODone (DESYREL) 50 MG tablet Take 1 tablet (50 mg total) by mouth at bedtime.    Review of Systems: Cardiovascular: negative for chest pain, palpitations, leg swelling, orthopnea Respiratory: negative for SOB, wheezing or persistent cough Gastrointestinal: negative for abdominal pain Genitourinary: negative for dysuria or gross hematuria  Objective  Vitals: BP (!) 148/80   Pulse (!) 55   Temp 98.2 F (36.8 C)   Ht '5\' 10"'$  (1.778 m)   Wt 248 lb 6.4 oz (112.7 kg)   SpO2 99%   BMI 35.64 kg/m  General: no acute distress  Psych:  Alert and oriented, normal mood and affect, smiling and appears happy today. HEENT:  Normocephalic, atraumatic, supple neck, left upper lid with small stye Cardiovascular:  RRR without murmur. no edema Respiratory:  Good breath sounds bilaterally, CTAB with normal respiratory effort Skin:  Warm, no rashes Neurologic:   Mental status is normal, bilateral resting tremors present, no cogwheeling on exam today.  Gait: Moving well today Commons side effects, risks, benefits, and alternatives for medications and treatment plan  prescribed today were discussed, and the patient expressed understanding of the given instructions. Patient is instructed to call or message via MyChart if he/she has any questions or concerns regarding our treatment plan. No barriers to understanding were identified. We discussed Red Flag symptoms and signs in detail. Patient expressed understanding regarding what to do in case of urgent or emergency type symptoms.  Medication list was reconciled, printed and provided to the patient in AVS. Patient instructions and summary information was reviewed with the patient as documented in the AVS. This note was prepared with assistance of Dragon voice recognition software. Occasional wrong-word or sound-a-like substitutions may have occurred due to the inherent limitation

## 2022-03-22 NOTE — Telephone Encounter (Signed)
Pt states: -[facility] does not have hydrochlorothiazide (MICROZIDE) 12.5 MG capsule [[id number]]  -She went down and asked for it.   Pt acknowledges that electronic system shows [facility] received the Rx at 11:07am on 03/22/22   Pt requests: -PCP team to send Rx to [facility].

## 2022-03-22 NOTE — Patient Instructions (Addendum)
Please return in 3 months for your annual complete physical; please come fasting.   I have add a low dose fluid pill called HCTZ; please take it along with your other blood pressure medications to get your levels perfect.   See the neurologist at [facility] to recheck your tremors.  I'm glad you are feeling better overall.   HAPPY HAPPY BIRTHDAY!  If you have any questions or concerns, please don't hesitate to send me a message via MyChart or call the office at [phone]. Thank you for visiting with Brooke Coleman today! It's our pleasure caring for you.   Stye A stye, also known as a hordeolum, is a bump that forms on an eyelid. It may look like a pimple next to the eyelash. A stye can form inside the eyelid (internal stye) or outside the eyelid (external stye). A stye can cause redness, swelling, and pain on the eyelid. Styes are very common. Anyone can get them at any age. They usually occur in just one eye at a time, but you may have more than one in either eye. What are the causes? A stye is caused by an infection. The infection is almost always caused by bacteria called Staphylococcus aureus. This is a common type of bacteria that lives on the skin. An internal stye may result from an infected oil-producing gland inside the eyelid. An external stye may be caused by an infection at the base of the eyelash (hair follicle). What increases the risk? You are more likely to develop a stye if: You have had a stye before. You have any of these conditions: Red, itchy, inflamed eyelids (blepharitis). A skin condition such as seborrheic dermatitis or rosacea. High fat levels in your blood (lipids). Dry eyes. What are the signs or symptoms? The most common symptom of a stye is eyelid pain. Internal styes are more painful than external styes. Other symptoms may include: Painful swelling of your eyelid. A scratchy feeling in your eye. Tearing and redness of your eye. A pimple-like  bump on the edge of the eyelid. Pus draining from the stye. How is this diagnosed? Your health care provider may be able to diagnose a stye just by examining your eye. The health care provider may also check to make sure: You do not have a fever or other signs of a more serious infection. The infection has not spread to other parts of your eye or areas around your eye. How is this treated? Most styes will clear up in a few days without treatment or with warm compresses applied to the area. You may need to use antibiotic drops or ointment to treat an infection. Sometimes, steroid drops or ointment are used in addition to antibiotics. In some cases, your health care provider may give you a small steroid injection in the eyelid. If your stye does not heal with routine treatment, your health care provider may drain pus from the stye using a thin blade or needle. This may be done if the stye is large, causing a lot of pain, or affecting your vision. Follow these instructions at home: Take over-the-counter and prescription medicines only as told by your health care provider. This includes eye drops or ointments. If you were prescribed an antibiotic medicine, steroid medicine, or both, apply or use them as told by your health care provider. Do not stop using the medicine even if your condition improves. Apply a warm, wet cloth (warm compress) to your eye for 5-10 minutes, 4 to 6  times a day. Clean the affected eyelid as directed by your health care provider. Do not wear contact lenses or eye makeup until your stye has healed and your health care provider says that it is safe. Do not try to pop or drain the stye. Do not rub your eye. Contact a health care provider if: You have chills or a fever. Your stye does not go away after several days. Your stye affects your vision. Your eyeball becomes swollen, red, or painful. Get help right away if: You have pain when moving your eye around. Summary A stye  is a bump that forms on an eyelid. It may look like a pimple next to the eyelash. A stye can form inside the eyelid (internal stye) or outside the eyelid (external stye). A stye can cause redness, swelling, and pain on the eyelid. Your health care provider may be able to diagnose a stye just by examining your eye. Apply a warm, wet cloth (warm compress) to your eye for 5-10 minutes, 4 to 6 times a day. This information is not intended to replace advice given to you by your health care provider. Make sure you discuss any questions you have with your health care provider. Document Revised: 08/04/2020 Document Reviewed: 08/04/2020 Elsevier Patient Education  [employer].

## 2022-03-22 NOTE — Telephone Encounter (Signed)
Pt will contact the office tomorrow if it is not in stock at the [facility]

## 2022-03-29 ENCOUNTER — Other Ambulatory Visit: Payer: Self-pay | Admitting: Family Medicine

## 2022-03-29 DIAGNOSIS — R921 Mammographic calcification found on diagnostic imaging of breast: Secondary | ICD-10-CM

## 2022-04-21 LAB — HM DIABETES EYE EXAM

## 2022-04-25 ENCOUNTER — Encounter: Payer: Self-pay | Admitting: Family Medicine

## 2022-05-10 ENCOUNTER — Other Ambulatory Visit: Payer: Self-pay

## 2022-05-10 ENCOUNTER — Encounter (HOSPITAL_BASED_OUTPATIENT_CLINIC_OR_DEPARTMENT_OTHER): Payer: Self-pay

## 2022-05-10 ENCOUNTER — Emergency Department (HOSPITAL_BASED_OUTPATIENT_CLINIC_OR_DEPARTMENT_OTHER)
Admission: EM | Admit: 2022-05-10 | Discharge: 2022-05-10 | Disposition: A | Discharge status: Home / Self Care | Payer: Medicare HMO | Attending: Emergency Medicine | Admitting: Emergency Medicine

## 2022-05-10 DIAGNOSIS — R197 Diarrhea, unspecified: Secondary | ICD-10-CM | POA: Insufficient documentation

## 2022-05-10 DIAGNOSIS — Z7982 Long term (current) use of aspirin: Secondary | ICD-10-CM | POA: Insufficient documentation

## 2022-05-10 DIAGNOSIS — R112 Nausea with vomiting, unspecified: Secondary | ICD-10-CM | POA: Diagnosis not present

## 2022-05-10 DIAGNOSIS — R1033 Periumbilical pain: Secondary | ICD-10-CM | POA: Insufficient documentation

## 2022-05-10 LAB — MAGNESIUM: Magnesium: 1.9 mg/dL (ref 1.7–2.4)

## 2022-05-10 LAB — COMPREHENSIVE METABOLIC PANEL
ALT: 18 U/L (ref 0–44)
AST: 21 U/L (ref 15–41)
Albumin: 4.6 g/dL (ref 3.5–5.0)
Alkaline Phosphatase: 85 U/L (ref 38–126)
Anion gap: 13 (ref 5–15)
BUN: 10 mg/dL (ref 8–23)
CO2: 24 mmol/L (ref 22–32)
Calcium: 9.5 mg/dL (ref 8.9–10.3)
Chloride: 104 mmol/L (ref 98–111)
Creatinine, Ser: 0.82 mg/dL (ref 0.44–1.00)
GFR, Estimated: >60 mL/min (ref >60)
Glucose, Bld: 100 mg/dL — ABNORMAL HIGH (ref 70–99)
Potassium: 3.5 mmol/L (ref 3.5–5.1)
Sodium: 141 mmol/L (ref 135–145)
Total Bilirubin: 0.9 mg/dL (ref 0.3–1.2)
Total Protein: 8.3 g/dL — ABNORMAL HIGH (ref 6.5–8.1)

## 2022-05-10 LAB — LIPASE, BLOOD: Lipase: <10 U/L — ABNORMAL LOW (ref 11–51)

## 2022-05-10 LAB — CBC
HCT: 43.5 % (ref 36.0–46.0)
Hemoglobin: 14.8 g/dL (ref 12.0–15.0)
MCH: 31.8 pg (ref 26.0–34.0)
MCHC: 34 g/dL (ref 30.0–36.0)
MCV: 93.3 fL (ref 80.0–100.0)
Platelets: 262 10*3/uL (ref 150–400)
RBC: 4.66 MIL/uL (ref 3.87–5.11)
RDW: 12.9 % (ref 11.5–15.5)
WBC: 5.9 10*3/uL (ref 4.0–10.5)
nRBC: 0 % (ref 0.0–0.2)

## 2022-05-10 MED ORDER — SODIUM CHLORIDE 0.9 % IV BOLUS
1000.0000 mL | Freq: Once | INTRAVENOUS | Status: AC
  Administered 2022-05-10: 1000 mL via INTRAVENOUS

## 2022-05-10 MED ORDER — LOPERAMIDE HCL 2 MG PO CAPS
4.0000 mg | ORAL_CAPSULE | Freq: Once | ORAL | Status: AC
  Administered 2022-05-10: 4 mg via ORAL
  Filled 2022-05-10: qty 2

## 2022-05-10 MED ORDER — MORPHINE SULFATE (PF) 2 MG/ML IV SOLN
2.0000 mg | Freq: Once | INTRAVENOUS | Status: AC
  Administered 2022-05-10: 2 mg via INTRAVENOUS
  Filled 2022-05-10: qty 1

## 2022-05-10 MED ORDER — ONDANSETRON HCL 4 MG/2ML IJ SOLN
4.0000 mg | Freq: Once | INTRAMUSCULAR | Status: AC
  Administered 2022-05-10: 4 mg via INTRAVENOUS
  Filled 2022-05-10: qty 2

## 2022-05-10 NOTE — Discharge Instructions (Signed)
Take imodium for diarrhea.  Return for worsening abdominal pain fever or inability eat or drink.  Please follow-up with your doctor in the office.

## 2022-05-10 NOTE — ED Triage Notes (Signed)
Pt states she is having abdominal pain and has had diarrhea since Saturday. Also has had nausea. States she can't eat because it runs through her.

## 2022-05-10 NOTE — ED Provider Notes (Signed)
[facility] EMERGENCY DEPT Provider Note   CSN: [id number] Arrival date & time: 05/10/22  0944     History  Chief Complaint  Patient presents with   Abdominal Pain    Brooke Coleman is a 71 y.o. female.  71 yo F with a chief complaints of nausea vomiting and diarrhea.  This been going on for about 5 days now.  She thinks she ate some potatoes from [employer] that were bad.  She denies anyone else with a similar illness though her husband mentions several times while she was speaking that he also had diarrhea.  She denies cough or congestion.  Feels like she has abdominal pain that is periumbilical.  She has been not wanting to eat or drink because she feels like she has persistent diarrhea afterwards.  Denies recent antibiotic use.   Abdominal Pain      Home Medications Prior to Admission medications   Medication Sig Start Date End Date Taking? Authorizing Provider  albuterol (VENTOLIN HFA) 108 (90 Base) MCG/ACT inhaler Inhale 2 puffs into the lungs every 6 (six) hours as needed for wheezing or shortness of breath. 06/15/20   Inda Coke, PA  amitriptyline (ELAVIL) 25 MG tablet Take 1 tablet (25 mg total) by mouth at bedtime. 12/07/21   Leamon Arnt, MD  amLODipine (NORVASC) 10 MG tablet Take 1 tablet (10 mg total) by mouth daily. 12/07/21   Leamon Arnt, MD  aspirin 81 MG tablet Take 81 mg by mouth daily.    [provider]  EPINEPHrine 0.3 mg/0.3 mL IJ SOAJ injection Inject 0.3 mg into the muscle as needed for anaphylaxis. 06/08/21   Leamon Arnt, MD  hydrochlorothiazide (MICROZIDE) 12.5 MG capsule Take 1 capsule (12.5 mg total) by mouth daily. 03/22/22   Leamon Arnt, MD  levocetirizine Harlow Ohms ALLERGY 24HR) 5 MG tablet Take 1 tablet (5 mg total) by mouth every evening. 12/01/20   Leamon Arnt, MD  metoprolol succinate (TOPROL-XL) 50 MG 24 hr tablet Take 1 tablet (50 mg total) by mouth daily. 12/07/21   Leamon Arnt, MD  sertraline  (ZOLOFT) 100 MG tablet Take 1 tablet (100 mg total) by mouth daily. 12/07/21   Leamon Arnt, MD  tobramycin (TOBREX) 0.3 % ophthalmic solution Place 2 drops into the left eye in the morning, at noon, and at bedtime. 03/22/22   Leamon Arnt, MD  traZODone (DESYREL) 50 MG tablet Take 1 tablet (50 mg total) by mouth at bedtime. 06/08/21   Leamon Arnt, MD      Allergies    Cortisone, Erythromycin, Neurontin [gabapentin], Eggs or egg-derived products, Prednisone, Flexeril [cyclobenzaprine], and Naproxen    Review of Systems   Review of Systems  Gastrointestinal:  Positive for abdominal pain.    Physical Exam Updated Vital Signs BP 136/77   Pulse 71   Temp 98.1 F (36.7 C) (Oral)   Resp 18   Ht '5\' 10"'$  (1.778 m)   Wt 112.7 kg   SpO2 96%   BMI 35.65 kg/m  Physical Exam Vitals and nursing note reviewed.  Constitutional:      General: She is not in acute distress.    Appearance: She is well-developed. She is not diaphoretic.  HENT:     Head: Normocephalic and atraumatic.  Eyes:     Pupils: Pupils are equal, round, and reactive to light.  Cardiovascular:     Rate and Rhythm: Normal rate and regular rhythm.     Heart  sounds: No murmur heard.    No friction rub. No gallop.  Pulmonary:     Effort: Pulmonary effort is normal.     Breath sounds: No wheezing or rales.  Abdominal:     General: There is no distension.     Palpations: Abdomen is soft.     Tenderness: There is no abdominal tenderness.     Comments: Benign abdominal exam  Musculoskeletal:        General: No tenderness.     Cervical back: Normal range of motion and neck supple.  Skin:    General: Skin is warm and dry.  Neurological:     Mental Status: She is alert and oriented to person, place, and time.     Comments: Coarse tremor to the bilateral upper extremities.  Psychiatric:        Behavior: Behavior normal.     ED Results / Procedures / Treatments   Labs (all labs ordered are listed, but only  abnormal results are displayed) Labs Reviewed  LIPASE, BLOOD - Abnormal; Notable for the following components:      Result Value   Lipase <10 (*)    All other components within normal limits  COMPREHENSIVE METABOLIC PANEL - Abnormal; Notable for the following components:   Glucose, Bld 100 (*)    Total Protein 8.3 (*)    All other components within normal limits  GASTROINTESTINAL PANEL BY PCR, STOOL (REPLACES STOOL CULTURE)  C DIFFICILE QUICK SCREEN W PCR REFLEX    CBC  MAGNESIUM  URINALYSIS, ROUTINE W REFLEX MICROSCOPIC    EKG None  Radiology No results found.  Procedures Procedures    Medications Ordered in ED Medications  loperamide (IMODIUM) capsule 4 mg (has no administration in time range)  sodium chloride 0.9 % bolus 1,000 mL (0 mLs Intravenous Stopped 05/10/22 1150)  morphine (PF) 2 MG/ML injection 2 mg (2 mg Intravenous Given 05/10/22 1054)  ondansetron (ZOFRAN) injection 4 mg (4 mg Intravenous Given 05/10/22 1053)    ED Course/ Medical Decision Making/ A&P                           Medical Decision Making Amount and/or Complexity of Data Reviewed Labs: ordered.  Risk Prescription drug management.   71 yo F with a chief complaints of nausea vomiting and diarrhea.  By history it sounds like she is most concerned about the diarrhea.  She has limited her oral intake because she says every time she eats she has more diarrhea.  She has tried Pepto-Bismol without significant improvement.  No fevers.  She said her stool became dark after the Pepto-Bismol otherwise denies obvious blood in her stool.  She has a fairly benign abdominal exam for me.  Will treat pain and nausea here.  Laboratory evaluation.  Reassess.  Patient feeling a bit better on reassessment.  Lab work without's acute kidney injury and no signs of electrolyte derangement.  Will DC home.  Imodium for diarrhea.  I suspect its unlikely that the patient has infectious diarrhea or C. difficile as she  has not had a bowel movement since she has been here.  She is otherwise well-appearing and nontoxic.  12:31 PM:  I have discussed the diagnosis/risks/treatment options with the patient and family.  Evaluation and diagnostic testing in the emergency department does not suggest an emergent condition requiring admission or immediate intervention beyond what has been performed at this time.  They will follow up with  PCP. We also discussed returning to the ED immediately if new or worsening sx occur. We discussed the sx which are most concerning (e.g., sudden worsening pain, fever, inability to tolerate by mouth) that necessitate immediate return. Medications administered to the patient during their visit and any new prescriptions provided to the patient are listed below.  Medications given during this visit Medications  loperamide (IMODIUM) capsule 4 mg (has no administration in time range)  sodium chloride 0.9 % bolus 1,000 mL (0 mLs Intravenous Stopped 05/10/22 1150)  morphine (PF) 2 MG/ML injection 2 mg (2 mg Intravenous Given 05/10/22 1054)  ondansetron (ZOFRAN) injection 4 mg (4 mg Intravenous Given 05/10/22 1053)     The patient appears reasonably screen and/or stabilized for discharge and I doubt any other medical condition or other [facility] requiring further screening, evaluation, or treatment in the ED at this time prior to discharge.          Final Clinical Impression(s) / ED Diagnoses Final diagnoses:  Diarrhea, unspecified type    Rx / DC Orders ED Discharge Orders     None         Deno Etienne, DO 05/10/22 1231

## 2022-06-21 ENCOUNTER — Encounter: Payer: Medicare HMO | Admitting: Family Medicine

## 2022-07-16 ENCOUNTER — Other Ambulatory Visit: Payer: Self-pay

## 2022-07-16 ENCOUNTER — Emergency Department (HOSPITAL_COMMUNITY)
Admission: EM | Admit: 2022-07-16 | Discharge: 2022-07-17 | Disposition: A | Discharge status: Home / Self Care | Payer: Medicare HMO | Attending: Emergency Medicine | Admitting: Emergency Medicine

## 2022-07-16 DIAGNOSIS — I10 Essential (primary) hypertension: Secondary | ICD-10-CM | POA: Insufficient documentation

## 2022-07-16 DIAGNOSIS — E876 Hypokalemia: Secondary | ICD-10-CM | POA: Diagnosis not present

## 2022-07-16 DIAGNOSIS — Z79899 Other long term (current) drug therapy: Secondary | ICD-10-CM | POA: Diagnosis not present

## 2022-07-16 DIAGNOSIS — R42 Dizziness and giddiness: Secondary | ICD-10-CM | POA: Insufficient documentation

## 2022-07-16 DIAGNOSIS — Z7982 Long term (current) use of aspirin: Secondary | ICD-10-CM | POA: Insufficient documentation

## 2022-07-16 DIAGNOSIS — E860 Dehydration: Secondary | ICD-10-CM

## 2022-07-16 DIAGNOSIS — J45909 Unspecified asthma, uncomplicated: Secondary | ICD-10-CM | POA: Insufficient documentation

## 2022-07-16 DIAGNOSIS — R262 Difficulty in walking, not elsewhere classified: Secondary | ICD-10-CM | POA: Diagnosis not present

## 2022-07-16 NOTE — ED Provider Notes (Signed)
[facility] Provider Note   CSN: [id number] Arrival date & time: 07/16/22  2333     History {Add pertinent medical, surgical, social history, OB history to HPI:1} Chief Complaint  Patient presents with   Dizziness    Brooke Coleman is a 72 y.o. female.  The history is provided by the patient.  Dizziness She has history of hypertension, hyperlipidemia, asthma, tremors and comes in because of feeling like she is going to fall out when she sits up or stands up.  That started this afternoon and was worse this evening.  She denies any spinning sensation or nausea or vomiting.  She denies any chest pain, heaviness, tightness, pressure.  She has chronic neck and back pain which are unchanged.  She denies any other pain.  She denies fever or chills.  She did take aspirin 325 mg at home which did not help her.   Home Medications Prior to Admission medications   Medication Sig Start Date End Date Taking? Authorizing Provider  albuterol (VENTOLIN HFA) 108 (90 Base) MCG/ACT inhaler Inhale 2 puffs into the lungs every 6 (six) hours as needed for wheezing or shortness of breath. 06/15/20   Inda Coke, PA  amitriptyline (ELAVIL) 25 MG tablet Take 1 tablet (25 mg total) by mouth at bedtime. 12/07/21   Leamon Arnt, MD  amLODipine (NORVASC) 10 MG tablet Take 1 tablet (10 mg total) by mouth daily. 12/07/21   Leamon Arnt, MD  aspirin 81 MG tablet Take 81 mg by mouth daily.    [provider]  EPINEPHrine 0.3 mg/0.3 mL IJ SOAJ injection Inject 0.3 mg into the muscle as needed for anaphylaxis. 06/08/21   Leamon Arnt, MD  hydrochlorothiazide (MICROZIDE) 12.5 MG capsule Take 1 capsule (12.5 mg total) by mouth daily. 03/22/22   Leamon Arnt, MD  levocetirizine Harlow [employer]ms ALLERGY 24HR) 5 MG tablet Take 1 tablet (5 mg total) by mouth every evening. 12/01/20   Leamon Arnt, MD  metoprolol succinate (TOPROL-XL) 50 MG 24 hr tablet Take 1 tablet (50  mg total) by mouth daily. 12/07/21   Leamon Arnt, MD  sertraline (ZOLOFT) 100 MG tablet Take 1 tablet (100 mg total) by mouth daily. 12/07/21   Leamon Arnt, MD  tobramycin (TOBREX) 0.3 % ophthalmic solution Place 2 drops into the left eye in the morning, at noon, and at bedtime. 03/22/22   Leamon Arnt, MD  traZODone (DESYREL) 50 MG tablet Take 1 tablet (50 mg total) by mouth at bedtime. 06/08/21   Leamon Arnt, MD      Allergies    Cortisone, Erythromycin, Neurontin [gabapentin], Eggs or egg-derived products, Prednisone, Flexeril [cyclobenzaprine], and Naproxen    Review of Systems   Review of Systems  Neurological:  Positive for dizziness.  All other systems reviewed and are negative.   Physical Exam Updated Vital Signs BP (!) 128/106 (BP Location: Left Arm)   Pulse 81   Temp 97.9 F (36.6 C) (Temporal)   Resp 19   SpO2 100%  Physical Exam Vitals and nursing note reviewed.   72 year old female, resting comfortably and in no acute distress. Vital signs are significant for elevated diastolic blood pressure. Oxygen saturation is 100%, which is normal. Head is normocephalic and atraumatic. PERRLA, EOMI. Oropharynx is clear.  No conjunctival pallor noted. Neck is nontender and supple without adenopathy or JVD.  There are no carotid bruits. Back is nontender and there is no  CVA tenderness. Lungs are clear without rales, wheezes, or rhonchi. Chest is nontender. Heart has regular rate and rhythm without murmur. Abdomen is soft, flat, nontender. Extremities have no cyanosis or edema, full range of motion is present. Skin is warm and dry without rash. Neurologic: Mental status is normal, cranial nerves are intact, strength is 5/5 in all 4 extremities, finger-nose testing is normal.  Prominent resting tremor is present.  Dizziness is not reproduced by passive head movement.  ED Results / Procedures / Treatments   Labs (all labs ordered are listed, but only abnormal results  are displayed) Labs Reviewed  COMPREHENSIVE METABOLIC PANEL  CBC WITH DIFFERENTIAL/PLATELET  URINALYSIS, ROUTINE W REFLEX MICROSCOPIC  TROPONIN I (HIGH SENSITIVITY)    EKG EKG Interpretation  Date/Time:  Sunday July 16 2022 23:48:41 EST Ventricular Rate:  77 PR Interval:    QRS Duration: 164 QT Interval:  351 QTC Calculation: 398 R Axis:   0 Text Interpretation: Sinus rhythm Artifact When compared with ECG of 4/263/2019, No significant change was found Confirmed by Delora Fuel ([id number]) on 07/16/2022 11:54:07 PM  Radiology No results found.  Procedures Procedures  Cardiac monitor shows normal sinus rhythm with prominent artifact, per my interpretation.  Medications Ordered in ED Medications - No data to display  ED Course/ Medical Decision Making/ A&P   {   Click here for ABCD2, HEART and other calculatorsREFRESH Note before signing :1}                          Medical Decision Making Amount and/or Complexity of Data Reviewed Labs: ordered.   Orthostatic dizziness.  Consider occult ACS, dehydration, anemia, electrolyte disturbance.  I have ordered screening labs of CBC, comprehensive metabolic panel, troponin x 2.  I have reviewed and interpreted her electrocardiogram and my interpretation is normal sinus rhythm but prominent muscle artifact making interpretation difficult.  I have ordered orthostatic vital signs.  {Document critical care time when appropriate:1} {Document review of labs and clinical decision tools ie heart score, Chads2Vasc2 etc:1}  {Document your independent review of radiology images, and any outside records:1} {Document your discussion with family members, caretakers, and with consultants:1} {Document social determinants of health affecting pt's care:1} {Document your decision making why or why not admission, treatments were needed:1} Final Clinical Impression(s) / ED Diagnoses Final diagnoses:  None    Rx / DC Orders ED Discharge Orders      None

## 2022-07-16 NOTE — ED Triage Notes (Signed)
Pt BIB [facility] from home for sudden onset of dizziness. Pt states she felt "off" all day, but got dizzy when standing at 2204. Pt attempted to stand multiple times and was unable to. Pt took aspirin PO prior to EMS arrival. Pt does have a tremor; per family she has tremors when in pain.   2.'5mg'$  Versed given in route.

## 2022-07-17 ENCOUNTER — Emergency Department (HOSPITAL_COMMUNITY): Payer: Medicare HMO

## 2022-07-17 LAB — COMPREHENSIVE METABOLIC PANEL
ALT: 14 U/L (ref 0–44)
AST: 19 U/L (ref 15–41)
Albumin: 3.7 g/dL (ref 3.5–5.0)
Alkaline Phosphatase: 96 U/L (ref 38–126)
Anion gap: 10 (ref 5–15)
BUN: 13 mg/dL (ref 8–23)
CO2: 24 mmol/L (ref 22–32)
Calcium: 8.7 mg/dL — ABNORMAL LOW (ref 8.9–10.3)
Chloride: 103 mmol/L (ref 98–111)
Creatinine, Ser: 1.08 mg/dL — ABNORMAL HIGH (ref 0.44–1.00)
GFR, Estimated: 56 mL/min — ABNORMAL LOW (ref >60)
Glucose, Bld: 104 mg/dL — ABNORMAL HIGH (ref 70–99)
Potassium: 3.3 mmol/L — ABNORMAL LOW (ref 3.5–5.1)
Sodium: 137 mmol/L (ref 135–145)
Total Bilirubin: 0.6 mg/dL (ref 0.3–1.2)
Total Protein: 7 g/dL (ref 6.5–8.1)

## 2022-07-17 LAB — CBC WITH DIFFERENTIAL/PLATELET
Abs Immature Granulocytes: 0.01 10*3/uL (ref 0.00–0.07)
Basophils Absolute: 0.1 10*3/uL (ref 0.0–0.1)
Basophils Relative: 1 %
Eosinophils Absolute: 0.4 10*3/uL (ref 0.0–0.5)
Eosinophils Relative: 6 %
HCT: 41 % (ref 36.0–46.0)
Hemoglobin: 13.6 g/dL (ref 12.0–15.0)
Immature Granulocytes: 0 %
Lymphocytes Relative: 30 %
Lymphs Abs: 2 10*3/uL (ref 0.7–4.0)
MCH: 31.7 pg (ref 26.0–34.0)
MCHC: 33.2 g/dL (ref 30.0–36.0)
MCV: 95.6 fL (ref 80.0–100.0)
Monocytes Absolute: 0.6 10*3/uL (ref 0.1–1.0)
Monocytes Relative: 9 %
Neutro Abs: 3.7 10*3/uL (ref 1.7–7.7)
Neutrophils Relative %: 54 %
Platelets: 271 10*3/uL (ref 150–400)
RBC: 4.29 MIL/uL (ref 3.87–5.11)
RDW: 12.7 % (ref 11.5–15.5)
WBC: 6.8 10*3/uL (ref 4.0–10.5)
nRBC: 0 % (ref 0.0–0.2)

## 2022-07-17 LAB — TROPONIN I (HIGH SENSITIVITY)
Troponin I (High Sensitivity): 4 ng/L (ref <18)
Troponin I (High Sensitivity): 6 ng/L (ref <18)

## 2022-07-17 LAB — URINALYSIS, ROUTINE W REFLEX MICROSCOPIC
Bilirubin Urine: NEGATIVE
Glucose, UA: NEGATIVE mg/dL
Hgb urine dipstick: NEGATIVE
Ketones, ur: NEGATIVE mg/dL
Nitrite: NEGATIVE
Protein, ur: NEGATIVE mg/dL
Specific Gravity, Urine: 1.019 (ref 1.005–1.030)
pH: 5 (ref 5.0–8.0)

## 2022-07-17 LAB — MAGNESIUM: Magnesium: 2.1 mg/dL (ref 1.7–2.4)

## 2022-07-17 MED ORDER — LACTATED RINGERS IV BOLUS
500.0000 mL | Freq: Once | INTRAVENOUS | Status: AC
  Administered 2022-07-17: 500 mL via INTRAVENOUS

## 2022-07-17 MED ORDER — MECLIZINE HCL 12.5 MG PO TABS: 12.5000 mg | ORAL_TABLET | Freq: Three times a day (TID) | ORAL | 0 refills | Status: DC | PRN

## 2022-07-17 MED ORDER — LACTATED RINGERS IV BOLUS
1000.0000 mL | Freq: Once | INTRAVENOUS | Status: AC
  Administered 2022-07-17: 1000 mL via INTRAVENOUS

## 2022-07-17 MED ORDER — LORAZEPAM 2 MG/ML IJ SOLN
1.0000 mg | Freq: Once | INTRAMUSCULAR | Status: AC
  Administered 2022-07-17: 1 mg via INTRAVENOUS
  Filled 2022-07-17: qty 1

## 2022-07-17 MED ORDER — POTASSIUM CHLORIDE CRYS ER 20 MEQ PO TBCR
40.0000 meq | EXTENDED_RELEASE_TABLET | Freq: Once | ORAL | Status: AC
  Administered 2022-07-17: 40 meq via ORAL
  Filled 2022-07-17: qty 2

## 2022-07-17 MED ORDER — MECLIZINE HCL 25 MG PO TABS
25.0000 mg | ORAL_TABLET | Freq: Once | ORAL | Status: AC
  Administered 2022-07-17: 25 mg via ORAL
  Filled 2022-07-17: qty 1

## 2022-07-17 NOTE — ED Notes (Signed)
Lab stated they will add mag level on

## 2022-07-17 NOTE — Discharge Instructions (Addendum)
A prescription for meclizine was sent to your pharmacy.  Take this as needed for dizziness.  Hold off on taking your hydrochlorothiazide until you discuss further with your primary care doctor.  Return to the emergency department for any new or worsening symptoms of concern.

## 2022-07-17 NOTE — ED Notes (Signed)
Attempted to ambulate pt, pt complained of continued dizziness and upon standing had trouble with balance. Pt was only able to take a few small steps before having to get back into bed due to balance

## 2022-07-17 NOTE — ED Provider Notes (Signed)
Care of patient assumed from Dr. Roxanne Mins.  This patient has had acute onset of near syncopal symptoms when standing.  Workup has been negative.  She is still unable to ambulate. Physical Exam  BP 128/83   Pulse 89   Temp (!) 97.1 F (36.2 C) (Temporal)   Resp (!) 21   SpO2 93%   Physical Exam Vitals and nursing note reviewed.  Constitutional:      General: She is not in acute distress.    Appearance: Normal appearance. She is well-developed. She is not toxic-appearing or diaphoretic.  HENT:     Head: Normocephalic and atraumatic.     Right Ear: External ear normal.     Left Ear: External ear normal.     Nose: Nose normal.     Mouth/Throat:     Mouth: Mucous membranes are moist.  Eyes:     Extraocular Movements: Extraocular movements intact.     Conjunctiva/sclera: Conjunctivae normal.  Cardiovascular:     Rate and Rhythm: Normal rate and regular rhythm.  Pulmonary:     Effort: Pulmonary effort is normal. No respiratory distress.  Abdominal:     General: There is no distension.     Palpations: Abdomen is soft.     Tenderness: There is no abdominal tenderness.  Musculoskeletal:        General: No swelling. Normal range of motion.     Cervical back: Normal range of motion and neck supple.     Right lower leg: No edema.     Left lower leg: No edema.  Skin:    General: Skin is warm and dry.     Capillary Refill: Capillary refill takes less than 2 seconds.     Coloration: Skin is not jaundiced or pale.  Neurological:     General: No focal deficit present.     Mental Status: She is alert and oriented to person, place, and time.     Cranial Nerves: No cranial nerve deficit.     Sensory: No sensory deficit.     Motor: Tremor present. No weakness.     Coordination: Coordination normal.  Psychiatric:        Mood and Affect: Mood normal.        Behavior: Behavior normal.        Thought Content: Thought content normal.        Judgment: Judgment normal.     Procedures   Procedures  ED Course / MDM    Medical Decision Making Amount and/or Complexity of Data Reviewed Labs: ordered. Radiology: ordered.  Risk Prescription drug management.   On assessment, patient resting on ED stretcher.  Her husband is at bedside.  She has a resting tremor which both state are baseline for her.  She is minimally symptomatic at rest.  When sitting or standing, she becomes lightheaded.  On my check of orthostatic vital signs, patient was 139/81 while sitting in semirecumbent position.  When stood up, blood pressure dropped to 117/43.  She states that she recently started on hydrochlorothiazide about 6 weeks ago.  Since she has started that, she has had polyuria.  She denies any other recent fluid losses.  On today's lab work, potassium is low.  Will give IV fluids and potassium replacement.  Will also check magnesium.  Following IV fluids, patient was stood up again.  She no longer has orthostatic hypotension.  She states that her symptoms are mildly improved but does continue to endorse positional dizziness that gets worse with  standing.  Dose of meclizine was ordered.  On further reassessment, patient had resolution of symptoms.  She was able to ambulate around the room, with a cane with no further lightheadedness or dizziness.  Ambulating with a cane is her baseline.  She was prescribed meclizine to take as needed.  She was advised to discontinue HCTZ until she talks to her primary care doctor.  She was discharged in good condition.       Godfrey Pick, MD 07/17/22 1149

## 2022-07-17 NOTE — ED Notes (Signed)
Attempted to collect orthostatic vitals signs on pt, pt began complaining of dizziness upon sitting up. After standing, pt stated the dizziness had worsened and fell back into the bed. Unable to get standing blood pressure at this time, blood pressure after sitting back down was 163/96

## 2022-07-19 ENCOUNTER — Ambulatory Visit (INDEPENDENT_AMBULATORY_CARE_PROVIDER_SITE_OTHER): Payer: Medicare HMO | Admitting: Family Medicine

## 2022-07-19 VITALS — BP 122/78 | HR 71 | Temp 98.3°F | Ht 70.0 in | Wt 252.6 lb

## 2022-07-19 DIAGNOSIS — E876 Hypokalemia: Secondary | ICD-10-CM

## 2022-07-19 DIAGNOSIS — I10 Essential (primary) hypertension: Secondary | ICD-10-CM | POA: Diagnosis not present

## 2022-07-19 DIAGNOSIS — R42 Dizziness and giddiness: Secondary | ICD-10-CM

## 2022-07-19 DIAGNOSIS — I951 Orthostatic hypotension: Secondary | ICD-10-CM | POA: Diagnosis not present

## 2022-07-19 DIAGNOSIS — R251 Tremor, unspecified: Secondary | ICD-10-CM | POA: Diagnosis not present

## 2022-07-19 DIAGNOSIS — Z0279 Encounter for issue of other medical certificate: Secondary | ICD-10-CM

## 2022-07-19 LAB — BASIC METABOLIC PANEL
BUN: 10 mg/dL (ref 6–23)
CO2: 28 mEq/L (ref 19–32)
Calcium: 9.1 mg/dL (ref 8.4–10.5)
Chloride: 105 mEq/L (ref 96–112)
Creatinine, Ser: 0.78 mg/dL (ref 0.40–1.20)
GFR: 77.55 mL/min (ref >60.00)
Glucose, Bld: 89 mg/dL (ref 70–99)
Potassium: 4.1 mEq/L (ref 3.5–5.1)
Sodium: 140 mEq/L (ref 135–145)

## 2022-07-19 MED ORDER — LORAZEPAM 0.5 MG PO TABS: 0.5000 mg | ORAL_TABLET | Freq: Two times a day (BID) | ORAL | 1 refills | Status: DC | PRN

## 2022-07-19 NOTE — Progress Notes (Signed)
Please call patient: I have reviewed his/her lab results. Blood tests today are normal. Potassium is better.

## 2022-07-19 NOTE — Progress Notes (Signed)
Subjective  CC:  Chief Complaint  Patient presents with   Hospitalization Follow-up    Hypokalemia    HPI: Brooke Coleman is a 72 y.o. female who presents to the office today to address the problems listed above in the chief complaint. Reviewed recent emergency room evaluation for lightheadedness, dizziness and anxiety.  ER eval showed orthostatic hypotension, dizziness relieved with IV fluids and meclizine, slightly relieved with Ativan and oral potassium supplementation for hypokalemia.  Lab work was otherwise unremarkable as noted below.  Brain MRI was done to rule out posterior CVA, this was negative.  Since returning home, she has done better.  No longer feeling dizzy.  Has not needed more meclizine.  She stopped the hydrochlorothiazide 12.5 mg daily.  Has not checked her blood pressures at home.  No lightheadedness upon standing.  She does have a very significant resting tremor that continues to worsen.  She did report that the Ativan helped slow the tremor.  No visits with results within 1 Day(s) from this visit.  Latest known visit with results is:  Admission on 07/16/2022, Discharged on 07/17/2022  Component Date Value Ref Range Status   Sodium 07/16/2022 137  135 - 145 mmol/L Final   Potassium 07/16/2022 3.3 (L)  3.5 - 5.1 mmol/L Final   Chloride 07/16/2022 103  98 - 111 mmol/L Final   CO2 07/16/2022 24  22 - 32 mmol/L Final   Glucose, Bld 07/16/2022 104 (H)  70 - 99 mg/dL Final   BUN 07/16/2022 13  8 - 23 mg/dL Final   Creatinine, Ser 07/16/2022 1.08 (H)  0.44 - 1.00 mg/dL Final   Calcium 07/16/2022 8.7 (L)  8.9 - 10.3 mg/dL Final   Total Protein 07/16/2022 7.0  6.5 - 8.1 g/dL Final   Albumin 07/16/2022 3.7  3.5 - 5.0 g/dL Final   AST 07/16/2022 19  15 - 41 U/L Final   ALT 07/16/2022 14  0 - 44 U/L Final   Alkaline Phosphatase 07/16/2022 96  38 - 126 U/L Final   Total Bilirubin 07/16/2022 0.6  0.3 - 1.2 mg/dL Final   GFR, Estimated 07/16/2022 56 (L)  >60 mL/min Final    Anion gap 07/16/2022 10  5 - 15 Final   Troponin I (High Sensitivity) 07/16/2022 4  <18 ng/L Final   WBC 07/16/2022 6.8  4.0 - 10.5 K/uL Final   RBC 07/16/2022 4.29  3.87 - 5.11 MIL/uL Final   Hemoglobin 07/16/2022 13.6  12.0 - 15.0 g/dL Final   HCT 07/16/2022 41.0  36.0 - 46.0 % Final   MCV 07/16/2022 95.6  80.0 - 100.0 fL Final   MCH 07/16/2022 31.7  26.0 - 34.0 pg Final   MCHC 07/16/2022 33.2  30.0 - 36.0 g/dL Final   RDW 07/16/2022 12.7  11.5 - 15.5 % Final   Platelets 07/16/2022 271  150 - 400 K/uL Final   nRBC 07/16/2022 0.0  0.0 - 0.2 % Final   Neutrophils Relative % 07/16/2022 54  % Final   Neutro Abs 07/16/2022 3.7  1.7 - 7.7 K/uL Final   Lymphocytes Relative 07/16/2022 30  % Final   Lymphs Abs 07/16/2022 2.0  0.7 - 4.0 K/uL Final   Monocytes Relative 07/16/2022 9  % Final   Monocytes Absolute 07/16/2022 0.6  0.1 - 1.0 K/uL Final   Eosinophils Relative 07/16/2022 6  % Final   Eosinophils Absolute 07/16/2022 0.4  0.0 - 0.5 K/uL Final   Basophils Relative 07/16/2022 1  % Final  Basophils Absolute 07/16/2022 0.1  0.0 - 0.1 K/uL Final   Immature Granulocytes 07/16/2022 0  % Final   Abs Immature Granulocytes 07/16/2022 0.01  0.00 - 0.07 K/uL Final   Color, Urine 07/17/2022 YELLOW  YELLOW Final   APPearance 07/17/2022 CLEAR  CLEAR Final   Specific Gravity, Urine 07/17/2022 1.019  1.005 - 1.030 Final   pH 07/17/2022 5.0  5.0 - 8.0 Final   Glucose, UA 07/17/2022 NEGATIVE  NEGATIVE mg/dL Final   Hgb urine dipstick 07/17/2022 NEGATIVE  NEGATIVE Final   Bilirubin Urine 07/17/2022 NEGATIVE  NEGATIVE Final   Ketones, ur 07/17/2022 NEGATIVE  NEGATIVE mg/dL Final   Protein, ur 07/17/2022 NEGATIVE  NEGATIVE mg/dL Final   Nitrite 07/17/2022 NEGATIVE  NEGATIVE Final   Leukocytes,Ua 07/17/2022 TRACE (A)  NEGATIVE Final   RBC / HPF 07/17/2022 0-5  0 - 5 RBC/hpf Final   WBC, UA 07/17/2022 0-5  0 - 5 WBC/hpf Final   Bacteria, UA 07/17/2022 RARE (A)  NONE SEEN Final   Squamous Epithelial /  HPF 07/17/2022 0-5  0 - 5 /HPF Final   Troponin I (High Sensitivity) 07/17/2022 6  <18 ng/L Final   Magnesium 07/17/2022 2.1  1.7 - 2.4 mg/dL Final    Assessment  1. Essential hypertension   2. Tremor   3. Dizziness   4. Orthostatic hypotension   5. Hypokalemia      Plan  Hypertension: Had a significant orthostatic hypotension on hydrochlorothiazide with hypokalemia.  Will recheck since stopping.  Blood pressure today looks good and she does not have symptoms of lightheadedness.  Continue amlodipine and beta-blocker. Dizziness: Likely multifactorial.  Orthostatic hypotension in emergency room no longer symptomatic.  Possibly positional vertigo playing a role.  Will monitor. Due to diuretic.  Check potassium levels Tremor: Now ready to see neurology again.  Last visit with Dr. Jannifer Franklin was in 2 years ago.  Needs further evaluation for Parkinson's.  Brain MRI was unremarkable.  Patient is anxious, Ativan to be used as needed for anxiety and tremor in the meantime.  Fall precaution discussed  I spent a total of 46 minutes for this patient encounter. Time spent included preparation, face-to-face counseling with the patient and coordination of care, review of chart and records, and documentation of the encounter.  Follow up: As scheduled for complete physical 08/02/2022  Orders Placed This Encounter  Procedures   Basic metabolic panel   Ambulatory referral to Neurology   Meds ordered this encounter  Medications   LORazepam (ATIVAN) 0.5 MG tablet    Sig: Take 1 tablet (0.5 mg total) by mouth 2 (two) times daily as needed for anxiety.    Dispense:  30 tablet    Refill:  1      I reviewed the patients updated PMH, FH, and SocHx.    Patient Active Problem List   Diagnosis Date Noted   Chronic neck pain 01/14/2020    Priority: High   Chronic insomnia 01/24/2017    Priority: High   Essential hypertension 11/07/2016    Priority: High   Dysthymia 11/07/2016    Priority: High    Tremor 07/27/2015    Priority: High   Cervical spondylosis without myelopathy 06/04/2015    Priority: High   Primary osteoarthritis of both hands 01/14/2020    Priority: Medium    Primary osteoarthritis of both wrists 01/14/2020    Priority: Medium    Mild intermittent asthma 01/14/2020    Priority: Medium    Gastroesophageal reflux disease without  esophagitis 11/07/2016    Priority: Medium    Bilateral carpal tunnel syndrome 06/04/2015    Priority: Medium    Vitamin D deficiency 01/14/2020    Priority: Low   Vitamin B12 deficiency 01/14/2020    Priority: Low   Drusen of macula, bilateral 01/03/2018    Priority: Low   History of anaphylaxis 06/08/2021   Current Meds  Medication Sig   albuterol (VENTOLIN HFA) 108 (90 Base) MCG/ACT inhaler Inhale 2 puffs into the lungs every 6 (six) hours as needed for wheezing or shortness of breath.   amitriptyline (ELAVIL) 25 MG tablet Take 1 tablet (25 mg total) by mouth at bedtime.   amLODipine (NORVASC) 10 MG tablet Take 1 tablet (10 mg total) by mouth daily.   aspirin 81 MG tablet Take 81 mg by mouth daily.   EPINEPHrine 0.3 mg/0.3 mL IJ SOAJ injection Inject 0.3 mg into the muscle as needed for anaphylaxis.   levocetirizine (XYZAL ALLERGY 24HR) 5 MG tablet Take 1 tablet (5 mg total) by mouth every evening.   LORazepam (ATIVAN) 0.5 MG tablet Take 1 tablet (0.5 mg total) by mouth 2 (two) times daily as needed for anxiety.   meclizine (ANTIVERT) 12.5 MG tablet Take 1 tablet (12.5 mg total) by mouth 3 (three) times daily as needed for dizziness.   metoprolol succinate (TOPROL-XL) 50 MG 24 hr tablet Take 1 tablet (50 mg total) by mouth daily.   sertraline (ZOLOFT) 100 MG tablet Take 1 tablet (100 mg total) by mouth daily.   tobramycin (TOBREX) 0.3 % ophthalmic solution Place 2 drops into the left eye in the morning, at noon, and at bedtime.   traZODone (DESYREL) 50 MG tablet Take 1 tablet (50 mg total) by mouth at bedtime.     Allergies: Patient is allergic to cortisone, erythromycin, neurontin [gabapentin], eggs or egg-derived products, prednisone, flexeril [cyclobenzaprine], and naproxen. Family History: Patient family history includes Cancer - Lung in her brother; Heart disease in her brother and father; Hypertension in her father; Lung disease in her brother and sister; Pulmonary embolism in her mother; Syncope episode in her sister. Social History:  Patient  reports that she quit smoking about 17 years ago. Her smoking use included cigarettes. She has never used smokeless tobacco. She reports that she does not drink alcohol and does not use drugs.  Review of Systems: Constitutional: Negative for fever malaise or anorexia Cardiovascular: negative for chest pain Respiratory: negative for SOB or persistent cough Gastrointestinal: negative for abdominal pain  Objective  Vitals: BP 122/78   Pulse 71   Temp 98.3 F (36.8 C)   Ht '5\' 10"'$  (1.778 m)   Wt 252 lb 9.6 oz (114.6 kg)   SpO2 99%   BMI 36.24 kg/m  General: no acute distress , A&Ox3 Neuro: Moderate to severe resting tremor bilateral hands, head and voice. HEENT: PEERL, conjunctiva normal, neck is supple Cardiovascular:  RRR without murmur or gallop.  Respiratory:  Good breath sounds bilaterally, CTAB with normal respiratory effort Skin:  Warm, no rashes  Commons side effects, risks, benefits, and alternatives for medications and treatment plan prescribed today were discussed, and the patient expressed understanding of the given instructions. Patient is instructed to call or message via MyChart if he/she has any questions or concerns regarding our treatment plan. No barriers to understanding were identified. We discussed Red Flag symptoms and signs in detail. Patient expressed understanding regarding what to do in case of urgent or emergency type symptoms.  Medication list was reconciled,  printed and provided to the patient in AVS. Patient  instructions and summary information was reviewed with the patient as documented in the AVS. This note was prepared with assistance of Dragon voice recognition software. Occasional wrong-word or sound-a-like substitutions may have occurred due to the inherent limitations of voice recognition software

## 2022-07-19 NOTE — Patient Instructions (Signed)
Please follow up as scheduled for your next visit with me: 08/02/2022   If you have any questions or concerns, please don't hesitate to send me a message via MyChart or call the office at [phone]. Thank you for visiting with [place] today! It's our pleasure caring for you.   Please answer your phone; the neurology office will be calling you to get you scheduled for an appointment. This is important!  Stop the hctz. We will recheck your blood pressure at your next visit.  I am checking your potassium today.  I have ordered lorazepam: this is the nerve pill you got in the ER to calm your nerves. Take it as needed daily; monitor your anxiety and your tremors.

## 2022-07-20 ENCOUNTER — Encounter: Payer: Self-pay | Admitting: Family Medicine

## 2022-08-02 ENCOUNTER — Encounter: Payer: Medicare HMO | Admitting: Family Medicine

## 2022-08-07 ENCOUNTER — Ambulatory Visit: Payer: Medicare HMO

## 2022-08-08 NOTE — Progress Notes (Signed)
Pt was rescheduled for 08/15/22

## 2022-08-08 NOTE — Progress Notes (Deleted)
Pt was rescheduled for 08/15/22

## 2022-08-15 ENCOUNTER — Ambulatory Visit (INDEPENDENT_AMBULATORY_CARE_PROVIDER_SITE_OTHER): Payer: Medicare HMO

## 2022-08-15 VITALS — Wt 252.0 lb

## 2022-08-15 DIAGNOSIS — Z0000 Encounter for general adult medical examination without abnormal findings: Secondary | ICD-10-CM | POA: Diagnosis not present

## 2022-08-15 NOTE — Patient Instructions (Signed)
Brooke Coleman , Thank you for taking time to come for your Medicare Wellness Visit. I appreciate your ongoing commitment to your health goals. Please review the following plan we discussed and let me know if I can assist you in the future.   These are the goals we discussed:  Goals      Patient Stated     None at this time      Patient Stated     Patient Stated     Lose some weight         This is a list of the screening recommended for you and due dates:  Health Maintenance  Topic Date Due   DTaP/Tdap/Td vaccine (1 - Tdap) Never done   Mammogram  10/10/2021   COVID-19 Vaccine (4 - 2023-24 season) 02/10/2022   Medicare Annual Wellness Visit  08/15/2023   Colon Cancer Screening  11/11/2023   DEXA scan (bone density measurement)  04/27/2025   Pneumonia Vaccine  Completed   Hepatitis C Screening: USPSTF Recommendation to screen - Ages 48-79 yo.  Completed   HPV Vaccine  Aged Out   Flu Shot  Discontinued   Zoster (Shingles) Vaccine  Discontinued    Advanced directives: Advance directive discussed with you today. Even though you declined this today please call our office should you change your mind and we can give you the proper paperwork for you to fill out.  Conditions/risks identified: lose weight   Next appointment: Follow up in one year for your annual wellness visit    Preventive Care 65 Years and Older, Female Preventive care refers to lifestyle choices and visits with your health care provider that can promote health and wellness. What does preventive care include? A yearly physical exam. This is also called an annual well check. Dental exams once or twice a year. Routine eye exams. Ask your health care provider how often you should have your eyes checked. Personal lifestyle choices, including: Daily care of your teeth and gums. Regular physical activity. Eating a healthy diet. Avoiding tobacco and drug use. Limiting alcohol use. Practicing safe sex. Taking  low-dose aspirin every day. Taking vitamin and mineral supplements as recommended by your health care provider. What happens during an annual well check? The services and screenings done by your health care provider during your annual well check will depend on your age, overall health, lifestyle risk factors, and family history of disease. Counseling  Your health care provider may ask you questions about your: Alcohol use. Tobacco use. Drug use. Emotional well-being. Home and relationship well-being. Sexual activity. Eating habits. History of falls. Memory and ability to understand (cognition). Work and work Statistician. Reproductive health. Screening  You may have the following tests or measurements: Height, weight, and BMI. Blood pressure. Lipid and cholesterol levels. These may be checked every 5 years, or more frequently if you are over 59 years old. Skin check. Lung cancer screening. You may have this screening every year starting at age 60 if you have a 30-pack-year history of smoking and currently smoke or have quit within the past 15 years. Fecal occult blood test (FOBT) of the stool. You may have this test every year starting at age 33. Flexible sigmoidoscopy or colonoscopy. You may have a sigmoidoscopy every 5 years or a colonoscopy every 10 years starting at age 33. Hepatitis C blood test. Hepatitis B blood test. Sexually transmitted disease (STD) testing. Diabetes screening. This is done by checking your blood sugar (glucose) after you have not eaten  for a while (fasting). You may have this done every 1-3 years. Bone density scan. This is done to screen for osteoporosis. You may have this done starting at age 67. Mammogram. This may be done every 1-2 years. Talk to your health care provider about how often you should have regular mammograms. Talk with your health care provider about your test results, treatment options, and if necessary, the need for more tests. Vaccines   Your health care provider may recommend certain vaccines, such as: Influenza vaccine. This is recommended every year. Tetanus, diphtheria, and acellular pertussis (Tdap, Td) vaccine. You may need a Td booster every 10 years. Zoster vaccine. You may need this after age 59. Pneumococcal 13-valent conjugate (PCV13) vaccine. One dose is recommended after age 25. Pneumococcal polysaccharide (PPSV23) vaccine. One dose is recommended after age 17. Talk to your health care provider about which screenings and vaccines you need and how often you need them. This information is not intended to replace advice given to you by your health care provider. Make sure you discuss any questions you have with your health care provider. Document Released: 06/25/2015 Document Revised: 02/16/2016 Document Reviewed: 03/30/2015 Elsevier Interactive Patient Education  2017 [place] Prevention in the Home Falls can cause injuries. They can happen to people of all ages. There are many things you can do to make your home safe and to help prevent falls. What can I do on the outside of my home? Regularly fix the edges of walkways and driveways and fix any cracks. Remove anything that might make you trip as you walk through a door, such as a raised step or threshold. Trim any bushes or trees on the path to your home. Use bright outdoor lighting. Clear any walking paths of anything that might make someone trip, such as rocks or tools. Regularly check to see if handrails are loose or broken. Make sure that both sides of any steps have handrails. Any raised decks and porches should have guardrails on the edges. Have any leaves, snow, or ice cleared regularly. Use sand or salt on walking paths during winter. Clean up any spills in your garage right away. This includes oil or grease spills. What can I do in the bathroom? Use night lights. Install grab bars by the toilet and in the tub and shower. Do not use towel  bars as grab bars. Use non-skid mats or decals in the tub or shower. If you need to sit down in the shower, use a plastic, non-slip stool. Keep the floor dry. Clean up any water that spills on the floor as soon as it happens. Remove soap buildup in the tub or shower regularly. Attach bath mats securely with double-sided non-slip rug tape. Do not have throw rugs and other things on the floor that can make you trip. What can I do in the bedroom? Use night lights. Make sure that you have a light by your bed that is easy to reach. Do not use any sheets or blankets that are too big for your bed. They should not hang down onto the floor. Have a firm chair that has side arms. You can use this for support while you get dressed. Do not have throw rugs and other things on the floor that can make you trip. What can I do in the kitchen? Clean up any spills right away. Avoid walking on wet floors. Keep items that you use a lot in easy-to-reach places. If you need to reach something  above you, use a strong step stool that has a grab bar. Keep electrical cords out of the way. Do not use floor polish or wax that makes floors slippery. If you must use wax, use non-skid floor wax. Do not have throw rugs and other things on the floor that can make you trip. What can I do with my stairs? Do not leave any items on the stairs. Make sure that there are handrails on both sides of the stairs and use them. Fix handrails that are broken or loose. Make sure that handrails are as long as the stairways. Check any carpeting to make sure that it is firmly attached to the stairs. Fix any carpet that is loose or worn. Avoid having throw rugs at the top or bottom of the stairs. If you do have throw rugs, attach them to the floor with carpet tape. Make sure that you have a light switch at the top of the stairs and the bottom of the stairs. If you do not have them, ask someone to add them for you. What else can I do to help  prevent falls? Wear shoes that: Do not have high heels. Have rubber bottoms. Are comfortable and fit you well. Are closed at the toe. Do not wear sandals. If you use a stepladder: Make sure that it is fully opened. Do not climb a closed stepladder. Make sure that both sides of the stepladder are locked into place. Ask someone to hold it for you, if possible. Clearly mark and make sure that you can see: Any grab bars or handrails. First and last steps. Where the edge of each step is. Use tools that help you move around (mobility aids) if they are needed. These include: Canes. Walkers. Scooters. Crutches. Turn on the lights when you go into a dark area. Replace any light bulbs as soon as they burn out. Set up your furniture so you have a clear path. Avoid moving your furniture around. If any of your floors are uneven, fix them. If there are any pets around you, be aware of where they are. Review your medicines with your doctor. Some medicines can make you feel dizzy. This can increase your chance of falling. Ask your doctor what other things that you can do to help prevent falls. This information is not intended to replace advice given to you by your health care provider. Make sure you discuss any questions you have with your health care provider. Document Released: 03/25/2009 Document Revised: 11/04/2015 Document Reviewed: 07/03/2014 Elsevier Interactive Patient Education  2017 [employer].

## 2022-08-15 NOTE — Progress Notes (Signed)
Subjective:   Brooke Coleman is a 72 y.o. female who presents for Medicare Annual (Subsequent) preventive examination.  Review of Systems     Cardiac Risk Factors include: advanced age (>52mn, >>19women);obesity (BMI >30kg/m2);hypertension     Objective:    Today's Vitals   08/15/22 1136  Weight: 252 lb (114.3 kg)   Body mass index is 36.16 kg/m.     08/15/2022   11:45 AM 07/17/2022   12:47 AM 05/10/2022   10:28 AM 07/29/2021    1:55 PM 04/02/2019    1:39 PM 07/27/2015   11:15 AM  Advanced Directives  Does Patient Have a Medical Advance Directive? No No No No No No  Would patient like information on creating a medical advance directive? No - Patient declined No - Patient declined No - Patient declined Yes (MAU/Ambulatory/Procedural Areas - Information given) Yes (MAU/Ambulatory/Procedural Areas - Information given)     Current Medications (verified) Outpatient Encounter Medications as of 08/15/2022  Medication Sig   albuterol (VENTOLIN HFA) 108 (90 Base) MCG/ACT inhaler Inhale 2 puffs into the lungs every 6 (six) hours as needed for wheezing or shortness of breath.   amitriptyline (ELAVIL) 25 MG tablet Take 1 tablet (25 mg total) by mouth at bedtime.   amLODipine (NORVASC) 10 MG tablet Take 1 tablet (10 mg total) by mouth daily.   aspirin 81 MG tablet Take 81 mg by mouth daily.   EPINEPHrine 0.3 mg/0.3 mL IJ SOAJ injection Inject 0.3 mg into the muscle as needed for anaphylaxis.   levocetirizine (XYZAL ALLERGY 24HR) 5 MG tablet Take 1 tablet (5 mg total) by mouth every evening.   LORazepam (ATIVAN) 0.5 MG tablet Take 1 tablet (0.5 mg total) by mouth 2 (two) times daily as needed for anxiety.   meclizine (ANTIVERT) 12.5 MG tablet Take 1 tablet (12.5 mg total) by mouth 3 (three) times daily as needed for dizziness.   metoprolol succinate (TOPROL-XL) 50 MG 24 hr tablet Take 1 tablet (50 mg total) by mouth daily.   sertraline (ZOLOFT) 100 MG tablet Take 1 tablet (100 mg total) by  mouth daily.   tobramycin (TOBREX) 0.3 % ophthalmic solution Place 2 drops into the left eye in the morning, at noon, and at bedtime.   traZODone (DESYREL) 50 MG tablet Take 1 tablet (50 mg total) by mouth at bedtime.   No facility-administered encounter medications on file as of 08/15/2022.    Allergies (verified) Cortisone, Erythromycin, Neurontin [gabapentin], Eggs or egg-derived products, Prednisone, Flexeril [cyclobenzaprine], and Naproxen   History: Past Medical History:  Diagnosis Date   Cervical spondylosis    Chronic neck pain 01/14/2020   Evaluated by ortho and NS: offered surgery but not clearly going to be helpful. Pt defers.    Chronic pain    Depression    Heart murmur    History of posterior vitreous detachment 01/03/2018   Hyperlipidemia    Hypertension    Mild intermittent asthma 01/14/2020   Tremors of nervous system    History reviewed. No pertinent surgical history. Family History  Problem Relation Age of Onset   Pulmonary embolism Mother    Hypertension Father    Heart disease Father    Lung disease Sister    Syncope episode Sister    Lung disease Brother    Heart disease Brother    Cancer - Lung Brother    Social History   Socioeconomic History   Marital status: Legally Separated    Spouse name: Not on file  Number of children: 2   Years of education: AAS   Highest education level: Not on file  Occupational History   Occupation: retired  Tobacco Use   Smoking status: Former    Types: Cigarettes    Quit date: 06/12/2005    Years since quitting: 17.1   Smokeless tobacco: Never  Substance and Sexual Activity   Alcohol use: No   Drug use: No   Sexual activity: Not on file  Other Topics Concern   Not on file  Social History Narrative   Lives at home w/ her daughter   Patient drinks 1 cup of coffee daily.   Patient is left handed.    Social Determinants of Health   Financial Resource Strain: Low Risk  (08/15/2022)   Overall Financial Resource  Strain (CARDIA)    Difficulty of Paying Living Expenses: Not hard at all  Food Insecurity: No Food Insecurity (08/15/2022)   Hunger Vital Sign    Worried About Running Out of Food in the Last Year: Never true    Ran Out of Food in the Last Year: Never true  Transportation Needs: No Transportation Needs (08/15/2022)   PRAPARE - Hydrologist (Medical): No    Lack of Transportation (Non-Medical): No  Physical Activity: Insufficiently Active (08/15/2022)   Exercise Vital Sign    Days of Exercise per Week: 3 days    Minutes of Exercise per Session: 30 min  Stress: No Stress Concern Present (08/15/2022)   [place]    Feeling of Stress : Not at all  Social Connections: Moderately Isolated (08/15/2022)   Social Connection and Isolation Panel [NHANES]    Frequency of Communication with Friends and Family: More than three times a week    Frequency of Social Gatherings with Friends and Family: More than three times a week    Attends Religious Services: 1 to 4 times per year    Active Member of [employer] or Organizations: No    Attends Archivist Meetings: Never    Marital Status: Separated    Tobacco Counseling Counseling given: Not Answered   Clinical Intake:  Pre-visit preparation completed: Yes  Pain : No/denies pain     BMI - recorded: 36.16 Nutritional Status: BMI > 30  Obese Nutritional Risks: None Diabetes: No  How often do you need to have someone help you when you read instructions, pamphlets, or other written materials from your doctor or pharmacy?: 1 - Never  Diabetic?no  Interpreter Needed?: No  Information entered by :: Charlott Rakes, LPN   Activities of Daily Living    08/15/2022   11:45 AM  In your present state of health, do you have any difficulty performing the following activities:  Hearing? 0  Vision? 0  Difficulty concentrating or making decisions? 0   Walking or climbing stairs? 0  Comment don't use stairs  Dressing or bathing? 0  Doing errands, shopping? 0  Preparing Food and eating ? N  Using the Toilet? N  In the past six months, have you accidently leaked urine? N  Do you have problems with loss of bowel control? N  Managing your Medications? N  Managing your Finances? N  Housekeeping or managing your Housekeeping? N    Patient Care Team: Leamon Arnt, MD as PCP - General (Family Medicine) Marybelle Killings, MD as Consulting Physician (Orthopedic Surgery) Ashok Pall, MD as Consulting Physician (Neurosurgery) Hortencia Pilar, MD as  Consulting Physician (Surgery) Kathrynn Ducking, MD (Inactive) as Consulting Physician (Neurology)  Indicate any recent Medical Services you may have received from other than [facility] providers in the past year (date may be approximate).     Assessment:   This is a routine wellness examination for [facility].  Hearing/Vision screen Hearing Screening - Comments:: Pt denies any hearing issues  Vision Screening - Comments:: Pt follows up with Dr Herbert Deaner for annual eye exams   Dietary issues and exercise activities discussed: Current Exercise Habits: Home exercise routine, Type of exercise: walking, Time (Minutes): 30, Frequency (Times/Week): 3, Weekly Exercise (Minutes/Week): 90   Goals Addressed             This Visit's Progress    Patient Stated       Patient Stated       Lose some weight        Depression Screen    08/15/2022   11:40 AM 07/19/2022   10:09 AM 03/22/2022   10:43 AM 12/07/2021    3:20 PM 07/29/2021    1:54 PM 06/08/2021    1:54 PM 08/25/2020    1:21 PM  PHQ 2/9 Scores  PHQ - 2 Score 4 4 0 0 0 0 3  PHQ- 9 Score '16 16    8 6    '$ Fall Risk    08/15/2022   11:45 AM 07/19/2022   10:09 AM 03/22/2022   10:43 AM 07/29/2021    1:56 PM 07/28/2020    1:55 PM  Fall Risk   Falls in the past year? 0 0 '1 1 1  '$ Number falls in past yr: 0 0 0 1 1  Injury with Fall? 0 0 0 1 1   Comment    back injury   Risk for fall due to : Impaired vision;Impaired balance/gait;Impaired mobility No Fall Risks No Fall Risks Impaired vision Impaired balance/gait  Follow up Falls prevention discussed Falls evaluation completed  Falls prevention discussed Education provided    [place]:  Any stairs in or around the home? Yes  If so, are there any without handrails? No  Home free of loose throw rugs in walkways, pet beds, electrical cords, etc? Yes  Adequate lighting in your home to reduce risk of falls? Yes   ASSISTIVE DEVICES UTILIZED TO PREVENT FALLS:  Life alert? No  Use of a cane, walker or w/c? Yes  Grab bars in the bathroom? Yes  Shower chair or bench in shower? Yes  Elevated toilet seat or a handicapped toilet? No   TIMED UP AND GO:  Was the test performed? No .   Cognitive Function:        08/15/2022   11:46 AM 07/29/2021    2:29 PM 04/02/2019    1:40 PM  6CIT Screen  What Year? 0 points 0 points 0 points  What month? 0 points 0 points 0 points  What time? 0 points 0 points 0 points  Count back from 20 0 points 0 points 0 points  Months in reverse 0 points 0 points 0 points  Repeat phrase 0 points 4 points 2 points  Total Score 0 points 4 points 2 points    Immunizations Immunization History  Administered Date(s) Administered   PFIZER Comirnaty(Gray Top)Covid-19 Tri-Sucrose Vaccine 08/18/2020   PFIZER(Purple Top)SARS-COV-2 Vaccination 10/15/2019, 11/05/2019   PNEUMOCOCCAL CONJUGATE-20 06/08/2021    TDAP status: Due, Education has been provided regarding the importance of this vaccine. Advised may receive this vaccine  at local pharmacy or Health Dept. Aware to provide a copy of the vaccination record if obtained from local pharmacy or Health Dept. Verbalized acceptance and understanding.  Flu Vaccine status: Declined, Education has been provided regarding the importance of this vaccine but patient still declined.  Advised may receive this vaccine at local pharmacy or Health Dept. Aware to provide a copy of the vaccination record if obtained from local pharmacy or Health Dept. Verbalized acceptance and understanding.  Pneumococcal vaccine status: Up to date  Covid-19 vaccine status: Completed vaccines  Qualifies for Shingles Vaccine? Yes   Zostavax completed No   Shingrix Completed?: No.    Education has been provided regarding the importance of this vaccine. Patient has been advised to call insurance company to determine out of pocket expense if they have not yet received this vaccine. Advised may also receive vaccine at local pharmacy or Health Dept. Verbalized acceptance and understanding.  Screening Tests Health Maintenance  Topic Date Due   DTaP/Tdap/Td (1 - Tdap) Never done   MAMMOGRAM  10/10/2021   COVID-19 Vaccine (4 - 2023-24 season) 02/10/2022   Medicare Annual Wellness (AWV)  08/15/2023   COLONOSCOPY (Pts 45-56yr Insurance coverage will need to be confirmed)  11/11/2023   DEXA SCAN  04/27/2025   Pneumonia Vaccine 70 Years old  Completed   Hepatitis C Screening  Completed   HPV VACCINES  Aged Out   INFLUENZA VACCINE  Discontinued   Zoster Vaccines- Shingrix  Discontinued    Health Maintenance  Health Maintenance Due  Topic Date Due   DTaP/Tdap/Td (1 - Tdap) Never done   MAMMOGRAM  10/10/2021   COVID-19 Vaccine (4 - 2023-24 season) 02/10/2022    Colorectal cancer screening: Type of screening: Colonoscopy. Completed 11/10/13. Repeat every 10 years  Mammogram status: Completed 10/10/20. Repeat every year  Bone Density status: Completed 04/27/20. Results reflect: Bone density results: NORMAL. Repeat every 5 years.   Additional Screening:  Hepatitis C Screening:  Completed 06/14/21  Vision Screening: Recommended annual ophthalmology exams for early detection of glaucoma and other disorders of the eye. Is the patient up to date with their annual eye exam?  Yes  Who is the  provider or what is the name of the office in which the patient attends annual eye exams? [facility]  If pt is not established with a provider, would they like to be referred to a provider to establish care? No .   Dental Screening: Recommended annual dental exams for proper oral hygiene  Community Resource Referral / Chronic Care Management: CRR required this visit?  No   CCM required this visit?  No      Plan:     I have personally reviewed and noted the following in the patient's chart:   Medical and social history Use of alcohol, tobacco or illicit drugs  Current medications and supplements including opioid prescriptions. Patient is not currently taking opioid prescriptions. Functional ability and status Nutritional status Physical activity Advanced directives List of other physicians Hospitalizations, surgeries, and ER visits in previous 12 months Vitals Screenings to include cognitive, depression, and falls Referrals and appointments  In addition, I have reviewed and discussed with patient certain preventive protocols, quality metrics, and best practice recommendations. A written personalized care plan for preventive services as well as general preventive health recommendations were provided to patient.     TWillette Brace LPN   [id number]  Nurse Notes: Pt stated she will discuss leg pain when she comes in for  08/23/22 appt

## 2022-08-23 ENCOUNTER — Ambulatory Visit (INDEPENDENT_AMBULATORY_CARE_PROVIDER_SITE_OTHER): Payer: Medicare HMO | Admitting: Family Medicine

## 2022-08-23 VITALS — BP 128/78 | HR 83 | Temp 98.6°F | Ht 70.0 in | Wt 252.4 lb

## 2022-08-23 DIAGNOSIS — R251 Tremor, unspecified: Secondary | ICD-10-CM | POA: Diagnosis not present

## 2022-08-23 DIAGNOSIS — F341 Dysthymic disorder: Secondary | ICD-10-CM

## 2022-08-23 DIAGNOSIS — I10 Essential (primary) hypertension: Secondary | ICD-10-CM

## 2022-08-23 DIAGNOSIS — Z0000 Encounter for general adult medical examination without abnormal findings: Secondary | ICD-10-CM | POA: Diagnosis not present

## 2022-08-23 DIAGNOSIS — E559 Vitamin D deficiency, unspecified: Secondary | ICD-10-CM

## 2022-08-23 DIAGNOSIS — M5431 Sciatica, right side: Secondary | ICD-10-CM

## 2022-08-23 DIAGNOSIS — E538 Deficiency of other specified B group vitamins: Secondary | ICD-10-CM

## 2022-08-23 DIAGNOSIS — F5104 Psychophysiologic insomnia: Secondary | ICD-10-CM | POA: Diagnosis not present

## 2022-08-23 LAB — LIPID PANEL
Cholesterol: 237 mg/dL — ABNORMAL HIGH (ref 0–200)
HDL: 47.7 mg/dL (ref >39.00)
LDL Cholesterol: 156 mg/dL — ABNORMAL HIGH (ref 0–99)
NonHDL: 188.92
Total CHOL/HDL Ratio: 5
Triglycerides: 164 mg/dL — ABNORMAL HIGH (ref 0.0–149.0)
VLDL: 32.8 mg/dL (ref 0.0–40.0)

## 2022-08-23 LAB — CBC WITH DIFFERENTIAL/PLATELET
Basophils Absolute: 0 10*3/uL (ref 0.0–0.1)
Basophils Relative: 0.8 % (ref 0.0–3.0)
Eosinophils Absolute: 0.3 10*3/uL (ref 0.0–0.7)
Eosinophils Relative: 5.7 % — ABNORMAL HIGH (ref 0.0–5.0)
HCT: 43.1 % (ref 36.0–46.0)
Hemoglobin: 14.7 g/dL (ref 12.0–15.0)
Lymphocytes Relative: 32.7 % (ref 12.0–46.0)
Lymphs Abs: 1.8 10*3/uL (ref 0.7–4.0)
MCHC: 34 g/dL (ref 30.0–36.0)
MCV: 92.2 fl (ref 78.0–100.0)
Monocytes Absolute: 0.4 10*3/uL (ref 0.1–1.0)
Monocytes Relative: 7 % (ref 3.0–12.0)
Neutro Abs: 3 10*3/uL (ref 1.4–7.7)
Neutrophils Relative %: 53.8 % (ref 43.0–77.0)
Platelets: 273 10*3/uL (ref 150.0–400.0)
RBC: 4.67 Mil/uL (ref 3.87–5.11)
RDW: 13.7 % (ref 11.5–15.5)
WBC: 5.6 10*3/uL (ref 4.0–10.5)

## 2022-08-23 LAB — COMPREHENSIVE METABOLIC PANEL
ALT: 8 U/L (ref 0–35)
AST: 13 U/L (ref 0–37)
Albumin: 4.2 g/dL (ref 3.5–5.2)
Alkaline Phosphatase: 93 U/L (ref 39–117)
BUN: 10 mg/dL (ref 6–23)
CO2: 28 mEq/L (ref 19–32)
Calcium: 9.6 mg/dL (ref 8.4–10.5)
Chloride: 104 mEq/L (ref 96–112)
Creatinine, Ser: 0.77 mg/dL (ref 0.40–1.20)
GFR: 78.71 mL/min (ref >60.00)
Glucose, Bld: 89 mg/dL (ref 70–99)
Potassium: 4.1 mEq/L (ref 3.5–5.1)
Sodium: 140 mEq/L (ref 135–145)
Total Bilirubin: 0.7 mg/dL (ref 0.2–1.2)
Total Protein: 7.6 g/dL (ref 6.0–8.3)

## 2022-08-23 LAB — TSH: TSH: 2.29 u[IU]/mL (ref 0.35–5.50)

## 2022-08-23 LAB — VITAMIN B12: Vitamin B-12: >1500 pg/mL — ABNORMAL HIGH (ref 211–911)

## 2022-08-23 LAB — VITAMIN D 25 HYDROXY (VIT D DEFICIENCY, FRACTURES): VITD: 26.23 ng/mL — ABNORMAL LOW (ref 30.00–100.00)

## 2022-08-23 MED ORDER — IBUPROFEN 800 MG PO TABS: 800.0000 mg | ORAL_TABLET | Freq: Three times a day (TID) | ORAL | 0 refills | Status: DC | PRN

## 2022-08-23 NOTE — Patient Instructions (Addendum)
Please return in 6 months for recheck.   Let me know if you are not improving with the ibuprofen.   If you have any questions or concerns, please don't hesitate to send me a message via MyChart or call the office at [phone]. Thank you for visiting with [place] today! It's our pleasure caring for you.   Keep an eye on your blood pressure at home.   See the neurologist in April.  Schedule your mammogram please.

## 2022-08-23 NOTE — Progress Notes (Unsigned)
Subjective  Chief Complaint  Patient presents with   Annual Exam    Pt here for Annual Exam and is currently fasting     HPI: Brooke Coleman is a 72 y.o. female who presents to [facility] today for a Female Wellness Visit. She also has the concerns and/or needs as listed above in the chief complaint. These will be addressed in addition to the Health Maintenance Visit.   Wellness Visit: annual visit with health maintenance review and exam {With-without:32421} Pap  *** Chronic disease f/u and/or acute problem visit: (deemed necessary to be done in addition to the wellness visit): ***  Assessment  1. Annual physical exam   2. Tremor   3. Essential hypertension   4. Dysthymia   5. Chronic insomnia   6. Vitamin D deficiency   7. Vitamin B12 deficiency      Plan  Female Wellness Visit: Age appropriate Health Maintenance and Prevention measures were discussed with patient. Included topics are cancer screening recommendations, ways to keep healthy (see AVS) including dietary and exercise recommendations, regular eye and dental care, use of seat belts, and avoidance of moderate alcohol use and tobacco use.  BMI: discussed patient's BMI and encouraged positive lifestyle modifications to help get to or maintain a target BMI. HM needs and immunizations were addressed and ordered. See below for orders. See HM and immunization section for updates. Routine labs and screening tests ordered including cmp, cbc and lipids where appropriate. Discussed recommendations regarding Vit D and calcium supplementation (see AVS)  Chronic disease management visit and/or acute problem visit: *** Follow up: No follow-ups on file.  Orders Placed This Encounter  Procedures   CBC with Differential/Platelet   Comprehensive metabolic panel   Lipid panel   TSH   VITAMIN D 25 Hydroxy (Vit-D Deficiency, Fractures)   Vitamin B12   No orders of the defined types were placed in this  encounter.     Body mass index is 36.22 kg/m. Wt Readings from Last 3 Encounters:  08/23/22 252 lb 6.4 oz (114.5 kg)  08/15/22 252 lb (114.3 kg)  07/19/22 252 lb 9.6 oz (114.6 kg)     Patient Active Problem List   Diagnosis Date Noted   Chronic neck pain 01/14/2020    Priority: High    Evaluated by ortho and NS: offered surgery but not clearly going to be helpful. Pt defers. Dr. Arneta Cliche    Chronic insomnia 01/24/2017    Priority: High   Essential hypertension 11/07/2016    Priority: High   Dysthymia 11/07/2016    Priority: High   Tremor 07/27/2015    Priority: High    Essential vs parkinsons's: eval 2021 neuro Referred for reevaluation; worsening, 05/2021, [facility]    Cervical spondylosis without myelopathy 06/04/2015    Priority: High   Primary osteoarthritis of both hands 01/14/2020    Priority: Medium    Primary osteoarthritis of both wrists 01/14/2020    Priority: Medium    Mild intermittent asthma 01/14/2020    Priority: Medium    Gastroesophageal reflux disease without esophagitis 11/07/2016    Priority: Medium    Bilateral carpal tunnel syndrome 06/04/2015    Priority: Medium    Vitamin D deficiency 01/14/2020    Priority: Low   Vitamin B12 deficiency 01/14/2020    Priority: Low   Drusen of macula, bilateral 01/03/2018    Priority: Low   History of anaphylaxis 06/08/2021    Medications: has epi pen  Health Maintenance  Topic Date Due   DTaP/Tdap/Td (1 - Tdap) Never done   MAMMOGRAM  10/10/2021   COVID-19 Vaccine (4 - 2023-24 season) 09/08/2022 (Originally 02/10/2022)   Medicare Annual Wellness (AWV)  08/15/2023   COLONOSCOPY (Pts 45-76yr Insurance coverage will need to be confirmed)  11/11/2023   DEXA SCAN  04/27/2025   Pneumonia Vaccine 72 Years old  Completed   Hepatitis C Screening  Completed   HPV VACCINES  Aged Out   INFLUENZA VACCINE  Discontinued   Zoster Vaccines- Shingrix  Discontinued   Immunization History  Administered Date(s)  Administered   PFIZER Comirnaty(Gray Top)Covid-19 Tri-Sucrose Vaccine 08/18/2020   PFIZER(Purple Top)SARS-COV-2 Vaccination 10/15/2019, 11/05/2019   PNEUMOCOCCAL CONJUGATE-20 06/08/2021   We updated and reviewed the patient's past history in detail and it is documented below. Allergies: Patient is allergic to cortisone, erythromycin, neurontin [gabapentin], egg-derived products, prednisone, flexeril [cyclobenzaprine], and naproxen. Past Medical History Patient  has a past medical history of Cervical spondylosis, Chronic neck pain (01/14/2020), Chronic pain, Depression, Heart murmur, History of posterior vitreous detachment (01/03/2018), Hyperlipidemia, Hypertension, Mild intermittent asthma (01/14/2020), and Tremors of nervous system. Past Surgical History Patient  has no past surgical history on file. Family History: Patient family history includes Cancer - Lung in her brother; Heart disease in her brother and father; Hypertension in her father; Lung disease in her brother and sister; Pulmonary embolism in her mother; Syncope episode in her sister. Social History:  Patient  reports that she quit smoking about 17 years ago. Her smoking use included cigarettes. She has never used smokeless tobacco. She reports that she does not drink alcohol and does not use drugs.  Review of Systems: Constitutional: negative for fever or malaise Ophthalmic: negative for photophobia, double vision or loss of vision Cardiovascular: negative for chest pain, dyspnea on exertion, or new LE swelling Respiratory: negative for SOB or persistent cough Gastrointestinal: negative for abdominal pain, change in bowel habits or melena Genitourinary: negative for dysuria or gross hematuria, no abnormal uterine bleeding or disharge Musculoskeletal: negative for new gait disturbance or muscular weakness Integumentary: negative for new or persistent rashes, no breast lumps Neurological: negative for TIA or stroke  symptoms Psychiatric: negative for SI or delusions Allergic/Immunologic: negative for hives  Patient Care Team    Relationship Specialty Notifications Start End  ALeamon Arnt MD PCP - General Family Medicine  01/14/20   YMarybelle Killings MD Consulting Physician Orthopedic Surgery  07/27/15   CAshok Pall MD Consulting Physician Neurosurgery  12/12/17   WHortencia Pilar MD Consulting Physician Surgery  01/03/18   WKathrynn Ducking MD (Inactive) Consulting Physician Neurology  04/02/19     Objective  Vitals: BP 128/78 Comment: by home readings  Pulse 83   Temp 98.6 F (37 C)   Ht '5\' 10"'$  (1.778 m)   Wt 252 lb 6.4 oz (114.5 kg)   SpO2 98%   BMI 36.22 kg/m  General:  Well developed, well nourished, no acute distress  Psych:  Alert and orientedx3,normal mood and affect HEENT:  Normocephalic, atraumatic, non-icteric sclera,  supple neck without adenopathy, mass or thyromegaly Cardiovascular:  Normal S1, S2, RRR without gallop, rub or murmur Respiratory:  Good breath sounds bilaterally, CTAB with normal respiratory effort Gastrointestinal: normal bowel sounds, soft, non-tender, no noted masses. No HSM MSK: no deformities, contusions. Joints are without erythema or swelling.  Skin:  Warm, no rashes or suspicious lesions noted Neurologic:    Mental status is normal. CN 2-11 are normal.  Gross motor and sensory exams are normal. Normal gait. No tremor Breast Exam: No mass, skin retraction or nipple discharge is appreciated in either breast. No axillary adenopathy. Fibrocystic changes {Actions; are/are not:16769} noted Pelvic Exam: Normal external genitalia, no vulvar or vaginal lesions present. Clear cervix w/o CMT. Bimanual exam reveals a nontender fundus w/o masses, nl size. No adnexal masses present. No inguinal adenopathy. A PAP smear {WAS/WAS [phone]::"was not"} performed.   Commons side effects, risks, benefits, and alternatives for medications and treatment plan prescribed  today were discussed, and the patient expressed understanding of the given instructions. Patient is instructed to call or message via MyChart if he/she has any questions or concerns regarding our treatment plan. No barriers to understanding were identified. We discussed Red Flag symptoms and signs in detail. Patient expressed understanding regarding what to do in case of urgent or emergency type symptoms.  Medication list was reconciled, printed and provided to the patient in AVS. Patient instructions and summary information was reviewed with the patient as documented in the AVS. This note was prepared with assistance of Dragon voice recognition software. Occasional wrong-word or sound-a-like substitutions may have occurred due to the inherent limitations of voice recognition software

## 2022-08-25 ENCOUNTER — Other Ambulatory Visit: Payer: Self-pay

## 2022-08-25 DIAGNOSIS — E7800 Pure hypercholesterolemia, unspecified: Secondary | ICD-10-CM

## 2022-08-25 MED ORDER — ROSUVASTATIN CALCIUM 10 MG PO TABS: 10.0000 mg | ORAL_TABLET | Freq: Every day | ORAL | 3 refills | Status: DC

## 2022-09-18 ENCOUNTER — Institutional Professional Consult (permissible substitution): Payer: Medicare HMO | Admitting: Diagnostic Neuroimaging

## 2022-09-20 ENCOUNTER — Encounter: Payer: Self-pay | Admitting: Diagnostic Neuroimaging

## 2022-09-20 ENCOUNTER — Ambulatory Visit (INDEPENDENT_AMBULATORY_CARE_PROVIDER_SITE_OTHER): Payer: Medicare HMO | Admitting: Diagnostic Neuroimaging

## 2022-09-20 VITALS — BP 141/80 | HR 68 | Ht 70.0 in | Wt 250.0 lb

## 2022-09-20 DIAGNOSIS — G20A1 Parkinson's disease without dyskinesia, without mention of fluctuations: Secondary | ICD-10-CM

## 2022-09-20 MED ORDER — CARBIDOPA-LEVODOPA 25-100 MG PO TABS: 1.0000 | ORAL_TABLET | Freq: Three times a day (TID) | ORAL | 6 refills | Status: DC

## 2022-09-20 NOTE — Progress Notes (Signed)
[facility]  PATIENT: Brooke Coleman DOB: 03-Mar-1951  REFERRING CLINICIAN: Willow Ora, MD HISTORY FROM: patient  REASON FOR VISIT: new consult    HISTORICAL  CHIEF COMPLAINT:  Chief Complaint  Patient presents with   New Patient (Initial Visit)    Patient in room #7 and alone. Patient states her right leg pain and tremor has gotten worse.    HISTORY OF PRESENT ILLNESS:   72 year old female here for evaluation of tremor.  Symptoms started around 2013.  She reports tremor in right hand progressing to left hand.  Has had some issues with progressive gait and balance difficulty.  She was evaluated by Dr. Anne Hahn in 2017 and 2020, who identified possible early parkinsonism.    REVIEW OF SYSTEMS: Full 14 system review of systems performed and negative with exception of: as per HPI.  ALLERGIES: Allergies  Allergen Reactions   Cortisone Anaphylaxis   Erythromycin Anaphylaxis   Neurontin [Gabapentin] Swelling   Egg-Derived Products    Prednisone Other (See Comments)    Pinching in chest   Flexeril [Cyclobenzaprine]     "pinching in chest"   Naproxen     "pinching in chest"    HOME MEDICATIONS: Outpatient Medications Prior to Visit  Medication Sig Dispense Refill   albuterol (VENTOLIN HFA) 108 (90 Base) MCG/ACT inhaler Inhale 2 puffs into the lungs every 6 (six) hours as needed for wheezing or shortness of breath. 18 g 1   amitriptyline (ELAVIL) 25 MG tablet Take 1 tablet (25 mg total) by mouth at bedtime. 90 tablet 3   amLODipine (NORVASC) 10 MG tablet Take 1 tablet (10 mg total) by mouth daily. 90 tablet 3   aspirin 81 MG tablet Take 81 mg by mouth daily.     EPINEPHrine 0.3 mg/0.3 mL IJ SOAJ injection Inject 0.3 mg into the muscle as needed for anaphylaxis. 1 each 1   ibuprofen (ADVIL) 800 MG tablet Take 1 tablet (800 mg total) by mouth every 8 (eight) hours as needed for moderate pain (with food). 90 tablet 0   levocetirizine (XYZAL ALLERGY  24HR) 5 MG tablet Take 1 tablet (5 mg total) by mouth every evening. 90 tablet 3   LORazepam (ATIVAN) 0.5 MG tablet Take 1 tablet (0.5 mg total) by mouth 2 (two) times daily as needed for anxiety. 30 tablet 1   meclizine (ANTIVERT) 12.5 MG tablet Take 1 tablet (12.5 mg total) by mouth 3 (three) times daily as needed for dizziness. 30 tablet 0   metoprolol succinate (TOPROL-XL) 50 MG 24 hr tablet Take 1 tablet (50 mg total) by mouth daily. 90 tablet 3   rosuvastatin (CRESTOR) 10 MG tablet Take 1 tablet (10 mg total) by mouth at bedtime. 90 tablet 3   sertraline (ZOLOFT) 100 MG tablet Take 1 tablet (100 mg total) by mouth daily. 90 tablet 3   tobramycin (TOBREX) 0.3 % ophthalmic solution Place 2 drops into the left eye in the morning, at noon, and at bedtime. 5 mL 0   traZODone (DESYREL) 50 MG tablet Take 1 tablet (50 mg total) by mouth at bedtime. 90 tablet 3   No facility-administered medications prior to visit.    PAST MEDICAL HISTORY: Past Medical History:  Diagnosis Date   Cervical spondylosis    Chronic neck pain 01/14/2020   Evaluated by ortho and NS: offered surgery but not clearly going to be helpful. Pt defers.    Chronic pain    Depression    Heart murmur  History of posterior vitreous detachment 01/03/2018   Hyperlipidemia    Hypertension    Mild intermittent asthma 01/14/2020   Tremors of nervous system     PAST SURGICAL HISTORY: History reviewed. No pertinent surgical history.  FAMILY HISTORY: Family History  Problem Relation Age of Onset   Pulmonary embolism Mother    Hypertension Father    Heart disease Father    Lung disease Sister    Syncope episode Sister    Lung disease Brother    Heart disease Brother    Cancer - Lung Brother     SOCIAL HISTORY: Social History   Socioeconomic History   Marital status: Legally Separated    Spouse name: Not on file   Number of children: 2   Years of education: AAS   Highest education level: Not on file  Occupational  History   Occupation: retired  Tobacco Use   Smoking status: Former    Types: Cigarettes    Quit date: 06/12/2005    Years since quitting: 17.2   Smokeless tobacco: Never  Substance and Sexual Activity   Alcohol use: No   Drug use: No   Sexual activity: Not on file  Other Topics Concern   Not on file  Social History Narrative   Lives at home w/ her daughter   Patient drinks 1 cup of coffee daily.   Patient is left handed.    Social Determinants of Health   Financial Resource Strain: Low Risk  (08/15/2022)   Overall Financial Resource Strain (CARDIA)    Difficulty of Paying Living Expenses: Not hard at all  Food Insecurity: No Food Insecurity (08/15/2022)   Hunger Vital Sign    Worried About Running Out of Food in the Last Year: Never true    Ran Out of Food in the Last Year: Never true  Transportation Needs: No Transportation Needs (08/15/2022)   PRAPARE - Administrator, Civil Service (Medical): No    Lack of Transportation (Non-Medical): No  Physical Activity: Insufficiently Active (08/15/2022)   Exercise Vital Sign    Days of Exercise per Week: 3 days    Minutes of Exercise per Session: 30 min  Stress: No Stress Concern Present (08/15/2022)   [employer] of Occupational Health - Occupational Stress Questionnaire    Feeling of Stress : Not at all  Social Connections: Moderately Isolated (08/15/2022)   Social Connection and Isolation Panel [NHANES]    Frequency of Communication with Friends and Family: More than three times a week    Frequency of Social Gatherings with Friends and Family: More than three times a week    Attends Religious Services: 1 to 4 times per year    Active Member of [employer] or Organizations: No    Attends Banker Meetings: Never    Marital Status: Separated  Intimate Partner Violence: Not At Risk (08/15/2022)   Humiliation, Afraid, Rape, and Kick questionnaire    Fear of Current or Ex-Partner: No    Emotionally Abused: No     Physically Abused: No    Sexually Abused: No     PHYSICAL EXAM  GENERAL EXAM/CONSTITUTIONAL: Vitals:  Vitals:   09/20/22 1103  BP: (!) 141/80  Pulse: 68  Weight: 250 lb (113.4 kg)  Height: 5\' 10"  (1.778 m)   Body mass index is 35.87 kg/m. Wt Readings from Last 3 Encounters:  09/20/22 250 lb (113.4 kg)  08/23/22 252 lb 6.4 oz (114.5 kg)  08/15/22 252 lb (114.3 kg)  Patient is in no distress; well developed, nourished and groomed; neck is supple  CARDIOVASCULAR: Examination of carotid arteries is normal; no carotid bruits Regular rate and rhythm, no murmurs Examination of peripheral vascular system by observation and palpation is normal  EYES: Ophthalmoscopic exam of optic discs and posterior segments is normal; no papilledema or hemorrhages No results found.  MUSCULOSKELETAL: Gait, strength, tone, movements noted in Neurologic exam below  NEUROLOGIC: MENTAL STATUS:      No data to display         awake, alert, oriented to person, place and time recent and remote memory intact normal attention and concentration language fluent, comprehension intact, naming intact fund of knowledge appropriate  CRANIAL NERVE:  2nd - no papilledema on fundoscopic exam 2nd, 3rd, 4th, 6th - pupils equal and reactive to light, visual fields full to confrontation, extraocular muscles intact, no nystagmus 5th - facial sensation symmetric 7th - facial strength symmetric 8th - hearing intact 9th - palate elevates symmetrically, uvula midline 11th - shoulder shrug symmetric 12th - tongue protrusion midline MASKED FACIES  MOTOR:  RESTING AND POSTURAL TREMOR IN BUE AND HEAD COGWHEELING RIGIDITY IN RUE > LUE BRADYKINESIA IN RUE > LUE; SLIGHT IN BLE full strength in the BUE, LLE; RLE LIMITED DUE TO PAIN PROXIMALLY  SENSORY:  normal and symmetric to light touch, temperature, vibration  COORDINATION:  finger-nose-finger, fine finger movements normal  REFLEXES:  deep tendon  reflexes present and symmetric  GAIT/STATION:  narrow based gait; SOME GAIT FREEZING ON INITIATION AND TURNING; RIGHT ARM TREMOR WITH WALKING     DIAGNOSTIC DATA (LABS, IMAGING, TESTING) - I reviewed patient records, labs, notes, testing and imaging myself where available.  Lab Results  Component Value Date   WBC 5.6 08/23/2022   HGB 14.7 08/23/2022   HCT 43.1 08/23/2022   MCV 92.2 08/23/2022   PLT 273.0 08/23/2022      Component Value Date/Time   NA 140 08/23/2022 1342   K 4.1 08/23/2022 1342   CL 104 08/23/2022 1342   CO2 28 08/23/2022 1342   GLUCOSE 89 08/23/2022 1342   BUN 10 08/23/2022 1342   CREATININE 0.77 08/23/2022 1342   CREATININE 0.74 01/14/2020 1515   CALCIUM 9.6 08/23/2022 1342   PROT 7.6 08/23/2022 1342   ALBUMIN 4.2 08/23/2022 1342   AST 13 08/23/2022 1342   ALT 8 08/23/2022 1342   ALKPHOS 93 08/23/2022 1342   BILITOT 0.7 08/23/2022 1342   GFRNONAA 56 (L) 07/16/2022 2355   GFRAA >60 10/02/2017 1529   Lab Results  Component Value Date   CHOL 237 (H) 08/23/2022   HDL 47.70 08/23/2022   LDLCALC 156 (H) 08/23/2022   TRIG 164.0 (H) 08/23/2022   CHOLHDL 5 08/23/2022   Lab Results  Component Value Date   HGBA1C 5.5 06/14/2021   Lab Results  Component Value Date   VITAMINB12 >1500 (H) 08/23/2022   Lab Results  Component Value Date   TSH 2.29 08/23/2022    07/17/22 MRI brain 1. No acute finding. 2. 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   ASSESSMENT AND PLAN  72 y.o. year old female here with:   Dx:  1. 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    Dispense:  90 tablet    Refill:  6   Return in about 6 months (around 03/22/2023).    Suanne MarkerVIKRAM R. Shelanda Duvall, MD 09/20/2022, 12:02 PM Certified in Neurology, Neurophysiology and Neuroimaging  [facility] [address], [address] [place], [place] [address] [phone]

## 2022-09-20 NOTE — Patient Instructions (Signed)
  PARKINSON'S DISEASE (resting tremor, masked facies, bradykinesia, cogwheel rigidity) - start carbidopa / levodopa (25/100) half tab three times a day with meals x 1-2 weeks; then 1 tab three times a day with meals

## 2022-09-21 ENCOUNTER — Encounter: Payer: Self-pay | Admitting: Family Medicine

## 2022-09-21 DIAGNOSIS — G20A1 Parkinson's disease without dyskinesia, without mention of fluctuations: Secondary | ICD-10-CM | POA: Insufficient documentation

## 2022-09-21 NOTE — Progress Notes (Signed)
Reviewed report/notes and updated pt's chart/history/PL and/or HM accordingly. 

## 2022-12-14 ENCOUNTER — Other Ambulatory Visit: Payer: Self-pay | Admitting: Family Medicine

## 2022-12-14 DIAGNOSIS — I10 Essential (primary) hypertension: Secondary | ICD-10-CM

## 2022-12-29 ENCOUNTER — Emergency Department (HOSPITAL_BASED_OUTPATIENT_CLINIC_OR_DEPARTMENT_OTHER): Payer: Medicare HMO

## 2022-12-29 ENCOUNTER — Other Ambulatory Visit: Payer: Self-pay

## 2022-12-29 ENCOUNTER — Encounter (HOSPITAL_BASED_OUTPATIENT_CLINIC_OR_DEPARTMENT_OTHER): Payer: Self-pay | Admitting: Emergency Medicine

## 2022-12-29 ENCOUNTER — Emergency Department (HOSPITAL_BASED_OUTPATIENT_CLINIC_OR_DEPARTMENT_OTHER)
Admission: EM | Admit: 2022-12-29 | Discharge: 2022-12-30 | Disposition: A | Discharge status: Home / Self Care | Payer: Medicare HMO | Attending: Emergency Medicine | Admitting: Emergency Medicine

## 2022-12-29 DIAGNOSIS — I10 Essential (primary) hypertension: Secondary | ICD-10-CM | POA: Insufficient documentation

## 2022-12-29 DIAGNOSIS — G20C Parkinsonism, unspecified: Secondary | ICD-10-CM | POA: Insufficient documentation

## 2022-12-29 DIAGNOSIS — U071 COVID-19: Secondary | ICD-10-CM | POA: Insufficient documentation

## 2022-12-29 DIAGNOSIS — Z7982 Long term (current) use of aspirin: Secondary | ICD-10-CM | POA: Diagnosis not present

## 2022-12-29 DIAGNOSIS — I700 Atherosclerosis of aorta: Secondary | ICD-10-CM | POA: Diagnosis not present

## 2022-12-29 DIAGNOSIS — Z79899 Other long term (current) drug therapy: Secondary | ICD-10-CM | POA: Insufficient documentation

## 2022-12-29 DIAGNOSIS — Z87891 Personal history of nicotine dependence: Secondary | ICD-10-CM | POA: Insufficient documentation

## 2022-12-29 DIAGNOSIS — R350 Frequency of micturition: Secondary | ICD-10-CM | POA: Diagnosis present

## 2022-12-29 LAB — LACTIC ACID, PLASMA: Lactic Acid, Venous: 1.7 mmol/L (ref 0.5–1.9)

## 2022-12-29 LAB — CBC WITH DIFFERENTIAL/PLATELET
Abs Immature Granulocytes: 0.01 10*3/uL (ref 0.00–0.07)
Basophils Absolute: 0 10*3/uL (ref 0.0–0.1)
Basophils Relative: 0 %
Eosinophils Absolute: 0 10*3/uL (ref 0.0–0.5)
Eosinophils Relative: 1 %
HCT: 43.3 % (ref 36.0–46.0)
Hemoglobin: 14.5 g/dL (ref 12.0–15.0)
Immature Granulocytes: 0 %
Lymphocytes Relative: 8 %
Lymphs Abs: 0.6 10*3/uL — ABNORMAL LOW (ref 0.7–4.0)
MCH: 31.3 pg (ref 26.0–34.0)
MCHC: 33.5 g/dL (ref 30.0–36.0)
MCV: 93.5 fL (ref 80.0–100.0)
Monocytes Absolute: 0.5 10*3/uL (ref 0.1–1.0)
Monocytes Relative: 7 %
Neutro Abs: 6.2 10*3/uL (ref 1.7–7.7)
Neutrophils Relative %: 84 %
Platelets: 263 10*3/uL (ref 150–400)
RBC: 4.63 MIL/uL (ref 3.87–5.11)
RDW: 13.1 % (ref 11.5–15.5)
WBC: 7.3 10*3/uL (ref 4.0–10.5)
nRBC: 0 % (ref 0.0–0.2)

## 2022-12-29 LAB — COMPREHENSIVE METABOLIC PANEL
ALT: 8 U/L (ref 0–44)
AST: 13 U/L — ABNORMAL LOW (ref 15–41)
Albumin: 4.6 g/dL (ref 3.5–5.0)
Alkaline Phosphatase: 77 U/L (ref 38–126)
Anion gap: 10 (ref 5–15)
BUN: 8 mg/dL (ref 8–23)
CO2: 27 mmol/L (ref 22–32)
Calcium: 9.8 mg/dL (ref 8.9–10.3)
Chloride: 98 mmol/L (ref 98–111)
Creatinine, Ser: 0.79 mg/dL (ref 0.44–1.00)
GFR, Estimated: >60 mL/min (ref >60)
Glucose, Bld: 107 mg/dL — ABNORMAL HIGH (ref 70–99)
Potassium: 4.2 mmol/L (ref 3.5–5.1)
Sodium: 135 mmol/L (ref 135–145)
Total Bilirubin: 0.8 mg/dL (ref 0.3–1.2)
Total Protein: 8.1 g/dL (ref 6.5–8.1)

## 2022-12-29 LAB — URINALYSIS, ROUTINE W REFLEX MICROSCOPIC
Bilirubin Urine: NEGATIVE
Glucose, UA: NEGATIVE mg/dL
Hgb urine dipstick: NEGATIVE
Ketones, ur: NEGATIVE mg/dL
Leukocytes,Ua: NEGATIVE
Nitrite: NEGATIVE
Protein, ur: NEGATIVE mg/dL
Specific Gravity, Urine: 1.015 (ref 1.005–1.030)
pH: 7.5 (ref 5.0–8.0)

## 2022-12-29 LAB — SARS CORONAVIRUS 2 BY RT PCR: SARS Coronavirus 2 by RT PCR: POSITIVE — AB

## 2022-12-29 LAB — CBG MONITORING, ED: Glucose-Capillary: 116 mg/dL — ABNORMAL HIGH (ref 70–99)

## 2022-12-29 LAB — PROTIME-INR
INR: 1 (ref 0.8–1.2)
Prothrombin Time: 13.5 seconds (ref 11.4–15.2)

## 2022-12-29 MED ORDER — SODIUM CHLORIDE 0.9 % IV SOLN: INTRAVENOUS | Status: DC

## 2022-12-29 MED ORDER — ACETAMINOPHEN 500 MG PO TABS
ORAL_TABLET | ORAL | Status: AC
  Administered 2022-12-29: 1000 mg via ORAL
  Filled 2022-12-29: qty 2

## 2022-12-29 MED ORDER — PAXLOVID (300/100) 20 X 150 MG & 10 X 100MG PO TBPK: 3.0000 | ORAL_TABLET | Freq: Two times a day (BID) | ORAL | 0 refills | Status: AC

## 2022-12-29 MED ORDER — SODIUM CHLORIDE 0.9 % IV BOLUS
500.0000 mL | Freq: Once | INTRAVENOUS | Status: AC
  Administered 2022-12-29: 500 mL via INTRAVENOUS

## 2022-12-29 MED ORDER — ACETAMINOPHEN 500 MG PO TABS: 1000.0000 mg | ORAL_TABLET | Freq: Once | ORAL | Status: AC

## 2022-12-29 NOTE — ED Triage Notes (Signed)
Pt presents to ED POV. Pt c/o urinary frequency since last night. Pt also c/o weakness and shaking.

## 2022-12-29 NOTE — ED Notes (Signed)
Awaiting dispo

## 2022-12-29 NOTE — ED Provider Notes (Signed)
[place] EMERGENCY DEPARTMENT AT [facility] Provider Note   CSN: [id number] Arrival date & time: 12/29/22  1846     History  Chief Complaint  Patient presents with   Urinary Frequency    Brooke Coleman is a 72 y.o. female.  Patient here for urinary frequency that started last night.  She is also felt very fatigued and weak and does have a fever 102.8 she did not know that.  Patient does have the diagnosis of Parkinson's so she has a normal tremor.  Heart rate 93 blood pressure of 148/91 oxygen sats 95% on room air.  Past medical history sniffer hypertension hyperlipidemia new diagnosis of Parkinson's.  Former tobacco smoker quit in 2007.  Patient last seen in the emergency department in February for dizziness that was secondary to hypokalemia.  No nausea no vomiting no rash no severe headache no upper respiratory symptoms.       Home Medications Prior to Admission medications   Medication Sig Start Date End Date Taking? Authorizing Provider  nirmatrelvir & ritonavir (PAXLOVID, 300/100,) 20 x 150 MG & 10 x 100MG  TBPK Take 3 tablets by mouth 2 (two) times daily for 5 days. 12/29/22 01/03/23 Yes Vanetta Mulders, MD  albuterol (VENTOLIN HFA) 108 (90 Base) MCG/ACT inhaler Inhale 2 puffs into the lungs every 6 (six) hours as needed for wheezing or shortness of breath. 06/15/20   Jarold Motto, PA  amitriptyline (ELAVIL) 25 MG tablet Take 1 tablet (25 mg total) by mouth at bedtime. 12/07/21   Willow Ora, MD  amLODipine (NORVASC) 10 MG tablet Take 1 tablet by mouth once daily 12/15/22   Willow Ora, MD  aspirin 81 MG tablet Take 81 mg by mouth daily.    [provider]  carbidopa-levodopa (SINEMET IR) 25-100 MG tablet Take 1 tablet by mouth 3 (three) times daily before meals. 09/20/22   Penumalli, Glenford Bayley, MD  EPINEPHrine 0.3 mg/0.3 mL IJ SOAJ injection Inject 0.3 mg into the muscle as needed for anaphylaxis. 06/08/21   Willow Ora, MD  ibuprofen (ADVIL) 800 MG  tablet Take 1 tablet (800 mg total) by mouth every 8 (eight) hours as needed for moderate pain (with food). 08/23/22   Willow Ora, MD  levocetirizine Elita Boone ALLERGY 24HR) 5 MG tablet Take 1 tablet (5 mg total) by mouth every evening. 12/01/20   Willow Ora, MD  LORazepam (ATIVAN) 0.5 MG tablet Take 1 tablet (0.5 mg total) by mouth 2 (two) times daily as needed for anxiety. 07/19/22   Willow Ora, MD  meclizine (ANTIVERT) 12.5 MG tablet Take 1 tablet (12.5 mg total) by mouth 3 (three) times daily as needed for dizziness. 07/17/22   Gloris Manchester, MD  metoprolol succinate (TOPROL-XL) 50 MG 24 hr tablet Take 1 tablet (50 mg total) by mouth daily. 12/07/21   Willow Ora, MD  rosuvastatin (CRESTOR) 10 MG tablet Take 1 tablet (10 mg total) by mouth at bedtime. 08/25/22   Willow Ora, MD  sertraline (ZOLOFT) 100 MG tablet Take 1 tablet (100 mg total) by mouth daily. 12/07/21   Willow Ora, MD  tobramycin (TOBREX) 0.3 % ophthalmic solution Place 2 drops into the left eye in the morning, at noon, and at bedtime. 03/22/22   Willow Ora, MD  traZODone (DESYREL) 50 MG tablet Take 1 tablet (50 mg total) by mouth at bedtime. 06/08/21   Willow Ora, MD      Allergies    Cortisone, Erythromycin,  Neurontin [gabapentin], Egg-derived products, Prednisone, Flexeril [cyclobenzaprine], and Naproxen    Review of Systems   Review of Systems  Constitutional:  Positive for fatigue and fever. Negative for chills.  HENT:  Negative for ear pain and sore throat.   Eyes:  Negative for pain and visual disturbance.  Respiratory:  Negative for cough and shortness of breath.   Cardiovascular:  Negative for chest pain and palpitations.  Gastrointestinal:  Negative for abdominal pain and vomiting.  Genitourinary:  Positive for frequency. Negative for dysuria and hematuria.  Musculoskeletal:  Negative for arthralgias and back pain.  Skin:  Negative for color change and rash.  Neurological:  Positive for  tremors. Negative for seizures and syncope.  All other systems reviewed and are negative.   Physical Exam Updated Vital Signs BP 109/65   Pulse 63   Temp 99.3 F (37.4 C) (Oral)   Resp (!) 27   Ht 1.778 m (5\' 10" )   Wt 113.4 kg   SpO2 100%   BMI 35.87 kg/m  Physical Exam Vitals and nursing note reviewed.  Constitutional:      General: She is not in acute distress.    Appearance: Normal appearance. She is well-developed.  HENT:     Head: Normocephalic and atraumatic.     Mouth/Throat:     Mouth: Mucous membranes are moist.  Eyes:     Extraocular Movements: Extraocular movements intact.     Conjunctiva/sclera: Conjunctivae normal.     Pupils: Pupils are equal, round, and reactive to light.  Cardiovascular:     Rate and Rhythm: Normal rate and regular rhythm.     Heart sounds: No murmur heard. Pulmonary:     Effort: Pulmonary effort is normal. No respiratory distress.     Breath sounds: Normal breath sounds. No wheezing, rhonchi or rales.  Abdominal:     Palpations: Abdomen is soft.     Tenderness: There is no abdominal tenderness.  Musculoskeletal:        General: No swelling.     Cervical back: Normal range of motion and neck supple. No rigidity.     Right lower leg: No edema.     Left lower leg: No edema.  Skin:    General: Skin is warm and dry.     Capillary Refill: Capillary refill takes less than 2 seconds.  Neurological:     General: No focal deficit present.     Mental Status: She is alert and oriented to person, place, and time.     Cranial Nerves: No cranial nerve deficit.     Sensory: No sensory deficit.     Motor: No weakness.     Comments: Patient has some baseline tremor secondary to her Parkinson's.  Psychiatric:        Mood and Affect: Mood normal.     ED Results / Procedures / Treatments   Labs (all labs ordered are listed, but only abnormal results are displayed) Labs Reviewed  SARS CORONAVIRUS 2 BY RT PCR - Abnormal; Notable for the  following components:      Result Value   SARS Coronavirus 2 by RT PCR POSITIVE (*)    All other components within normal limits  CBC WITH DIFFERENTIAL/PLATELET - Abnormal; Notable for the following components:   Lymphs Abs 0.6 (*)    All other components within normal limits  COMPREHENSIVE METABOLIC PANEL - Abnormal; Notable for the following components:   Glucose, Bld 107 (*)    AST 13 (*)  All other components within normal limits  CBG MONITORING, ED - Abnormal; Notable for the following components:   Glucose-Capillary 116 (*)    All other components within normal limits  CULTURE, BLOOD (ROUTINE X 2)  CULTURE, BLOOD (ROUTINE X 2)  URINALYSIS, ROUTINE W REFLEX MICROSCOPIC  LACTIC ACID, PLASMA  PROTIME-INR    EKG None  Radiology DG Chest Port 1 View  Result Date: 12/29/2022 CLINICAL DATA:  Weakness. Urinary frequency since last night. Weakness and shaking. EXAM: PORTABLE CHEST 1 VIEW COMPARISON:  10/02/2017 FINDINGS: Heart size and pulmonary vascularity are normal. Mediastinal contours appear intact. Calcification of the aorta. Peribronchial thickening with central interstitial changes likely representing chronic bronchitis. No airspace disease or consolidation. No pleural effusions. No pneumothorax. Mediastinal contours appear intact. IMPRESSION: Chronic bronchitic changes in the lungs. No evidence of active pulmonary disease. Electronically Signed   By: Burman Nieves M.D.   On: 12/29/2022 19:58    Procedures Procedures    Medications Ordered in ED Medications  0.9 %  sodium chloride infusion ( Intravenous New Bag/Given 12/29/22 2020)  acetaminophen (TYLENOL) tablet 1,000 mg (1,000 mg Oral Given 12/29/22 1924)  sodium chloride 0.9 % bolus 500 mL (0 mLs Intravenous Stopped 12/29/22 2121)    ED Course/ Medical Decision Making/ A&P                             Medical Decision Making Amount and/or Complexity of Data Reviewed Labs: ordered. Radiology:  ordered.  Risk OTC drugs. Prescription drug management.   His workup here without any acute findings other than she was positive for COVID.  INR is normal CBC with normal white count hemoglobin 14.5 platelets are normal at 263 lactic acid not elevated at 1.7 urinalysis negative no signs urinary tract infection.  Complete metabolic panel normal electrolytes normal renal function GFR is greater than 60 LFTs are normal.  Anion gap normal.  Chest x-ray chronic bronchitis changes in lung no evidence of active pulmonary disease.   Patient nontoxic not in any respiratory distress oxygen sats are in the mid 90s to 100% on room air.  Start patient on Paxlovid precautions provided.  Amending following up with primary care doctor in the next few days. Final Clinical Impression(s) / ED Diagnoses Final diagnoses:  COVID    Rx / DC Orders ED Discharge Orders          Ordered    nirmatrelvir & ritonavir (PAXLOVID, 300/100,) 20 x 150 MG & 10 x 100MG  TBPK  2 times daily        12/29/22 2342              Vanetta Mulders, MD 12/29/22 2346

## 2022-12-29 NOTE — Discharge Instructions (Signed)
Pick up the Paxlovid prescription tomorrow.  Take as directed.  Would recommend hydrating well extra strength Tylenol as needed.  Rest of workup today negative.  Isolate at least for 5 days.  Return for any new or worse symptoms in particular return for any trouble breathing or shortness of breath.  No evidence of urinary tract infection.

## 2022-12-30 LAB — CULTURE, BLOOD (ROUTINE X 2)

## 2023-01-01 LAB — CULTURE, BLOOD (ROUTINE X 2)
Culture: NO GROWTH
Culture: NO GROWTH

## 2023-01-02 LAB — CULTURE, BLOOD (ROUTINE X 2): Special Requests: ADEQUATE

## 2023-01-03 LAB — CULTURE, BLOOD (ROUTINE X 2)

## 2023-01-04 LAB — CULTURE, BLOOD (ROUTINE X 2)

## 2023-01-25 ENCOUNTER — Encounter (INDEPENDENT_AMBULATORY_CARE_PROVIDER_SITE_OTHER): Payer: Self-pay

## 2023-02-16 ENCOUNTER — Other Ambulatory Visit: Payer: Self-pay | Admitting: Family Medicine

## 2023-02-16 ENCOUNTER — Encounter: Payer: Self-pay | Admitting: Pharmacist

## 2023-02-21 ENCOUNTER — Ambulatory Visit (INDEPENDENT_AMBULATORY_CARE_PROVIDER_SITE_OTHER): Payer: Medicare HMO | Admitting: Family Medicine

## 2023-02-21 ENCOUNTER — Encounter: Payer: Self-pay | Admitting: Family Medicine

## 2023-02-21 VITALS — BP 130/72 | HR 66 | Temp 97.9°F | Ht 70.0 in | Wt 243.2 lb

## 2023-02-21 DIAGNOSIS — G20B1 Parkinson's disease with dyskinesia, without mention of fluctuations: Secondary | ICD-10-CM | POA: Diagnosis not present

## 2023-02-21 DIAGNOSIS — I10 Essential (primary) hypertension: Secondary | ICD-10-CM | POA: Diagnosis not present

## 2023-02-21 DIAGNOSIS — E782 Mixed hyperlipidemia: Secondary | ICD-10-CM

## 2023-02-21 DIAGNOSIS — F341 Dysthymic disorder: Secondary | ICD-10-CM | POA: Diagnosis not present

## 2023-02-21 MED ORDER — ROSUVASTATIN CALCIUM 10 MG PO TABS
10.0000 mg | ORAL_TABLET | Freq: Every day | ORAL | 3 refills | Status: DC
Start: 2023-02-21 — End: 2023-09-05

## 2023-02-21 MED ORDER — IBUPROFEN 800 MG PO TABS: 800.0000 mg | ORAL_TABLET | Freq: Three times a day (TID) | ORAL | 0 refills | Status: AC | PRN

## 2023-02-21 NOTE — Progress Notes (Signed)
Subjective  CC:  Chief Complaint  Patient presents with   Hypertension   Tremors   Hyperlipidemia    HPI: Brooke Coleman is a 72 y.o. female who presents to the office today to address the problems listed above in the chief complaint. Hypertension f/u: Patient is here for follow-up, however she is having some left-sided sciatica pain.  Her blood pressure in the office is elevated, however she does have a whitecoat component.  She checks her blood pressure daily, blood pressures are 120s to 130s over 70s consistently.  She denies chest pain, shortness of breath or lower extremity edema. Hyperlipidemia: She was to start Crestor 10 mg nightly back in February when her cholesterol was checked.  However she was skeptical and did not start it. Parkinson's disease: She saw neurology in April.  He recommended Sinemet however she is concerned about possible side effects and related heart disease.  She has not taken it.  She continues to have tremors.  No recent falls.  No memory concerns. Dysthymia is controlled on Zoloft.  Assessment  1. Essential hypertension   2. Mixed hyperlipidemia   3. Parkinson's disease with dyskinesia, unspecified whether manifestations fluctuate   4. Dysthymia      Plan   Hypertension f/u: BP control is well controlled.  Continue home readings.  Continue current medications. Hyperlipidemia: Educated.  Patient to start Crestor.  Sent in prescription.  Questions answered Parkinson's: Encouraged her to try medications.  She is skeptical Dysthymia is well-controlled  Education regarding management of these chronic disease states was given. Management strategies discussed on successive visits include dietary and exercise recommendations, goals of achieving and maintaining IBW, and lifestyle modifications aiming for adequate sleep and minimizing stressors.   Follow up: 6 months for complete physical and follow-up blood pressure and hyperlipidemia  No orders of the  defined types were placed in this encounter.  Meds ordered this encounter  Medications   ibuprofen (ADVIL) 800 MG tablet    Sig: Take 1 tablet (800 mg total) by mouth every 8 (eight) hours as needed for moderate pain (with food).    Dispense:  30 tablet    Refill:  0   rosuvastatin (CRESTOR) 10 MG tablet    Sig: Take 1 tablet (10 mg total) by mouth at bedtime.    Dispense:  90 tablet    Refill:  3      BP Readings from Last 3 Encounters:  02/21/23 130/72  12/30/22 121/69  09/20/22 (!) 141/80   Wt Readings from Last 3 Encounters:  02/21/23 243 lb 3.2 oz (110.3 kg)  12/29/22 250 lb (113.4 kg)  09/20/22 250 lb (113.4 kg)    Lab Results  Component Value Date   CHOL 237 (H) 08/23/2022   CHOL 206 (H) 06/14/2021   CHOL 226 (H) 01/14/2020   Lab Results  Component Value Date   HDL 47.70 08/23/2022   HDL 46.80 06/14/2021   HDL 52 01/14/2020   Lab Results  Component Value Date   LDLCALC 156 (H) 08/23/2022   LDLCALC 131 (H) 06/14/2021   LDLCALC 150 (H) 01/14/2020   Lab Results  Component Value Date   TRIG 164.0 (H) 08/23/2022   TRIG 139.0 06/14/2021   TRIG 122 01/14/2020   Lab Results  Component Value Date   CHOLHDL 5 08/23/2022   CHOLHDL 4 06/14/2021   CHOLHDL 4.3 01/14/2020   No results found for: "LDLDIRECT" Lab Results  Component Value Date   CREATININE 0.79 12/29/2022   BUN  8 12/29/2022   NA 135 12/29/2022   K 4.2 12/29/2022   CL 98 12/29/2022   CO2 27 12/29/2022    The 10-year ASCVD risk score (Arnett DK, et al., 2019) is: 13.5%   Values used to calculate the score:     Age: 57 years     Sex: Female     Is Non-Hispanic African American: Yes     Diabetic: No     Tobacco smoker: No     Systolic Blood Pressure: 130 mmHg     Is BP treated: Yes     HDL Cholesterol: 47.7 mg/dL     Total Cholesterol: 237 mg/dL  I reviewed the patients updated PMH, FH, and SocHx.    Patient Active Problem List   Diagnosis Date Noted   Parkinson disease 09/21/2022     Priority: High   Chronic neck pain 01/14/2020    Priority: High   Chronic insomnia 01/24/2017    Priority: High   Essential hypertension 11/07/2016    Priority: High   Dysthymia 11/07/2016    Priority: High   Cervical spondylosis without myelopathy 06/04/2015    Priority: High   Primary osteoarthritis of both hands 01/14/2020    Priority: Medium    Primary osteoarthritis of both wrists 01/14/2020    Priority: Medium    Mild intermittent asthma 01/14/2020    Priority: Medium    Gastroesophageal reflux disease without esophagitis 11/07/2016    Priority: Medium    Bilateral carpal tunnel syndrome 06/04/2015    Priority: Medium    Vitamin D deficiency 01/14/2020    Priority: Low   Vitamin B12 deficiency 01/14/2020    Priority: Low   Drusen of macula, bilateral 01/03/2018    Priority: Low   History of anaphylaxis 06/08/2021    Allergies: Cortisone, Erythromycin, Neurontin [gabapentin], Egg-derived products, Prednisone, Flexeril [cyclobenzaprine], and Naproxen  Social History: Patient  reports that she quit smoking about 17 years ago. Her smoking use included cigarettes. She has never used smokeless tobacco. She reports that she does not drink alcohol and does not use drugs.  Current Meds  Medication Sig   albuterol (VENTOLIN HFA) 108 (90 Base) MCG/ACT inhaler Inhale 2 puffs into the lungs every 6 (six) hours as needed for wheezing or shortness of breath.   amitriptyline (ELAVIL) 25 MG tablet Take 1 tablet (25 mg total) by mouth at bedtime.   amLODipine (NORVASC) 10 MG tablet Take 1 tablet by mouth once daily   aspirin 81 MG tablet Take 81 mg by mouth daily.   carbidopa-levodopa (SINEMET IR) 25-100 MG tablet Take 1 tablet by mouth 3 (three) times daily before meals.   EPINEPHrine 0.3 mg/0.3 mL IJ SOAJ injection Inject 0.3 mg into the muscle as needed for anaphylaxis.   levocetirizine (XYZAL ALLERGY 24HR) 5 MG tablet Take 1 tablet (5 mg total) by mouth every evening.    LORazepam (ATIVAN) 0.5 MG tablet Take 1 tablet (0.5 mg total) by mouth 2 (two) times daily as needed for anxiety.   meclizine (ANTIVERT) 12.5 MG tablet Take 1 tablet (12.5 mg total) by mouth 3 (three) times daily as needed for dizziness.   metoprolol succinate (TOPROL-XL) 50 MG 24 hr tablet TAKE 1 TABLET BY MOUTH ONCE DAILY (INCREASING  DOSE  FROM  25MG   DAILY)   sertraline (ZOLOFT) 100 MG tablet Take 1 tablet (100 mg total) by mouth daily.   tobramycin (TOBREX) 0.3 % ophthalmic solution Place 2 drops into the left eye in the morning, at  noon, and at bedtime.   traZODone (DESYREL) 50 MG tablet Take 1 tablet (50 mg total) by mouth at bedtime.   [DISCONTINUED] ibuprofen (ADVIL) 800 MG tablet Take 1 tablet (800 mg total) by mouth every 8 (eight) hours as needed for moderate pain (with food).   [DISCONTINUED] rosuvastatin (CRESTOR) 10 MG tablet Take 1 tablet (10 mg total) by mouth at bedtime.    Review of Systems: Cardiovascular: negative for chest pain, palpitations, leg swelling, orthopnea Respiratory: negative for SOB, wheezing or persistent cough Gastrointestinal: negative for abdominal pain Genitourinary: negative for dysuria or gross hematuria  Objective  Vitals: BP 130/72 Comment: by daily home readings  Pulse 66   Temp 97.9 F (36.6 C)   Ht 5\' 10"  (1.778 m)   Wt 243 lb 3.2 oz (110.3 kg)   SpO2 99%   BMI 34.90 kg/m  General: no acute distress  Psych:  Alert and oriented, normal mood and affect HEENT:  Normocephalic, atraumatic, supple neck  Cardiovascular:  RRR without murmur. no edema Respiratory:  Good breath sounds bilaterally, CTAB with normal respiratory effort Neuro: Resting bilateral upper extremity tremor, head tremor Commons side effects, risks, benefits, and alternatives for medications and treatment plan prescribed today were discussed, and the patient expressed understanding of the given instructions. Patient is instructed to call or message via MyChart if he/she has  any questions or concerns regarding our treatment plan. No barriers to understanding were identified. We discussed Red Flag symptoms and signs in detail. Patient expressed understanding regarding what to do in case of urgent or emergency type symptoms.  Medication list was reconciled, printed and provided to the patient in AVS. Patient instructions and summary information was reviewed with the patient as documented in the AVS. This note was prepared with assistance of Dragon voice recognition software. Occasional wrong-word or sound-a-like substitutions may have occurred due to the inherent limitation

## 2023-02-21 NOTE — Patient Instructions (Signed)
Please return in 6 months for your annual complete physical; please come fasting.   If you have any questions or concerns, please don't hesitate to send me a message via MyChart or call the office at [phone]. Thank you for visiting with us today! It's our pleasure caring for you.  

## 2023-03-28 ENCOUNTER — Ambulatory Visit (INDEPENDENT_AMBULATORY_CARE_PROVIDER_SITE_OTHER): Payer: Medicare HMO | Admitting: Diagnostic Neuroimaging

## 2023-03-28 ENCOUNTER — Encounter: Payer: Self-pay | Admitting: Diagnostic Neuroimaging

## 2023-03-28 VITALS — BP 151/81 | HR 72 | Ht 70.0 in | Wt 244.6 lb

## 2023-03-28 DIAGNOSIS — G20A1 Parkinson's disease without dyskinesia, without mention of fluctuations: Secondary | ICD-10-CM

## 2023-03-28 NOTE — Patient Instructions (Signed)
  PARKINSON'S DISEASE (resting tremor, masked facies, bradykinesia, cogwheel rigidity) - encouraged to try carbidopa / levodopa (25/100) half tab three times a day with meals x 1-2 weeks; then 1 tab three times a day with meals

## 2023-03-28 NOTE — Progress Notes (Signed)
[facility]  PATIENT: Brooke Coleman DOB: Mar 18, 1951  REFERRING CLINICIAN: Willow Ora, MD HISTORY FROM: patient and husband REASON FOR VISIT: follow up   HISTORICAL  CHIEF COMPLAINT:  Chief Complaint  Patient presents with   Follow-up    Patient in room #6 with her husband. Patient states she been doing okay but has had a lot going in her life.     HISTORY OF PRESENT ILLNESS:   UPDATE (03/28/23, VRP): Since last visit, doing about the same. Did not try carb/levo because she was nervous about possible side effects. Tremors continue.   PRIOR HPI (09/20/22, VRP): 72 year old female here for evaluation of tremor.  Symptoms started around 2013.  She reports tremor in right hand progressing to left hand.  Has had some issues with progressive gait and balance difficulty.  She was evaluated by Dr. Anne Hahn in 2017 and 2020, who identified possible early parkinsonism.    REVIEW OF SYSTEMS: Full 14 system review of systems performed and negative with exception of: as per HPI.  ALLERGIES: Allergies  Allergen Reactions   Cortisone Anaphylaxis   Erythromycin Anaphylaxis   Neurontin [Gabapentin] Swelling   Egg-Derived Products    Prednisone Other (See Comments)    Pinching in chest   Flexeril [Cyclobenzaprine]     "pinching in chest"   Naproxen     "pinching in chest"    HOME MEDICATIONS: Outpatient Medications Prior to Visit  Medication Sig Dispense Refill   albuterol (VENTOLIN HFA) 108 (90 Base) MCG/ACT inhaler Inhale 2 puffs into the lungs every 6 (six) hours as needed for wheezing or shortness of breath. 18 g 1   amitriptyline (ELAVIL) 25 MG tablet Take 1 tablet (25 mg total) by mouth at bedtime. 90 tablet 3   amLODipine (NORVASC) 10 MG tablet Take 1 tablet by mouth once daily 90 tablet 0   aspirin 81 MG tablet Take 81 mg by mouth daily.     carbidopa-levodopa (SINEMET IR) 25-100 MG tablet Take 1 tablet by mouth 3 (three) times daily before meals. 90  tablet 6   EPINEPHrine 0.3 mg/0.3 mL IJ SOAJ injection Inject 0.3 mg into the muscle as needed for anaphylaxis. 1 each 1   ibuprofen (ADVIL) 800 MG tablet Take 1 tablet (800 mg total) by mouth every 8 (eight) hours as needed for moderate pain (with food). 30 tablet 0   levocetirizine (XYZAL ALLERGY 24HR) 5 MG tablet Take 1 tablet (5 mg total) by mouth every evening. 90 tablet 3   LORazepam (ATIVAN) 0.5 MG tablet Take 1 tablet (0.5 mg total) by mouth 2 (two) times daily as needed for anxiety. 30 tablet 1   meclizine (ANTIVERT) 12.5 MG tablet Take 1 tablet (12.5 mg total) by mouth 3 (three) times daily as needed for dizziness. 30 tablet 0   metoprolol succinate (TOPROL-XL) 50 MG 24 hr tablet TAKE 1 TABLET BY MOUTH ONCE DAILY (INCREASING  DOSE  FROM  25MG   DAILY) 90 tablet 0   rosuvastatin (CRESTOR) 10 MG tablet Take 1 tablet (10 mg total) by mouth at bedtime. 90 tablet 3   sertraline (ZOLOFT) 100 MG tablet Take 1 tablet (100 mg total) by mouth daily. 90 tablet 3   traZODone (DESYREL) 50 MG tablet Take 1 tablet (50 mg total) by mouth at bedtime. 90 tablet 3   tobramycin (TOBREX) 0.3 % ophthalmic solution Place 2 drops into the left eye in the morning, at noon, and at bedtime. (Patient not taking: Reported on 03/28/2023) 5 mL  0   No facility-administered medications prior to visit.    PAST MEDICAL HISTORY: Past Medical History:  Diagnosis Date   Cervical spondylosis    Chronic neck pain 01/14/2020   Evaluated by ortho and NS: offered surgery but not clearly going to be helpful. Pt defers.    Chronic pain    Depression    Heart murmur    History of posterior vitreous detachment 01/03/2018   Hyperlipidemia    Hypertension    Mild intermittent asthma 01/14/2020   Tremors of nervous system     PAST SURGICAL HISTORY: History reviewed. No pertinent surgical history.  FAMILY HISTORY: Family History  Problem Relation Age of Onset   Pulmonary embolism Mother    Hypertension Father    Heart  disease Father    Lung disease Sister    Syncope episode Sister    Lung disease Brother    Heart disease Brother    Cancer - Lung Brother     SOCIAL HISTORY: Social History   Socioeconomic History   Marital status: Legally Separated    Spouse name: Not on file   Number of children: 2   Years of education: AAS   Highest education level: Not on file  Occupational History   Occupation: retired  Tobacco Use   Smoking status: Former    Current packs/day: 0.00    Types: Cigarettes    Quit date: 06/12/2005    Years since quitting: 17.8   Smokeless tobacco: Never  Substance and Sexual Activity   Alcohol use: No   Drug use: No   Sexual activity: Not on file  Other Topics Concern   Not on file  Social History Narrative   Lives at home w/ her daughter   Patient drinks 1 cup of coffee daily.   Patient is left handed.    Social Determinants of Health   Financial Resource Strain: Low Risk  (08/15/2022)   Overall Financial Resource Strain (CARDIA)    Difficulty of Paying Living Expenses: Not hard at all  Food Insecurity: No Food Insecurity (08/15/2022)   Hunger Vital Sign    Worried About Running Out of Food in the Last Year: Never true    Ran Out of Food in the Last Year: Never true  Transportation Needs: No Transportation Needs (08/15/2022)   PRAPARE - Administrator, Civil Service (Medical): No    Lack of Transportation (Non-Medical): No  Physical Activity: Insufficiently Active (08/15/2022)   Exercise Vital Sign    Days of Exercise per Week: 3 days    Minutes of Exercise per Session: 30 min  Stress: No Stress Concern Present (08/15/2022)   [employer] of Occupational Health - Occupational Stress Questionnaire    Feeling of Stress : Not at all  Social Connections: Moderately Isolated (08/15/2022)   Social Connection and Isolation Panel [NHANES]    Frequency of Communication with Friends and Family: More than three times a week    Frequency of Social Gatherings with  Friends and Family: More than three times a week    Attends Religious Services: 1 to 4 times per year    Active Member of [employer] or Organizations: No    Attends Banker Meetings: Never    Marital Status: Separated  Intimate Partner Violence: Not At Risk (08/15/2022)   Humiliation, Afraid, Rape, and Kick questionnaire    Fear of Current or Ex-Partner: No    Emotionally Abused: No    Physically Abused: No  Sexually Abused: No     PHYSICAL EXAM  GENERAL EXAM/CONSTITUTIONAL: Vitals:  Vitals:   03/28/23 1140  BP: (!) 151/81  Pulse: 72  Weight: 244 lb 9.6 oz (110.9 kg)  Height: 5\' 10"  (1.778 m)   Body mass index is 35.1 kg/m. Wt Readings from Last 3 Encounters:  03/28/23 244 lb 9.6 oz (110.9 kg)  02/21/23 243 lb 3.2 oz (110.3 kg)  12/29/22 250 lb (113.4 kg)   Patient is in no distress; well developed, nourished and groomed; neck is supple  CARDIOVASCULAR: Examination of carotid arteries is normal; no carotid bruits Regular rate and rhythm, no murmurs Examination of peripheral vascular system by observation and palpation is normal  EYES: Ophthalmoscopic exam of optic discs and posterior segments is normal; no papilledema or hemorrhages No results found.  MUSCULOSKELETAL: Gait, strength, tone, movements noted in Neurologic exam below  NEUROLOGIC: MENTAL STATUS:      No data to display         awake, alert, oriented to person, place and time recent and remote memory intact normal attention and concentration language fluent, comprehension intact, naming intact fund of knowledge appropriate  CRANIAL NERVE:  2nd - no papilledema on fundoscopic exam 2nd, 3rd, 4th, 6th - pupils equal and reactive to light, visual fields full to confrontation, extraocular muscles intact, no nystagmus 5th - facial sensation symmetric 7th - facial strength symmetric 8th - hearing intact 9th - palate elevates symmetrically, uvula midline 11th - shoulder shrug  symmetric 12th - tongue protrusion midline MASKED FACIES  MOTOR:  RESTING AND POSTURAL TREMOR IN BUE AND HEAD COGWHEELING RIGIDITY IN RUE > LUE BRADYKINESIA IN RUE > LUE; SLIGHT IN BLE full strength in the BUE, LLE; RLE LIMITED DUE TO PAIN PROXIMALLY  SENSORY:  normal and symmetric to light touch, temperature, vibration  COORDINATION:  finger-nose-finger, fine finger movements normal  REFLEXES:  deep tendon reflexes present and symmetric  GAIT/STATION:  narrow based gait; SOME GAIT FREEZING ON INITIATION AND TURNING; RIGHT ARM TREMOR WITH WALKING     DIAGNOSTIC DATA (LABS, IMAGING, TESTING) - I reviewed patient records, labs, notes, testing and imaging myself where available.  Lab Results  Component Value Date   WBC 7.3 12/29/2022   HGB 14.5 12/29/2022   HCT 43.3 12/29/2022   MCV 93.5 12/29/2022   PLT 263 12/29/2022      Component Value Date/Time   NA 135 12/29/2022 1920   K 4.2 12/29/2022 1920   CL 98 12/29/2022 1920   CO2 27 12/29/2022 1920   GLUCOSE 107 (H) 12/29/2022 1920   BUN 8 12/29/2022 1920   CREATININE 0.79 12/29/2022 1920   CREATININE 0.74 01/14/2020 1515   CALCIUM 9.8 12/29/2022 1920   PROT 8.1 12/29/2022 1920   ALBUMIN 4.6 12/29/2022 1920   AST 13 (L) 12/29/2022 1920   ALT 8 12/29/2022 1920   ALKPHOS 77 12/29/2022 1920   BILITOT 0.8 12/29/2022 1920   GFRNONAA >60 12/29/2022 1920   GFRAA >60 10/02/2017 1529   Lab Results  Component Value Date   CHOL 237 (H) 08/23/2022   HDL 47.70 08/23/2022   LDLCALC 156 (H) 08/23/2022   TRIG 164.0 (H) 08/23/2022   CHOLHDL 5 08/23/2022   Lab Results  Component Value Date   HGBA1C 5.5 06/14/2021   Lab Results  Component Value Date   VITAMINB12 >1500 (H) 08/23/2022   Lab Results  Component Value Date   TSH 2.29 08/23/2022    07/17/22 MRI brain 1. No acute finding. 2. Chronic  small vessel ischemia in the cerebral white matter that is progressed from 2017 but still mild.   ASSESSMENT AND  PLAN  72 y.o. year old female here with:   Dx:  1. Parkinson's disease without dyskinesia or fluctuating manifestations (HCC)      PLAN:  PARKINSON'S DISEASE (resting tremor, masked facies, bradykinesia, cogwheel rigidity) - encouraged to try carbidopa / levodopa (25/100) half tab three times a day with meals x 1-2 weeks; then 1 tab three times a day with meals  Return in about 6 months (around 09/26/2023) for MyChart visit (15 min).    Suanne Marker, MD 03/28/2023, 12:10 PM Certified in Neurology, Neurophysiology and Neuroimaging  [facility] [address] [place], [place] [address] [phone]

## 2023-04-02 ENCOUNTER — Other Ambulatory Visit: Payer: Self-pay | Admitting: Family Medicine

## 2023-04-02 DIAGNOSIS — I10 Essential (primary) hypertension: Secondary | ICD-10-CM

## 2023-05-22 ENCOUNTER — Other Ambulatory Visit: Payer: Self-pay | Admitting: Family Medicine

## 2023-06-14 ENCOUNTER — Encounter: Payer: Self-pay | Admitting: Diagnostic Neuroimaging

## 2023-06-14 ENCOUNTER — Telehealth: Payer: Self-pay | Admitting: Diagnostic Neuroimaging

## 2023-06-14 NOTE — Telephone Encounter (Signed)
 LVM and sent letter in mail informing pt of need to reschedule 09/25/23 appt - MD out

## 2023-06-29 ENCOUNTER — Other Ambulatory Visit: Payer: Self-pay | Admitting: Family Medicine

## 2023-06-29 DIAGNOSIS — I10 Essential (primary) hypertension: Secondary | ICD-10-CM

## 2023-08-22 ENCOUNTER — Other Ambulatory Visit: Payer: Self-pay | Admitting: Family Medicine

## 2023-08-25 ENCOUNTER — Emergency Department (HOSPITAL_BASED_OUTPATIENT_CLINIC_OR_DEPARTMENT_OTHER)
Admission: EM | Admit: 2023-08-25 | Discharge: 2023-08-25 | Disposition: A | Discharge status: Home / Self Care | Attending: Emergency Medicine | Admitting: Emergency Medicine

## 2023-08-25 ENCOUNTER — Other Ambulatory Visit: Payer: Self-pay

## 2023-08-25 ENCOUNTER — Emergency Department (HOSPITAL_BASED_OUTPATIENT_CLINIC_OR_DEPARTMENT_OTHER)

## 2023-08-25 ENCOUNTER — Encounter (HOSPITAL_BASED_OUTPATIENT_CLINIC_OR_DEPARTMENT_OTHER): Payer: Self-pay | Admitting: Emergency Medicine

## 2023-08-25 DIAGNOSIS — R112 Nausea with vomiting, unspecified: Secondary | ICD-10-CM | POA: Diagnosis present

## 2023-08-25 DIAGNOSIS — I10 Essential (primary) hypertension: Secondary | ICD-10-CM | POA: Insufficient documentation

## 2023-08-25 DIAGNOSIS — R197 Diarrhea, unspecified: Secondary | ICD-10-CM

## 2023-08-25 DIAGNOSIS — K5792 Diverticulitis of intestine, part unspecified, without perforation or abscess without bleeding: Secondary | ICD-10-CM

## 2023-08-25 DIAGNOSIS — Z7982 Long term (current) use of aspirin: Secondary | ICD-10-CM | POA: Insufficient documentation

## 2023-08-25 DIAGNOSIS — R739 Hyperglycemia, unspecified: Secondary | ICD-10-CM | POA: Insufficient documentation

## 2023-08-25 DIAGNOSIS — K5732 Diverticulitis of large intestine without perforation or abscess without bleeding: Secondary | ICD-10-CM | POA: Diagnosis not present

## 2023-08-25 DIAGNOSIS — G20C Parkinsonism, unspecified: Secondary | ICD-10-CM | POA: Insufficient documentation

## 2023-08-25 DIAGNOSIS — Z79899 Other long term (current) drug therapy: Secondary | ICD-10-CM | POA: Diagnosis not present

## 2023-08-25 LAB — CBC WITH DIFFERENTIAL/PLATELET
Abs Immature Granulocytes: 0.01 10*3/uL (ref 0.00–0.07)
Basophils Absolute: 0 10*3/uL (ref 0.0–0.1)
Basophils Relative: 0 %
Eosinophils Absolute: 0.3 10*3/uL (ref 0.0–0.5)
Eosinophils Relative: 5 %
HCT: 43.1 % (ref 36.0–46.0)
Hemoglobin: 14.5 g/dL (ref 12.0–15.0)
Immature Granulocytes: 0 %
Lymphocytes Relative: 12 %
Lymphs Abs: 0.8 10*3/uL (ref 0.7–4.0)
MCH: 31.4 pg (ref 26.0–34.0)
MCHC: 33.6 g/dL (ref 30.0–36.0)
MCV: 93.3 fL (ref 80.0–100.0)
Monocytes Absolute: 0.3 10*3/uL (ref 0.1–1.0)
Monocytes Relative: 5 %
Neutro Abs: 4.8 10*3/uL (ref 1.7–7.7)
Neutrophils Relative %: 78 %
Platelets: 257 10*3/uL (ref 150–400)
RBC: 4.62 MIL/uL (ref 3.87–5.11)
RDW: 12.9 % (ref 11.5–15.5)
WBC: 6.3 10*3/uL (ref 4.0–10.5)
nRBC: 0 % (ref 0.0–0.2)

## 2023-08-25 LAB — URINALYSIS, ROUTINE W REFLEX MICROSCOPIC
Bilirubin Urine: NEGATIVE
Glucose, UA: NEGATIVE mg/dL
Ketones, ur: NEGATIVE mg/dL
Nitrite: NEGATIVE
Protein, ur: 30 mg/dL — AB
Specific Gravity, Urine: 1.033 — ABNORMAL HIGH (ref 1.005–1.030)
pH: 5.5 (ref 5.0–8.0)

## 2023-08-25 LAB — COMPREHENSIVE METABOLIC PANEL
ALT: 12 U/L (ref 0–44)
AST: 16 U/L (ref 15–41)
Albumin: 4.7 g/dL (ref 3.5–5.0)
Alkaline Phosphatase: 86 U/L (ref 38–126)
Anion gap: 10 (ref 5–15)
BUN: 15 mg/dL (ref 8–23)
CO2: 27 mmol/L (ref 22–32)
Calcium: 9.4 mg/dL (ref 8.9–10.3)
Chloride: 103 mmol/L (ref 98–111)
Creatinine, Ser: 0.92 mg/dL (ref 0.44–1.00)
GFR, Estimated: >60 mL/min (ref >60)
Glucose, Bld: 117 mg/dL — ABNORMAL HIGH (ref 70–99)
Potassium: 4 mmol/L (ref 3.5–5.1)
Sodium: 140 mmol/L (ref 135–145)
Total Bilirubin: 1 mg/dL (ref 0.0–1.2)
Total Protein: 7.6 g/dL (ref 6.5–8.1)

## 2023-08-25 LAB — LACTIC ACID, PLASMA: Lactic Acid, Venous: 1.7 mmol/L (ref 0.5–1.9)

## 2023-08-25 LAB — CBG MONITORING, ED: Glucose-Capillary: 111 mg/dL — ABNORMAL HIGH (ref 70–99)

## 2023-08-25 LAB — LIPASE, BLOOD: Lipase: <10 U/L — ABNORMAL LOW (ref 11–51)

## 2023-08-25 MED ORDER — FAMOTIDINE IN NACL 20-0.9 MG/50ML-% IV SOLN
20.0000 mg | Freq: Once | INTRAVENOUS | Status: AC
  Administered 2023-08-25: 20 mg via INTRAVENOUS
  Filled 2023-08-25: qty 50

## 2023-08-25 MED ORDER — LACTATED RINGERS IV BOLUS
1000.0000 mL | Freq: Once | INTRAVENOUS | Status: AC
  Administered 2023-08-25: 1000 mL via INTRAVENOUS

## 2023-08-25 MED ORDER — AMOXICILLIN-POT CLAVULANATE 875-125 MG PO TABS
1.0000 | ORAL_TABLET | Freq: Once | ORAL | Status: AC
  Administered 2023-08-25: 1 via ORAL
  Filled 2023-08-25: qty 1

## 2023-08-25 MED ORDER — AMOXICILLIN-POT CLAVULANATE 875-125 MG PO TABS: 1.0000 | ORAL_TABLET | Freq: Two times a day (BID) | ORAL | 0 refills | Status: DC

## 2023-08-25 MED ORDER — ONDANSETRON 4 MG PO TBDP: 4.0000 mg | ORAL_TABLET | Freq: Three times a day (TID) | ORAL | 0 refills | Status: DC | PRN

## 2023-08-25 NOTE — Discharge Instructions (Addendum)
 You were seen in the emerged from today for evaluation of your nausea, vomiting, and diarrhea.  I am glad that you are feeling better.  Your CT scan shows that you have diverticulitis.  I have included more information on this into your discharge paperwork for you to review.  It is important that you follow-up with your GI provider for your repeat colonoscopy and further surveillance of this.  I am sending you home with something called Zofran which she can take as needed for nausea.  You will also need to be treated with an antibiotic for the diverticulitis.  I am going to send you home with 1 called Augmentin.  You will take this twice a day for the next week.  I have included more patient on a bland diet as well.  Please make sure that you are staying well-hydrated drinking plenty of fluids, mainly water.  If you have any concerns, new or worsening symptoms, please return to your nearest emergency department for reevaluation.  Contact a doctor if: Your pain gets worse. You are not pooping like normal. Your symptoms do not get better. Your symptoms get worse very fast. You have a fever. You vomit more than one time. You have poop that is: Bloody. Black. Tarry.

## 2023-08-25 NOTE — ED Provider Notes (Signed)
 [place] EMERGENCY DEPARTMENT AT [facility] Provider Note   CSN: [id number] Arrival date & time: 08/25/23  1908     History {Add pertinent medical, surgical, social history, OB history to HPI:1} Chief Complaint  Patient presents with   Emesis    Brooke Coleman is a 73 y.o. female.   Emesis      Home Medications Prior to Admission medications   Medication Sig Start Date End Date Taking? Authorizing Provider  albuterol (VENTOLIN HFA) 108 (90 Base) MCG/ACT inhaler Inhale 2 puffs into the lungs every 6 (six) hours as needed for wheezing or shortness of breath. 06/15/20   Jarold Motto, PA  amitriptyline (ELAVIL) 25 MG tablet Take 1 tablet (25 mg total) by mouth at bedtime. 12/07/21   Willow Ora, MD  amLODipine (NORVASC) 10 MG tablet Take 1 tablet by mouth once daily 06/29/23   Willow Ora, MD  aspirin 81 MG tablet Take 81 mg by mouth daily.    [provider]  carbidopa-levodopa (SINEMET IR) 25-100 MG tablet Take 1 tablet by mouth 3 (three) times daily before meals. 09/20/22   Penumalli, Glenford Bayley, MD  EPINEPHrine 0.3 mg/0.3 mL IJ SOAJ injection Inject 0.3 mg into the muscle as needed for anaphylaxis. 06/08/21   Willow Ora, MD  ibuprofen (ADVIL) 800 MG tablet Take 1 tablet (800 mg total) by mouth every 8 (eight) hours as needed for moderate pain (with food). 02/21/23   Willow Ora, MD  levocetirizine Elita Boone ALLERGY 24HR) 5 MG tablet Take 1 tablet (5 mg total) by mouth every evening. 12/01/20   Willow Ora, MD  LORazepam (ATIVAN) 0.5 MG tablet Take 1 tablet (0.5 mg total) by mouth 2 (two) times daily as needed for anxiety. 07/19/22   Willow Ora, MD  meclizine (ANTIVERT) 12.5 MG tablet Take 1 tablet (12.5 mg total) by mouth 3 (three) times daily as needed for dizziness. 07/17/22   Gloris Manchester, MD  metoprolol succinate (TOPROL-XL) 50 MG 24 hr tablet Take 1 tablet by mouth once daily 08/22/23   Willow Ora, MD  rosuvastatin (CRESTOR) 10 MG tablet  Take 1 tablet (10 mg total) by mouth at bedtime. 02/21/23   Willow Ora, MD  sertraline (ZOLOFT) 100 MG tablet Take 1 tablet (100 mg total) by mouth daily. 12/07/21   Willow Ora, MD  tobramycin (TOBREX) 0.3 % ophthalmic solution Place 2 drops into the left eye in the morning, at noon, and at bedtime. Patient not taking: Reported on 03/28/2023 03/22/22   Willow Ora, MD  traZODone (DESYREL) 50 MG tablet Take 1 tablet (50 mg total) by mouth at bedtime. 06/08/21   Willow Ora, MD      Allergies    Cortisone, Erythromycin, Neurontin [gabapentin], Egg-derived products, Prednisone, Flexeril [cyclobenzaprine], and Naproxen    Review of Systems   Review of Systems  Gastrointestinal:  Positive for vomiting.    Physical Exam Updated Vital Signs BP 134/80   Pulse 100   Temp 98.8 F (37.1 C) (Oral)   Resp 20   SpO2 98%  Physical Exam  ED Results / Procedures / Treatments   Labs (all labs ordered are listed, but only abnormal results are displayed) Labs Reviewed  COMPREHENSIVE METABOLIC PANEL - Abnormal; Notable for the following components:      Result Value   Glucose, Bld 117 (*)    All other components within normal limits  LIPASE, BLOOD - Abnormal; Notable for the following components:  Lipase <10 (*)    All other components within normal limits  URINALYSIS, ROUTINE W REFLEX MICROSCOPIC - Abnormal; Notable for the following components:   APPearance HAZY (*)    Specific Gravity, Urine 1.033 (*)    Hgb urine dipstick MODERATE (*)    Protein, ur 30 (*)    Leukocytes,Ua MODERATE (*)    Bacteria, UA RARE (*)    All other components within normal limits  CBG MONITORING, ED - Abnormal; Notable for the following components:   Glucose-Capillary 111 (*)    All other components within normal limits  CBC WITH DIFFERENTIAL/PLATELET  LACTIC ACID, PLASMA  POC OCCULT BLOOD, ED    EKG None  Radiology CT ABDOMEN PELVIS WO CONTRAST Result Date: 08/25/2023 CLINICAL DATA:   Abdominal pain, acute, non localized, vomiting, diarrhea EXAM: CT ABDOMEN AND PELVIS WITHOUT CONTRAST TECHNIQUE: Multidetector CT imaging of the abdomen and pelvis was performed following the standard protocol without IV contrast. RADIATION DOSE REDUCTION: This exam was performed according to the departmental dose-optimization program which includes automated exposure control, adjustment of the mA and/or kV according to patient size and/or use of iterative reconstruction technique. COMPARISON:  None Available. FINDINGS: Lower chest: No acute abnormality. Hepatobiliary: Unremarkable liver. Cholelithiasis. No evidence of acute cholecystitis. No biliary dilation. Pancreas: Unremarkable. Spleen: Unremarkable. Adrenals/Urinary Tract: Normal adrenal glands. No urinary calculi or hydronephrosis. Bladder is unremarkable. Stomach/Bowel: Normal caliber large and small bowel. Descending and sigmoid colon diverticulosis. There is mild wall thickening and adjacent inflammatory stranding about the proximal sigmoid colon. The appendix is normal.Stomach is within normal limits. Vascular/Lymphatic: No significant vascular findings are present. No enlarged abdominal or pelvic lymph nodes. Reproductive: Unremarkable. Other: No abscess or free intraperitoneal air. Musculoskeletal: No acute fracture. IMPRESSION: 1. Acute uncomplicated sigmoid diverticulitis. Consider follow-up colonoscopy following resolution of patient's acute symptoms to exclude underlying mass. Electronically Signed   By: Minerva Fester M.D.   On: 08/25/2023 21:40    Procedures Procedures  {Document cardiac monitor, telemetry assessment procedure when appropriate:1}  Medications Ordered in ED Medications  famotidine (PEPCID) IVPB 20 mg premix (20 mg Intravenous New Bag/Given 08/25/23 2113)  lactated ringers bolus 1,000 mL (1,000 mLs Intravenous New Bag/Given 08/25/23 2113)    ED Course/ Medical Decision Making/ A&P                                 Medical Decision Making Amount and/or Complexity of Data Reviewed Labs: ordered. Radiology: ordered.  Risk Prescription drug management.   73 y.o. female presents to the ER for evaluation of ***. Differential diagnosis includes but is not limited to ***. Vital signs ***. Physical exam as noted above.   I independently reviewed and interpreted the patient's labs. ***.    ***We discussed the results of the labs/imaging. The plan is ***. We discussed strict return precautions and red flag symptoms. The patient verbalized their understanding and agrees to the plan. The patient is stable and being discharged home in good condition.  I discussed this case with my attending physician who cosigned this note including patient's presenting symptoms, physical exam, and planned diagnostics and interventions. Attending physician stated agreement with plan or made changes to plan which were implemented.   Portions of this report may have been transcribed using voice recognition software. Every effort was made to ensure accuracy; however, inadvertent computerized transcription errors may be present.   Final Clinical Impression(s) / ED Diagnoses Final diagnoses:  None    Rx / DC Orders ED Discharge Orders     None

## 2023-08-25 NOTE — ED Notes (Signed)
 To CT

## 2023-08-25 NOTE — ED Triage Notes (Signed)
 N/V/D since yesterday Unable to keep anything down. Did not take meds today Generalized body aches

## 2023-09-03 ENCOUNTER — Ambulatory Visit

## 2023-09-05 ENCOUNTER — Ambulatory Visit (INDEPENDENT_AMBULATORY_CARE_PROVIDER_SITE_OTHER): Payer: Medicare HMO | Admitting: Family Medicine

## 2023-09-05 ENCOUNTER — Encounter: Payer: Self-pay | Admitting: Family Medicine

## 2023-09-05 VITALS — BP 138/74 | HR 78 | Temp 97.7°F | Ht 70.0 in | Wt 246.0 lb

## 2023-09-05 DIAGNOSIS — M542 Cervicalgia: Secondary | ICD-10-CM | POA: Diagnosis not present

## 2023-09-05 DIAGNOSIS — M47812 Spondylosis without myelopathy or radiculopathy, cervical region: Secondary | ICD-10-CM

## 2023-09-05 DIAGNOSIS — G20B1 Parkinson's disease with dyskinesia, without mention of fluctuations: Secondary | ICD-10-CM | POA: Diagnosis not present

## 2023-09-05 DIAGNOSIS — E538 Deficiency of other specified B group vitamins: Secondary | ICD-10-CM | POA: Diagnosis not present

## 2023-09-05 DIAGNOSIS — G8929 Other chronic pain: Secondary | ICD-10-CM

## 2023-09-05 DIAGNOSIS — Z780 Asymptomatic menopausal state: Secondary | ICD-10-CM

## 2023-09-05 DIAGNOSIS — F341 Dysthymic disorder: Secondary | ICD-10-CM | POA: Diagnosis not present

## 2023-09-05 DIAGNOSIS — E559 Vitamin D deficiency, unspecified: Secondary | ICD-10-CM

## 2023-09-05 DIAGNOSIS — Z0000 Encounter for general adult medical examination without abnormal findings: Secondary | ICD-10-CM | POA: Diagnosis not present

## 2023-09-05 DIAGNOSIS — F5104 Psychophysiologic insomnia: Secondary | ICD-10-CM

## 2023-09-05 DIAGNOSIS — I10 Essential (primary) hypertension: Secondary | ICD-10-CM | POA: Diagnosis not present

## 2023-09-05 DIAGNOSIS — Z1231 Encounter for screening mammogram for malignant neoplasm of breast: Secondary | ICD-10-CM

## 2023-09-05 DIAGNOSIS — Z0001 Encounter for general adult medical examination with abnormal findings: Secondary | ICD-10-CM

## 2023-09-05 DIAGNOSIS — K5732 Diverticulitis of large intestine without perforation or abscess without bleeding: Secondary | ICD-10-CM

## 2023-09-05 DIAGNOSIS — E782 Mixed hyperlipidemia: Secondary | ICD-10-CM

## 2023-09-05 LAB — CBC WITH DIFFERENTIAL/PLATELET
Basophils Absolute: 0 10*3/uL (ref 0.0–0.1)
Basophils Relative: 0.3 % (ref 0.0–3.0)
Eosinophils Absolute: 0.2 10*3/uL (ref 0.0–0.7)
Eosinophils Relative: 1.7 % (ref 0.0–5.0)
HCT: 43.6 % (ref 36.0–46.0)
Hemoglobin: 14.8 g/dL (ref 12.0–15.0)
Lymphocytes Relative: 13 % (ref 12.0–46.0)
Lymphs Abs: 1.6 10*3/uL (ref 0.7–4.0)
MCHC: 34 g/dL (ref 30.0–36.0)
MCV: 92.9 fl (ref 78.0–100.0)
Monocytes Absolute: 0.8 10*3/uL (ref 0.1–1.0)
Monocytes Relative: 6.2 % (ref 3.0–12.0)
Neutro Abs: 9.8 10*3/uL — ABNORMAL HIGH (ref 1.4–7.7)
Neutrophils Relative %: 78.8 % — ABNORMAL HIGH (ref 43.0–77.0)
Platelets: 318 10*3/uL (ref 150.0–400.0)
RBC: 4.69 Mil/uL (ref 3.87–5.11)
RDW: 13.6 % (ref 11.5–15.5)
WBC: 12.4 10*3/uL — ABNORMAL HIGH (ref 4.0–10.5)

## 2023-09-05 LAB — LIPID PANEL
Cholesterol: 180 mg/dL (ref 0–200)
HDL: 41.6 mg/dL (ref >39.00)
LDL Cholesterol: 116 mg/dL — ABNORMAL HIGH (ref 0–99)
NonHDL: 138.33
Total CHOL/HDL Ratio: 4
Triglycerides: 112 mg/dL (ref 0.0–149.0)
VLDL: 22.4 mg/dL (ref 0.0–40.0)

## 2023-09-05 LAB — COMPREHENSIVE METABOLIC PANEL WITH GFR
ALT: 13 U/L (ref 0–35)
AST: 16 U/L (ref 0–37)
Albumin: 4.5 g/dL (ref 3.5–5.2)
Alkaline Phosphatase: 82 U/L (ref 39–117)
BUN: 8 mg/dL (ref 6–23)
CO2: 30 meq/L (ref 19–32)
Calcium: 9.7 mg/dL (ref 8.4–10.5)
Chloride: 104 meq/L (ref 96–112)
Creatinine, Ser: 0.75 mg/dL (ref 0.40–1.20)
GFR: 80.65 mL/min (ref >60.00)
Glucose, Bld: 101 mg/dL — ABNORMAL HIGH (ref 70–99)
Potassium: 3.9 meq/L (ref 3.5–5.1)
Sodium: 141 meq/L (ref 135–145)
Total Bilirubin: 0.8 mg/dL (ref 0.2–1.2)
Total Protein: 8.2 g/dL (ref 6.0–8.3)

## 2023-09-05 MED ORDER — AMLODIPINE BESYLATE 10 MG PO TABS: 10.0000 mg | ORAL_TABLET | Freq: Every day | ORAL | 3 refills | Status: AC

## 2023-09-05 NOTE — Patient Instructions (Addendum)
 Please return in 6 months for hypertension follow up.   I will release your lab results to you on your MyChart account with further instructions. You may see the results before I do, but when I review them I will send you a message with my report or have my assistant call you if things need to be discussed. Please reply to my message with any questions. Thank you!   If you have any questions or concerns, please don't hesitate to send me a message via MyChart or call the office at [phone]. Thank you for visiting with [place] today! It's our pleasure caring for you.   Please call [facility], Dr. Marge Duncans office to get set up for your next colonoscopy that is due in June. You can also schedule an appointment to follow up after your diverticulitis.  [phone]   Please call the office checked below to schedule your appointment for your mammogram and/or bone density screen (the checked studies were ordered): [x]   Mammogram  [x]   Bone Density  [x]   [facility]     [address] [place], [place]        [phone]         []   [facility]  [address] [place], [place]  [phone]

## 2023-09-05 NOTE — Progress Notes (Signed)
 Subjective  Chief Complaint  Patient presents with   Annual Exam    Pt here for Annual Exam and is currently fasting. Pt stated that she stopped taking her crestor bc it was making her feel funny   Hypertension    HPI: Brooke Coleman is a 73 y.o. female who presents to [facility] [place] today for a Female Wellness Visit. She also has the concerns and/or needs as listed above in the chief complaint. These will be addressed in addition to the Health Maintenance Visit.   Wellness Visit: annual visit with health maintenance review and exam  HM; overdue for mammogram and now willing to schedule. Due dexa (had nl 4 years ago) and colonoscopy in June. Sees [facility].   Chronic disease f/u and/or acute problem visit: (deemed necessary to be done in addition to the wellness visit): Recovered from acute diverticulitis: I reviewed notes. CT showing uncomplicated diverticulitis. Completed abx.  Still with some lower abdominal soreness.  Normal bowel movements.  Normal appetite Hypertension controlled with amlodipine and metoprolol.  Denies chest pain or shortness of breath. Taking Sinemet for Parkinson's.  Tremor present.  Uses cane to ambulate.  No recent falls Insomnia: No longer taking any medication.  She had been on Elavil and trazodone.  She does think Elavil was helpful.  Ran out of it. Hyperlipidemia: Stopped Crestor about 2 weeks ago.  Complains of hand pain with Crestor.  She was on it for a number of months.  Has never tried another statin. 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  No change.  No radicular symptoms at this time. Mood is stable.  Assessment  1. Encounter for well adult exam with abnormal findings   2. Essential hypertension   3. Parkinson's disease with dyskinesia, unspecified whether manifestations fluctuate (HCC)   4. Dysthymia   5. Chronic neck pain   6. Vitamin D deficiency   7. Vitamin B12 deficiency   8. Mixed hyperlipidemia   9. Sigmoid diverticulitis    10. Chronic insomnia   11. Cervical spondylosis without myelopathy   12. Screening mammogram for breast cancer   13. Asymptomatic menopausal state      Plan  Female Wellness Visit: Age appropriate Health Maintenance and Prevention measures were discussed with patient. Included topics are cancer screening recommendations, ways to keep healthy (see AVS) including dietary and exercise recommendations, regular eye and dental care, use of seat belts, and avoidance of moderate alcohol use and tobacco use.  Ordered mammogram and bone density.  Patient to call preceptor to schedule.  She will follow-up with Dr. Christel Mormon office for scheduling her colonoscopy that will be due in June. BMI: discussed patient's BMI and encouraged positive lifestyle modifications to help get to or maintain a target BMI. HM needs and immunizations were addressed and ordered. See below for orders. See HM and immunization section for updates. Routine labs and screening tests ordered including cmp, cbc and lipids where appropriate. Discussed recommendations regarding Vit D and calcium supplementation (see AVS)  Chronic disease management visit and/or acute problem visit: Assessment and Plan    Diverticulitis Improvement noted, persistent mild stomach pain. - Advise to return if symptoms worsen or do not improve.  Adverse reaction to Rosuvastatin He had myalgias related to rosuvastatin, improved post-discontinuation. Plan to switch medication. - Discontinue rosuvastatin. - Check cholesterol levels. - Prescribe an alternative cholesterol-lowering medication.  Will choose another statin to try.  Education given.  Chronic neck and arm pain Persistent severe pain, previous  evaluations negative for rotator cuff tear. - Evaluate pain management options. - Consider referral to a specialist if pain persists.  Hypertension Control is good upon recheck.  Continue metoprolol and amlodipine.  Check renal function  electrolytes  Insomnia Difficulty sleeping, amitriptyline previously helpful. - Refill amitriptyline for sleep.  Elavil 25 nightly  Parkinson's Disease Managed with Sinemet. - Continue Sinemet.  General Health Maintenance Due for mammogram and colonoscopy. - Advise to schedule a mammogram. - Arrange for a colonoscopy referral.  Follow-up Follow up in six months unless earlier visit needed. - Schedule follow-up appointment in six months. - Advise to return sooner if needed. - Send note after reviewing lab work.       Orders Placed This Encounter  Procedures   MM DIGITAL SCREENING BILATERAL   DG Bone Density   TSH   VITAMIN D 25 Hydroxy (Vit-D Deficiency, Fractures)   CBC with Differential/Platelet   Comprehensive metabolic panel   Lipid panel   Vitamin B12   Meds ordered this encounter  Medications   amLODipine (NORVASC) 10 MG tablet    Sig: Take 1 tablet (10 mg total) by mouth daily.    Dispense:  90 tablet    Refill:  3      Body mass index is 35.3 kg/m. Wt Readings from Last 3 Encounters:  09/05/23 246 lb (111.6 kg)  03/28/23 244 lb 9.6 oz (110.9 kg)  02/21/23 243 lb 3.2 oz (110.3 kg)     Patient Active Problem List   Diagnosis Date Noted Date Diagnosed   Parkinson disease (HCC) 09/21/2022     Priority: High    Reevaluated 09/2022: + masked facies, resting and postural tremors, cogwheeling and bradykinesia; started sinemet. Dr. Marjory Lies, [place] [facility]  Essential vs parkinsons's: eval 2021 neuro Referred for reevaluation; worsening, 05/2021, [facility]    Chronic neck pain 01/14/2020     Priority: High    Evaluated by ortho and NS: offered surgery but not clearly going to be helpful. Pt defers. Dr. Alphonzo Lemmings    Chronic insomnia 01/24/2017     Priority: High   Essential hypertension 11/07/2016     Priority: High   Dysthymia 11/07/2016     Priority: High   Cervical spondylosis without myelopathy 06/04/2015     Priority: High   Primary osteoarthritis of  both hands 01/14/2020     Priority: Medium    Primary osteoarthritis of both wrists 01/14/2020     Priority: Medium    Mild intermittent asthma 01/14/2020     Priority: Medium    Gastroesophageal reflux disease without esophagitis 11/07/2016     Priority: Medium    Bilateral carpal tunnel syndrome 06/04/2015     Priority: Medium    Vitamin D deficiency 01/14/2020     Priority: Low   Vitamin B12 deficiency 01/14/2020     Priority: Low   Drusen of macula, bilateral 01/03/2018     Priority: Low   Mixed hyperlipidemia 09/05/2023    Sigmoid diverticulitis 09/05/2023    History of anaphylaxis 06/08/2021     Medications: has epi pen    Health Maintenance  Topic Date Due   DTaP/Tdap/Td (1 - Tdap) Never done   MAMMOGRAM  10/10/2021   Medicare Annual Wellness (AWV)  08/15/2023   Colonoscopy  11/11/2023   COVID-19 Vaccine (4 - 2024-25 season) 09/21/2023 (Originally 02/11/2023)   DEXA SCAN  04/27/2025   Pneumonia Vaccine 68+ Years old  Completed   Hepatitis C Screening  Completed   HPV VACCINES  Aged  Out   INFLUENZA VACCINE  Discontinued   Zoster Vaccines- Shingrix  Discontinued   Immunization History  Administered Date(s) Administered   PFIZER Comirnaty(Gray Top)Covid-19 Tri-Sucrose Vaccine 08/18/2020   PFIZER(Purple Top)SARS-COV-2 Vaccination 10/15/2019, 11/05/2019   PNEUMOCOCCAL CONJUGATE-20 06/08/2021   We updated and reviewed the patient's past history in detail and it is documented below. Allergies: Patient is allergic to cortisone, erythromycin, neurontin [gabapentin], crestor [rosuvastatin], egg-derived products, prednisone, flexeril [cyclobenzaprine], and naproxen. Past Medical History Patient  has a past medical history of Cervical spondylosis, Chronic neck pain (01/14/2020), Chronic pain, Depression, Heart murmur, History of posterior vitreous detachment (01/03/2018), Hyperlipidemia, Hypertension, Mild intermittent asthma (01/14/2020), and Tremors of nervous system. Past  Surgical History Patient  has no past surgical history on file. Family History: Patient family history includes Cancer - Lung in her brother; Heart disease in her brother and father; Hypertension in her father; Lung disease in her brother and sister; Pulmonary embolism in her mother; Syncope episode in her sister. Social History:  Patient  reports that she quit smoking about 18 years ago. Her smoking use included cigarettes. She has never used smokeless tobacco. She reports that she does not drink alcohol and does not use drugs.  Review of Systems: Constitutional: negative for fever or malaise Ophthalmic: negative for photophobia, double vision or loss of vision Cardiovascular: negative for chest pain, dyspnea on exertion, or new LE swelling Respiratory: negative for SOB or persistent cough Gastrointestinal: negative for abdominal pain, change in bowel habits or melena Genitourinary: negative for dysuria or gross hematuria, no abnormal uterine bleeding or disharge Musculoskeletal: negative for new gait disturbance or muscular weakness Integumentary: negative for new or persistent rashes, no breast lumps Neurological: negative for TIA or stroke symptoms Psychiatric: negative for SI or delusions Allergic/Immunologic: negative for hives  Patient Care Team    Relationship Specialty Notifications Start End  Willow Ora, MD PCP - General Family Medicine  01/14/20   Eldred Manges, MD Consulting Physician Orthopedic Surgery  07/27/15   Coletta Memos, MD Consulting Physician Neurosurgery  12/12/17   Dimitri Ped, MD Consulting Physician Surgery  01/03/18   Suanne Marker, MD Consulting Physician Neurology  09/21/22   Charlott Rakes, MD Consulting Physician Gastroenterology  09/05/23     Objective  Vitals: BP 138/74 (BP Location: Right Arm, Patient Position: Supine, Cuff Size: Large) Comment: cla  Pulse 78   Temp 97.7 F (36.5 C)   Ht 5\' 10"  (1.778 m)   Wt 246 lb (111.6 kg)    SpO2 95%   BMI 35.30 kg/m  General:  Well developed, well nourished, no acute distress  Psych:  Alert and orientedx3,normal mood and affect HEENT:  Normocephalic, atraumatic, non-icteric sclera,  supple neck without adenopathy, mass or thyromegaly Cardiovascular:  Normal S1, S2, RRR without gallop, rub or murmur Respiratory:  Good breath sounds bilaterally, CTAB with normal respiratory effort Gastrointestinal: normal bowel sounds, soft, minimal tenderness left lower quadrant, no noted masses. No HSM MSK: extremities without edema, joints without erythema or swelling Neurologic:    Mental status is normal.  Bilateral hand tremors at rest, unsteady gait Commons side effects, risks, benefits, and alternatives for medications and treatment plan prescribed today were discussed, and the patient expressed understanding of the given instructions. Patient is instructed to call or message via MyChart if he/she has any questions or concerns regarding our treatment plan. No barriers to understanding were identified. We discussed Red Flag symptoms and signs in detail. Patient expressed understanding regarding what to  do in case of urgent or emergency type symptoms.  Medication list was reconciled, printed and provided to the patient in AVS. Patient instructions and summary information was reviewed with the patient as documented in the AVS. This note was prepared with assistance of Dragon voice recognition software. Occasional wrong-word or sound-a-like substitutions may have occurred due to the inherent limitations of voice recognition software

## 2023-09-06 ENCOUNTER — Emergency Department (HOSPITAL_BASED_OUTPATIENT_CLINIC_OR_DEPARTMENT_OTHER)

## 2023-09-06 ENCOUNTER — Encounter (HOSPITAL_BASED_OUTPATIENT_CLINIC_OR_DEPARTMENT_OTHER): Payer: Self-pay | Admitting: Emergency Medicine

## 2023-09-06 ENCOUNTER — Inpatient Hospital Stay (HOSPITAL_BASED_OUTPATIENT_CLINIC_OR_DEPARTMENT_OTHER)
Admission: EM | Admit: 2023-09-06 | Discharge: 2023-09-11 | DRG: 372 | Disposition: A | Discharge status: Home / Self Care | Attending: Internal Medicine | Admitting: Internal Medicine

## 2023-09-06 ENCOUNTER — Other Ambulatory Visit: Payer: Self-pay

## 2023-09-06 DIAGNOSIS — R1084 Generalized abdominal pain: Principal | ICD-10-CM

## 2023-09-06 DIAGNOSIS — Z1152 Encounter for screening for COVID-19: Secondary | ICD-10-CM

## 2023-09-06 DIAGNOSIS — A0472 Enterocolitis due to Clostridium difficile, not specified as recurrent: Secondary | ICD-10-CM | POA: Diagnosis not present

## 2023-09-06 DIAGNOSIS — Z91041 Radiographic dye allergy status: Secondary | ICD-10-CM

## 2023-09-06 DIAGNOSIS — Z8249 Family history of ischemic heart disease and other diseases of the circulatory system: Secondary | ICD-10-CM

## 2023-09-06 DIAGNOSIS — E876 Hypokalemia: Secondary | ICD-10-CM

## 2023-09-06 DIAGNOSIS — E782 Mixed hyperlipidemia: Secondary | ICD-10-CM | POA: Diagnosis present

## 2023-09-06 DIAGNOSIS — J4520 Mild intermittent asthma, uncomplicated: Secondary | ICD-10-CM | POA: Diagnosis present

## 2023-09-06 DIAGNOSIS — Z7982 Long term (current) use of aspirin: Secondary | ICD-10-CM

## 2023-09-06 DIAGNOSIS — K760 Fatty (change of) liver, not elsewhere classified: Secondary | ICD-10-CM | POA: Diagnosis present

## 2023-09-06 DIAGNOSIS — F419 Anxiety disorder, unspecified: Secondary | ICD-10-CM | POA: Diagnosis present

## 2023-09-06 DIAGNOSIS — Z79899 Other long term (current) drug therapy: Secondary | ICD-10-CM

## 2023-09-06 DIAGNOSIS — Z888 Allergy status to other drugs, medicaments and biological substances status: Secondary | ICD-10-CM

## 2023-09-06 DIAGNOSIS — G20A1 Parkinson's disease without dyskinesia, without mention of fluctuations: Secondary | ICD-10-CM | POA: Diagnosis present

## 2023-09-06 DIAGNOSIS — Z91012 Allergy to eggs: Secondary | ICD-10-CM

## 2023-09-06 DIAGNOSIS — Z881 Allergy status to other antibiotic agents status: Secondary | ICD-10-CM

## 2023-09-06 DIAGNOSIS — Z87891 Personal history of nicotine dependence: Secondary | ICD-10-CM

## 2023-09-06 DIAGNOSIS — I10 Essential (primary) hypertension: Secondary | ICD-10-CM | POA: Diagnosis present

## 2023-09-06 DIAGNOSIS — K5792 Diverticulitis of intestine, part unspecified, without perforation or abscess without bleeding: Secondary | ICD-10-CM | POA: Diagnosis present

## 2023-09-06 DIAGNOSIS — K5732 Diverticulitis of large intestine without perforation or abscess without bleeding: Principal | ICD-10-CM | POA: Diagnosis present

## 2023-09-06 DIAGNOSIS — F32A Depression, unspecified: Secondary | ICD-10-CM | POA: Diagnosis present

## 2023-09-06 DIAGNOSIS — A0811 Acute gastroenteropathy due to Norwalk agent: Secondary | ICD-10-CM

## 2023-09-06 DIAGNOSIS — F341 Dysthymic disorder: Secondary | ICD-10-CM | POA: Diagnosis present

## 2023-09-06 LAB — CBC WITH DIFFERENTIAL/PLATELET
Abs Immature Granulocytes: 0.06 10*3/uL (ref 0.00–0.07)
Basophils Absolute: 0 10*3/uL (ref 0.0–0.1)
Basophils Relative: 0 %
Eosinophils Absolute: 0 10*3/uL (ref 0.0–0.5)
Eosinophils Relative: 0 %
HCT: 38.9 % (ref 36.0–46.0)
Hemoglobin: 13.3 g/dL (ref 12.0–15.0)
Immature Granulocytes: 0 %
Lymphocytes Relative: 8 %
Lymphs Abs: 1.3 10*3/uL (ref 0.7–4.0)
MCH: 31.4 pg (ref 26.0–34.0)
MCHC: 34.2 g/dL (ref 30.0–36.0)
MCV: 91.7 fL (ref 80.0–100.0)
Monocytes Absolute: 1.3 10*3/uL — ABNORMAL HIGH (ref 0.1–1.0)
Monocytes Relative: 8 %
Neutro Abs: 14.1 10*3/uL — ABNORMAL HIGH (ref 1.7–7.7)
Neutrophils Relative %: 84 %
Platelets: 273 10*3/uL (ref 150–400)
RBC: 4.24 MIL/uL (ref 3.87–5.11)
RDW: 13.4 % (ref 11.5–15.5)
WBC: 16.8 10*3/uL — ABNORMAL HIGH (ref 4.0–10.5)
nRBC: 0 % (ref 0.0–0.2)

## 2023-09-06 LAB — COMPREHENSIVE METABOLIC PANEL WITH GFR
ALT: 12 U/L (ref 0–44)
AST: 15 U/L (ref 15–41)
Albumin: 4 g/dL (ref 3.5–5.0)
Alkaline Phosphatase: 66 U/L (ref 38–126)
Anion gap: 10 (ref 5–15)
BUN: 10 mg/dL (ref 8–23)
CO2: 23 mmol/L (ref 22–32)
Calcium: 8.9 mg/dL (ref 8.9–10.3)
Chloride: 103 mmol/L (ref 98–111)
Creatinine, Ser: 0.83 mg/dL (ref 0.44–1.00)
GFR, Estimated: >60 mL/min (ref >60)
Glucose, Bld: 132 mg/dL — ABNORMAL HIGH (ref 70–99)
Potassium: 3.2 mmol/L — ABNORMAL LOW (ref 3.5–5.1)
Sodium: 136 mmol/L (ref 135–145)
Total Bilirubin: 1.4 mg/dL — ABNORMAL HIGH (ref 0.0–1.2)
Total Protein: 7.1 g/dL (ref 6.5–8.1)

## 2023-09-06 LAB — TROPONIN I (HIGH SENSITIVITY): Troponin I (High Sensitivity): 4 ng/L (ref <18)

## 2023-09-06 LAB — RESP PANEL BY RT-PCR (RSV, FLU A&B, COVID)  RVPGX2
Influenza A by PCR: NEGATIVE
Influenza B by PCR: NEGATIVE
Resp Syncytial Virus by PCR: NEGATIVE
SARS Coronavirus 2 by RT PCR: NEGATIVE

## 2023-09-06 LAB — LACTIC ACID, PLASMA: Lactic Acid, Venous: 1 mmol/L (ref 0.5–1.9)

## 2023-09-06 LAB — TSH: TSH: 1.75 u[IU]/mL (ref 0.35–5.50)

## 2023-09-06 LAB — LIPASE, BLOOD: Lipase: <10 U/L — ABNORMAL LOW (ref 11–51)

## 2023-09-06 MED ORDER — ONDANSETRON HCL 4 MG/2ML IJ SOLN
4.0000 mg | Freq: Four times a day (QID) | INTRAMUSCULAR | Status: DC | PRN
  Administered 2023-09-06 – 2023-09-10 (×4): 4 mg via INTRAVENOUS
  Filled 2023-09-06 (×4): qty 2

## 2023-09-06 MED ORDER — METOPROLOL SUCCINATE ER 50 MG PO TB24
50.0000 mg | ORAL_TABLET | Freq: Every day | ORAL | Status: DC
  Administered 2023-09-07 – 2023-09-11 (×5): 50 mg via ORAL
  Filled 2023-09-06 (×5): qty 1

## 2023-09-06 MED ORDER — SENNOSIDES-DOCUSATE SODIUM 8.6-50 MG PO TABS: 1.0000 | ORAL_TABLET | Freq: Every evening | ORAL | Status: DC | PRN

## 2023-09-06 MED ORDER — METRONIDAZOLE 500 MG/100ML IV SOLN
500.0000 mg | Freq: Two times a day (BID) | INTRAVENOUS | Status: DC
  Administered 2023-09-06 – 2023-09-11 (×10): 500 mg via INTRAVENOUS
  Filled 2023-09-06 (×10): qty 100

## 2023-09-06 MED ORDER — POTASSIUM CHLORIDE 20 MEQ PO PACK
40.0000 meq | PACK | Freq: Once | ORAL | Status: AC
  Administered 2023-09-06: 40 meq via ORAL
  Filled 2023-09-06: qty 2

## 2023-09-06 MED ORDER — ALBUTEROL SULFATE (2.5 MG/3ML) 0.083% IN NEBU: 3.0000 mL | INHALATION_SOLUTION | Freq: Four times a day (QID) | RESPIRATORY_TRACT | Status: DC | PRN

## 2023-09-06 MED ORDER — MORPHINE SULFATE (PF) 4 MG/ML IV SOLN
4.0000 mg | INTRAVENOUS | Status: DC | PRN
  Administered 2023-09-06 (×2): 4 mg via INTRAVENOUS
  Filled 2023-09-06 (×2): qty 1

## 2023-09-06 MED ORDER — LACTATED RINGERS IV SOLN: INTRAVENOUS | Status: DC

## 2023-09-06 MED ORDER — MORPHINE SULFATE (PF) 2 MG/ML IV SOLN
2.0000 mg | INTRAVENOUS | Status: DC | PRN
  Administered 2023-09-06 – 2023-09-07 (×2): 2 mg via INTRAVENOUS
  Filled 2023-09-06 (×2): qty 1

## 2023-09-06 MED ORDER — ENOXAPARIN SODIUM 40 MG/0.4ML IJ SOSY
40.0000 mg | PREFILLED_SYRINGE | INTRAMUSCULAR | Status: DC
  Administered 2023-09-06 – 2023-09-10 (×5): 40 mg via SUBCUTANEOUS
  Filled 2023-09-06 (×5): qty 0.4

## 2023-09-06 MED ORDER — ONDANSETRON HCL 4 MG PO TABS
4.0000 mg | ORAL_TABLET | Freq: Four times a day (QID) | ORAL | Status: DC | PRN
Start: 2023-09-06 — End: 2023-09-11

## 2023-09-06 MED ORDER — ACETAMINOPHEN 650 MG RE SUPP: 650.0000 mg | Freq: Four times a day (QID) | RECTAL | Status: DC | PRN

## 2023-09-06 MED ORDER — SODIUM CHLORIDE 0.9 % IV SOLN
2.0000 g | INTRAVENOUS | Status: DC
  Administered 2023-09-06 – 2023-09-10 (×5): 2 g via INTRAVENOUS
  Filled 2023-09-06 (×5): qty 20

## 2023-09-06 MED ORDER — AMLODIPINE BESYLATE 10 MG PO TABS
10.0000 mg | ORAL_TABLET | Freq: Every day | ORAL | Status: DC
  Administered 2023-09-07 – 2023-09-11 (×5): 10 mg via ORAL
  Filled 2023-09-06 (×5): qty 1

## 2023-09-06 MED ORDER — ONDANSETRON HCL 4 MG/2ML IJ SOLN
4.0000 mg | Freq: Once | INTRAMUSCULAR | Status: AC
  Administered 2023-09-06: 4 mg via INTRAVENOUS
  Filled 2023-09-06: qty 2

## 2023-09-06 MED ORDER — ACETAMINOPHEN 325 MG PO TABS: 650.0000 mg | ORAL_TABLET | Freq: Four times a day (QID) | ORAL | Status: DC | PRN

## 2023-09-06 MED ORDER — LACTATED RINGERS IV BOLUS
1000.0000 mL | Freq: Once | INTRAVENOUS | Status: AC
  Administered 2023-09-06: 1000 mL via INTRAVENOUS

## 2023-09-06 MED ORDER — PIPERACILLIN-TAZOBACTAM 3.375 G IVPB 30 MIN
3.3750 g | Freq: Once | INTRAVENOUS | Status: AC
  Administered 2023-09-06: 3.375 g via INTRAVENOUS
  Filled 2023-09-06: qty 50

## 2023-09-06 NOTE — H&P (Signed)
 History and Physical    Brooke Coleman ZOX:[id number] DOB: 18-Feb-1951 DOA: 09/06/2023  PCP: Willow Ora, MD  Patient coming from: Home  I have personally briefly reviewed patient's old medical records in [facility]  Chief Complaint: Abdominal pain  HPI: Brooke Coleman is a 73 y.o. female with medical history significant for HTN, HLD, depression/anxiety, parkinsonism who presented to the ED for evaluation of abdominal pain.  Patient was initially seen in the ED on 3/15 with LLQ abdominal pain and found to have acute uncomplicated sigmoid diverticulitis which was seen on CT.  She was started on a course of Augmentin with initial improvement however yesterday developed intense LLQ abdominal pain again.  This has been associated with frequent diarrhea, nausea, and inability maintain adequate oral intake.  She says that she felt like she "got shot in the stomach" and came to the ED for further evaluation due to her significant pain.  [facility] ED Course  Labs/Imaging on admission: I have personally reviewed following labs and imaging studies.  Initial vitals showed BP 134/94, pulse 105, RR 18, temp 99.0 F, SpO2 96% on room air.  Labs show WBC 16.8, hemoglobin 13.3, platelets 273,000, sodium 136, potassium 3.2, bicarb 23, BUN 10, creatinine 0.83, serum glucose 132, AST 15, ALT 12, alk phos 66, total bilirubin 1.4, lipase <10, troponin 4, lactic acid 1.0.  SARS-CoV-2, influenza, RSV PCR negative.  Blood cultures, GI and C. difficile panels pending.  CT abdomen/pelvis without contrast showed findings consistent with diverticulitis involving the distal descending and proximal sigmoid colon, hepatic steatosis, large solitary gallstone.  Patient was given 1 L LR, IV Zosyn, IV morphine.  The hospitalist service was consulted to admit for further evaluation and management.  Review of Systems: All systems reviewed and are negative except as documented in history of present  illness above.   Past Medical History:  Diagnosis Date   Cervical spondylosis    Chronic neck pain 01/14/2020   Evaluated by ortho and NS: offered surgery but not clearly going to be helpful. Pt defers.    Chronic pain    Depression    Heart murmur    History of posterior vitreous detachment 01/03/2018   Hyperlipidemia    Hypertension    Mild intermittent asthma 01/14/2020   Tremors of nervous system     History reviewed. No pertinent surgical history.  Social History: Social History   Tobacco Use   Smoking status: Former    Current packs/day: 0.00    Types: Cigarettes    Quit date: 06/12/2005    Years since quitting: 18.2   Smokeless tobacco: Never  Substance Use Topics   Alcohol use: No   Drug use: No   Allergies  Allergen Reactions   Cortisone Anaphylaxis   Erythromycin Anaphylaxis   Neurontin [Gabapentin] Swelling   Crestor [Rosuvastatin] Other (See Comments)    Made her feel funny; declines further statins   Egg-Derived Products    Iodinated Contrast Media Nausea And Vomiting    (Can do dye-less)   Prednisone Other (See Comments)    Pinching in chest   Flexeril [Cyclobenzaprine]     "pinching in chest"   Naproxen     "pinching in chest"    Family History  Problem Relation Age of Onset   Pulmonary embolism Mother    Hypertension Father    Heart disease Father    Lung disease Sister    Syncope episode Sister    Lung disease Brother    Heart  disease Brother    Cancer - Lung Brother      Prior to Admission medications   Medication Sig Start Date End Date Taking? Authorizing Provider  albuterol (VENTOLIN HFA) 108 (90 Base) MCG/ACT inhaler Inhale 2 puffs into the lungs every 6 (six) hours as needed for wheezing or shortness of breath. 06/15/20  Yes Jarold Motto, PA  amLODipine (NORVASC) 10 MG tablet Take 1 tablet (10 mg total) by mouth daily. 09/05/23  Yes Willow Ora, MD  aspirin 81 MG tablet Take 81 mg by mouth daily.   Yes [provider]   ibuprofen (ADVIL) 800 MG tablet Take 1 tablet (800 mg total) by mouth every 8 (eight) hours as needed for moderate pain (with food). 02/21/23  Yes Willow Ora, MD  levocetirizine Elita Boone ALLERGY 24HR) 5 MG tablet Take 1 tablet (5 mg total) by mouth every evening. 12/01/20  Yes Willow Ora, MD  metoprolol succinate (TOPROL-XL) 50 MG 24 hr tablet Take 1 tablet by mouth once daily 08/22/23  Yes Willow Ora, MD  amitriptyline (ELAVIL) 25 MG tablet Take 1 tablet (25 mg total) by mouth at bedtime. Patient not taking: Reported on 09/06/2023 12/07/21   Willow Ora, MD  carbidopa-levodopa (SINEMET IR) 25-100 MG tablet Take 1 tablet by mouth 3 (three) times daily before meals. Patient not taking: Reported on 09/06/2023 09/20/22   Penumalli, Glenford Bayley, MD  EPINEPHrine 0.3 mg/0.3 mL IJ SOAJ injection Inject 0.3 mg into the muscle as needed for anaphylaxis. Patient not taking: Reported on 09/05/2023 06/08/21   Willow Ora, MD  sertraline (ZOLOFT) 100 MG tablet Take 1 tablet (100 mg total) by mouth daily. Patient not taking: Reported on 09/05/2023 12/07/21   Willow Ora, MD    Physical Exam: Vitals:   09/06/23 1815 09/06/23 1901 09/06/23 2016 09/06/23 2032  BP: 133/80 (!) 146/78 128/79   Pulse: 94 96 100   Resp: (!) 22 (!) 29 16   Temp:  98.8 F (37.1 C) 98.5 F (36.9 C)   TempSrc:  Oral Oral   SpO2: 96% 97% 97%   Weight:    113.6 kg  Height:    5\' 10"  (1.778 m)   Constitutional: Resting in bed, appears fatigued but in no acute distress Eyes: EOMI, lids and conjunctivae normal ENMT: Mucous membranes are dry. Posterior pharynx clear of any exudate or lesions.Normal dentition.  Neck: normal, supple, no masses. Respiratory: clear to auscultation bilaterally, no wheezing, no crackles. Normal respiratory effort. No accessory muscle use.  Cardiovascular: Regular rate and rhythm, no murmurs / rubs / gallops. No extremity edema. 2+ pedal pulses. Abdomen: no tenderness, no masses  palpated. Musculoskeletal: no clubbing / cyanosis. No joint deformity upper and lower extremities. Good ROM, no contractures. Normal muscle tone.  Skin: no rashes, lesions, ulcers. No induration Neurologic: Sensation intact. Strength 5/5 in all 4.  Psychiatric: Resting tremor bilateral upper extremities and jaw.  Alert and oriented x 3. Normal mood.   EKG: Personally reviewed. Limited by motion artifact, appears to be sinus rhythm rate approximately 70.  Assessment/Plan Principal Problem:   Diverticulitis of large intestine without perforation or abscess without bleeding Active Problems:   Essential hypertension   Parkinson disease (HCC)   Mixed hyperlipidemia   Hypokalemia   Brooke Coleman is a 74 y.o. female with medical history significant for HTN, HLD, depression/anxiety, parkinsonism who is admitted with distal descending and proximal sigmoid colon diverticulitis failing outpatient management.  Assessment and Plan: Diverticulitis of distal  descending and proximal sigmoid colon: Admitted after failing outpatient antibiotic treatment with new leukocytosis.  No evidence of perforation or associated abscess on CT. -Continue ceftriaxone and Flagyl -Continue IV fluid hydration overnight -Continue antiemetics and analgesics as needed -Advance diet as tolerated  Hypokalemia: Supplementing.  Hypertension: Continue Toprol-XL and amlodipine.  Hyperlipidemia: Previously on rosuvastatin which was discontinued due to associated myalgias.  Not currently on statin.  Parkinsonism: She is prescribed Sinemet IR by her neurologist but she says she has not been taking it.   DVT prophylaxis: enoxaparin (LOVENOX) injection 40 mg Start: 09/06/23 2200 Code Status: Full code, confirmed with patient on admission Family Communication: Discussed with patient, she has discussed with family Disposition Plan: From home and likely discharge to home pending clinical progress Consults called:  None Severity of Illness: The appropriate patient status for this patient is OBSERVATION. Observation status is judged to be reasonable and necessary in order to provide the required intensity of service to ensure the patient's safety. The patient's presenting symptoms, physical exam findings, and initial radiographic and laboratory data in the context of their medical condition is felt to place them at decreased risk for further clinical deterioration. Furthermore, it is anticipated that the patient will be medically stable for discharge from the hospital within 2 midnights of admission.   Darreld Mclean MD [facility]  If 7PM-7AM, please contact night-coverage [email or web address]  09/06/2023, 8:51 PM

## 2023-09-06 NOTE — Progress Notes (Signed)
 Plan of Care Note for accepted transfer   Patient: Brooke Coleman MRN: [id number]   DOA: 09/06/2023  Facility requesting transfer: [facility] Requesting Provider: Dr. Doran Durand Reason for transfer: Diverticulitis Facility course:  Patient is a 73 year old female with medical history significant for HTN, HLD, parkinsonism who was initially seen in the ED on 3/15 with LLQ abdominal pain and found to have acute uncomplicated sigmoid diverticulitis.  She was started on a course of Augmentin with initial improvement however yesterday developed intense LLQ abdominal pain again associated with frequent diarrhea, nausea, and inability maintain adequate oral intake.  Labs notable for WBC 16.8, potassium 3.2, total bilirubin 1.4.  SARS-CoV-2, influenza, RSV PCR negative.  Blood cultures, GI pathogen, and C. difficile panels ordered and pending collection.  Repeat CT abdomen/pelvis showed findings consistent with diverticulitis involving the distal descending and proximal sigmoid colon, hepatic steatosis, large solitary gallstone.  Patient was given 1 L LR, IV Zosyn, IV morphine.  The hospitalist service was consulted to admit.  Plan of care: The patient is accepted for admission to Med-surg  unit, at [facility] or [facility], first available.  Author: Darreld Mclean, MD 09/06/2023  Check [email or web address] for on-call coverage.  Nursing staff, Please call [facility] Admits & Consults System-Wide number on Amion as soon as patient's arrival, so appropriate admitting provider can evaluate the pt.

## 2023-09-06 NOTE — ED Provider Notes (Signed)
 [place] EMERGENCY DEPARTMENT AT [facility] Provider Note   CSN: [id number] Arrival date & time: 09/06/23  1445     History Chief Complaint  Patient presents with   Abdominal Pain    HPI Brooke Coleman is a 73 y.o. female presenting for severe abdominal pain.  73 year old female with a relatively extensive medical history including hypertension, asthma, Parkinson disease, hyperlipidemia. Was seen last with diverticulitis diagnosis. Started on antibiotics.  Had been improving until yesterday where she suddenly decompensated over the course of approximately 16 hours.  She had development of severe nausea, severe abdominal pain, bloating and intolerance p.o. intake. Comes in today for further care and management.  Endorses subjective fevers for which she has been taking Advil, feels very malaised. Marland Kitchen  PMH chronic pain, HTN, asthma, PD, HLD.  Patient's recorded medical, surgical, social, medication list and allergies were reviewed in the Snapshot window as part of the initial history.   Review of Systems   Review of Systems  Constitutional:  Positive for fatigue. Negative for chills and fever.  HENT:  Negative for ear pain and sore throat.   Eyes:  Negative for pain and visual disturbance.  Respiratory:  Negative for cough and shortness of breath.   Cardiovascular:  Negative for chest pain and palpitations.  Gastrointestinal:  Positive for abdominal distention, abdominal pain, nausea and vomiting.  Genitourinary:  Negative for dysuria and hematuria.  Musculoskeletal:  Negative for arthralgias and back pain.  Skin:  Negative for color change and rash.  Neurological:  Positive for weakness. Negative for seizures and syncope.  All other systems reviewed and are negative.   Physical Exam Updated Vital Signs BP 128/79 (BP Location: Left Arm)   Pulse 100   Temp 98.5 F (36.9 C) (Oral)   Resp 16   Ht 5\' 10"  (1.778 m)   Wt 113.6 kg   SpO2 97%   BMI 35.93 kg/m   Physical Exam Vitals and nursing note reviewed.  Constitutional:      General: She is not in acute distress.    Appearance: She is well-developed.  HENT:     Head: Normocephalic and atraumatic.  Eyes:     Conjunctiva/sclera: Conjunctivae normal.  Cardiovascular:     Rate and Rhythm: Normal rate and regular rhythm.     Heart sounds: No murmur heard. Pulmonary:     Effort: Pulmonary effort is normal. No respiratory distress.     Breath sounds: Normal breath sounds.  Abdominal:     General: There is distension.     Palpations: Abdomen is soft.     Tenderness: There is abdominal tenderness. There is guarding. There is no right CVA tenderness or left CVA tenderness.  Musculoskeletal:        General: No swelling or tenderness. Normal range of motion.     Cervical back: Neck supple.  Skin:    General: Skin is warm and dry.  Neurological:     General: No focal deficit present.     Mental Status: She is alert and oriented to person, place, and time. Mental status is at baseline.     Cranial Nerves: No cranial nerve deficit.      ED Course/ Medical Decision Making/ A&P    Procedures Procedures   Medications Ordered in ED Medications  morphine (PF) 2 MG/ML injection 2 mg (2 mg Intravenous Given 09/06/23 2302)  cefTRIAXone (ROCEPHIN) 2 g in sodium chloride 0.9 % 100 mL IVPB (2 g Intravenous New Bag/Given 09/06/23 2239)  metroNIDAZOLE (  FLAGYL) IVPB 500 mg (has no administration in time range)  enoxaparin (LOVENOX) injection 40 mg (40 mg Subcutaneous Given 09/06/23 2241)  potassium chloride (KLOR-CON) packet 40 mEq (has no administration in time range)  lactated ringers infusion (has no administration in time range)  acetaminophen (TYLENOL) tablet 650 mg (has no administration in time range)    Or  acetaminophen (TYLENOL) suppository 650 mg (has no administration in time range)  ondansetron (ZOFRAN) tablet 4 mg ( Oral See Alternative 09/06/23 2255)    Or  ondansetron (ZOFRAN)  injection 4 mg (4 mg Intravenous Given 09/06/23 2255)  senna-docusate (Senokot-S) tablet 1 tablet (has no administration in time range)  albuterol (PROVENTIL) (2.5 MG/3ML) 0.083% nebulizer solution 3 mL (has no administration in time range)  amLODipine (NORVASC) tablet 10 mg (has no administration in time range)  metoprolol succinate (TOPROL-XL) 24 hr tablet 50 mg (has no administration in time range)  lactated ringers bolus 1,000 mL (0 mLs Intravenous Stopped 09/06/23 1811)  ondansetron (ZOFRAN) injection 4 mg (4 mg Intravenous Given 09/06/23 1705)  piperacillin-tazobactam (ZOSYN) IVPB 3.375 g (0 g Intravenous Stopped 09/06/23 1838)   Medical Decision Making:   Brooke Coleman is a 73 y.o. female who presented to the ED today with abdominal pain, detailed above.    Additional history discussed with patient's family/caregivers.  Patient placed on continuous vitals and telemetry monitoring while in ED which was reviewed periodically.  Complete initial physical exam performed, notably the patient  was tachycardic, with severe abdominal pain with guarding.     Reviewed and confirmed nursing documentation for past medical history, family history, social history.    Initial Assessment:   With the patient's presentation of abdominal pain, most likely diagnosis is progression of her diverticulitis versus other nonspecific etiology. Other diagnoses were considered including (but not limited to) gastroenteritis, colitis, small bowel obstruction, appendicitis, cholecystitis, pancreatitis, nephrolithiasis, UTI, pyleonephritis. These are considered less likely due to history of present illness and physical exam findings.   This is most consistent with an acute life/limb threatening illness complicated by underlying chronic conditions.   Initial Plan:  CBC/CMP to evaluate for underlying infectious/metabolic etiology for patient's abdominal pain  Lipase to evaluate for pancreatitis  EKG to evaluate for cardiac  source of pain  CTAB/Pelvis with contrast to evaluate for structural/surgical etiology of patients' severe abdominal pain.  Urinalysis and repeat physical assessment to evaluate for UTI/Pyelonpehritis  Empiric management of symptoms with escalating pain control and antiemetics as needed.   Initial Study Results:   Laboratory  All laboratory results reviewed without evidence of clinically relevant pathology.   Exceptions include: Leukocytosis   EKG EKG was reviewed independently. Rate, rhythm, axis, intervals all examined and without medically relevant abnormality. ST segments without concerns for elevations.    Radiology All images reviewed independently. Agree with radiology report at this time.   CT ABDOMEN PELVIS WO CONTRAST Result Date: 09/06/2023 CLINICAL DATA:  Acute left lower quadrant abdominal pain. History of sigmoid diverticulitis. EXAM: CT ABDOMEN AND PELVIS WITHOUT CONTRAST TECHNIQUE: Multidetector CT imaging of the abdomen and pelvis was performed following the standard protocol without IV contrast. RADIATION DOSE REDUCTION: This exam was performed according to the departmental dose-optimization program which includes automated exposure control, adjustment of the mA and/or kV according to patient size and/or use of iterative reconstruction technique. COMPARISON:  August 25, 2023. FINDINGS: Lower chest: No acute abnormality. Hepatobiliary: Hepatic steatosis. Large solitary gallstone is noted. No cholelithiasis. Pancreas: Unremarkable. No pancreatic ductal dilatation or surrounding inflammatory  changes. Spleen: Normal in size without focal abnormality. Adrenals/Urinary Tract: Adrenal glands are unremarkable. Kidneys are normal, without renal calculi, focal lesion, or hydronephrosis. Bladder is unremarkable. Stomach/Bowel: The stomach is unremarkable. There is no evidence of bowel obstruction. The appendix appears normal. Inflammatory changes are seen involving the distal descending and  proximal sigmoid colon consistent with diverticulitis. No abscess formation is noted. Vascular/Lymphatic: Aortic atherosclerosis. No enlarged abdominal or pelvic lymph nodes. Reproductive: Uterus and bilateral adnexa are unremarkable. Other: No abdominal wall hernia or abnormality. No abdominopelvic ascites. Musculoskeletal: No acute or significant osseous findings. IMPRESSION: Findings consistent with diverticulitis involving the distal descending and proximal sigmoid colon. Hepatic steatosis. Large solitary gallstone. Aortic Atherosclerosis (ICD10-I70.0). Electronically Signed   By: Lupita Raider M.D.   On: 09/06/2023 17:28   CT ABDOMEN PELVIS WO CONTRAST Result Date: 08/25/2023 CLINICAL DATA:  Abdominal pain, acute, non localized, vomiting, diarrhea EXAM: CT ABDOMEN AND PELVIS WITHOUT CONTRAST TECHNIQUE: Multidetector CT imaging of the abdomen and pelvis was performed following the standard protocol without IV contrast. RADIATION DOSE REDUCTION: This exam was performed according to the departmental dose-optimization program which includes automated exposure control, adjustment of the mA and/or kV according to patient size and/or use of iterative reconstruction technique. COMPARISON:  None Available. FINDINGS: Lower chest: No acute abnormality. Hepatobiliary: Unremarkable liver. Cholelithiasis. No evidence of acute cholecystitis. No biliary dilation. Pancreas: Unremarkable. Spleen: Unremarkable. Adrenals/Urinary Tract: Normal adrenal glands. No urinary calculi or hydronephrosis. Bladder is unremarkable. Stomach/Bowel: Normal caliber large and small bowel. Descending and sigmoid colon diverticulosis. There is mild wall thickening and adjacent inflammatory stranding about the proximal sigmoid colon. The appendix is normal.Stomach is within normal limits. Vascular/Lymphatic: No significant vascular findings are present. No enlarged abdominal or pelvic lymph nodes. Reproductive: Unremarkable. Other: No abscess or  free intraperitoneal air. Musculoskeletal: No acute fracture. IMPRESSION: 1. Acute uncomplicated sigmoid diverticulitis. Consider follow-up colonoscopy following resolution of patient's acute symptoms to exclude underlying mass. Electronically Signed   By: Minerva Fester M.D.   On: 08/25/2023 21:40    Final Reassessment and Plan:   CT scan showed ongoing diverticulitis despite aggressive management in the outpatient setting. Given interval continuation of syndrome, I believe this patient be most safely managed in the inpatient setting.  Started on Zosyn for broadening of antibiotics.  Consulted hospitalist for admission.  Disposition:   Based on the above findings, I believe this patient is stable for admission.    Patient/family educated about specific findings on our evaluation and explained exact reasons for admission.  Patient/family educated about clinical situation and time was allowed to answer questions.   Admission team communicated with and agreed with need for admission. Patient admitted. Patient ready to move at this time.     Emergency Department Medication Summary:   Medications  morphine (PF) 2 MG/ML injection 2 mg (2 mg Intravenous Given 09/06/23 2302)  cefTRIAXone (ROCEPHIN) 2 g in sodium chloride 0.9 % 100 mL IVPB (2 g Intravenous New Bag/Given 09/06/23 2239)  metroNIDAZOLE (FLAGYL) IVPB 500 mg (has no administration in time range)  enoxaparin (LOVENOX) injection 40 mg (40 mg Subcutaneous Given 09/06/23 2241)  potassium chloride (KLOR-CON) packet 40 mEq (has no administration in time range)  lactated ringers infusion (has no administration in time range)  acetaminophen (TYLENOL) tablet 650 mg (has no administration in time range)    Or  acetaminophen (TYLENOL) suppository 650 mg (has no administration in time range)  ondansetron (ZOFRAN) tablet 4 mg ( Oral See Alternative 09/06/23 2255)  Or  ondansetron (ZOFRAN) injection 4 mg (4 mg Intravenous Given 09/06/23 2255)   senna-docusate (Senokot-S) tablet 1 tablet (has no administration in time range)  albuterol (PROVENTIL) (2.5 MG/3ML) 0.083% nebulizer solution 3 mL (has no administration in time range)  amLODipine (NORVASC) tablet 10 mg (has no administration in time range)  metoprolol succinate (TOPROL-XL) 24 hr tablet 50 mg (has no administration in time range)  lactated ringers bolus 1,000 mL (0 mLs Intravenous Stopped 09/06/23 1811)  ondansetron (ZOFRAN) injection 4 mg (4 mg Intravenous Given 09/06/23 1705)  piperacillin-tazobactam (ZOSYN) IVPB 3.375 g (0 g Intravenous Stopped 09/06/23 1838)            Clinical Impression:  1. Generalized abdominal pain      Admit   Final Clinical Impression(s) / ED Diagnoses Final diagnoses:  Generalized abdominal pain    Rx / DC Orders ED Discharge Orders     None         Glyn Ade, MD 09/06/23 2316

## 2023-09-06 NOTE — ED Notes (Signed)
-  Called [facility] at 710pm for transportation to [facility].

## 2023-09-06 NOTE — ED Notes (Signed)
 [facility] at bedside to transport pt to [facility]

## 2023-09-06 NOTE — ED Triage Notes (Signed)
 Pt c/o "severe" LLQ pain with diarrhea starting yesterday, hx of diverticulitis

## 2023-09-06 NOTE — Hospital Course (Addendum)
 Brooke Coleman is a 73 y.o. female with medical history significant for HTN, HLD, depression/anxiety, parkinsonism who presented with ongoing abdominal pain and diarrhea.  She had recently been treated with Augmentin outpatient for diverticulitis initially on 08/21/2023.  She feels like her symptoms did not improve but her daughter says that she did get a little better on antibiotics prior to getting worse again. Repeat CT abdomen/pelvis was performed on 09/06/2023 showing persistent sigmoid diverticulitis with no signs of complication. She also underwent stool testing given recent antibiotic use and persistent diarrhea.  GI panel was positive for norovirus and C. difficile testing was also positive. She was started on Rocephin and Flagyl on admission for diverticulitis and also started on oral vancomycin upon C. difficile testing being positive. Diarrhea was slow to resolve, but showed some improvement prior to discharge.

## 2023-09-07 ENCOUNTER — Encounter: Payer: Self-pay | Admitting: Internal Medicine

## 2023-09-07 ENCOUNTER — Telehealth (HOSPITAL_COMMUNITY): Payer: Self-pay | Admitting: Pharmacy Technician

## 2023-09-07 ENCOUNTER — Other Ambulatory Visit (HOSPITAL_COMMUNITY): Payer: Self-pay

## 2023-09-07 DIAGNOSIS — R1084 Generalized abdominal pain: Secondary | ICD-10-CM | POA: Diagnosis present

## 2023-09-07 DIAGNOSIS — Z1152 Encounter for screening for COVID-19: Secondary | ICD-10-CM | POA: Diagnosis not present

## 2023-09-07 DIAGNOSIS — J4520 Mild intermittent asthma, uncomplicated: Secondary | ICD-10-CM | POA: Diagnosis present

## 2023-09-07 DIAGNOSIS — Z79899 Other long term (current) drug therapy: Secondary | ICD-10-CM | POA: Diagnosis not present

## 2023-09-07 DIAGNOSIS — F32A Depression, unspecified: Secondary | ICD-10-CM | POA: Diagnosis present

## 2023-09-07 DIAGNOSIS — Z87891 Personal history of nicotine dependence: Secondary | ICD-10-CM | POA: Diagnosis not present

## 2023-09-07 DIAGNOSIS — F419 Anxiety disorder, unspecified: Secondary | ICD-10-CM | POA: Diagnosis present

## 2023-09-07 DIAGNOSIS — Z8249 Family history of ischemic heart disease and other diseases of the circulatory system: Secondary | ICD-10-CM | POA: Diagnosis not present

## 2023-09-07 DIAGNOSIS — K5732 Diverticulitis of large intestine without perforation or abscess without bleeding: Secondary | ICD-10-CM | POA: Diagnosis present

## 2023-09-07 DIAGNOSIS — E782 Mixed hyperlipidemia: Secondary | ICD-10-CM | POA: Diagnosis present

## 2023-09-07 DIAGNOSIS — Z888 Allergy status to other drugs, medicaments and biological substances status: Secondary | ICD-10-CM | POA: Diagnosis not present

## 2023-09-07 DIAGNOSIS — Z91041 Radiographic dye allergy status: Secondary | ICD-10-CM | POA: Diagnosis not present

## 2023-09-07 DIAGNOSIS — Z91012 Allergy to eggs: Secondary | ICD-10-CM | POA: Diagnosis not present

## 2023-09-07 DIAGNOSIS — A0472 Enterocolitis due to Clostridium difficile, not specified as recurrent: Secondary | ICD-10-CM | POA: Diagnosis present

## 2023-09-07 DIAGNOSIS — F341 Dysthymic disorder: Secondary | ICD-10-CM | POA: Diagnosis present

## 2023-09-07 DIAGNOSIS — A0811 Acute gastroenteropathy due to Norwalk agent: Secondary | ICD-10-CM | POA: Diagnosis present

## 2023-09-07 DIAGNOSIS — G20A1 Parkinson's disease without dyskinesia, without mention of fluctuations: Secondary | ICD-10-CM | POA: Diagnosis present

## 2023-09-07 DIAGNOSIS — E876 Hypokalemia: Secondary | ICD-10-CM | POA: Diagnosis present

## 2023-09-07 DIAGNOSIS — K760 Fatty (change of) liver, not elsewhere classified: Secondary | ICD-10-CM | POA: Diagnosis present

## 2023-09-07 DIAGNOSIS — Z881 Allergy status to other antibiotic agents status: Secondary | ICD-10-CM | POA: Diagnosis not present

## 2023-09-07 DIAGNOSIS — K5792 Diverticulitis of intestine, part unspecified, without perforation or abscess without bleeding: Secondary | ICD-10-CM | POA: Diagnosis present

## 2023-09-07 DIAGNOSIS — Z7982 Long term (current) use of aspirin: Secondary | ICD-10-CM | POA: Diagnosis not present

## 2023-09-07 DIAGNOSIS — I10 Essential (primary) hypertension: Secondary | ICD-10-CM | POA: Diagnosis present

## 2023-09-07 LAB — GASTROINTESTINAL PANEL BY PCR, STOOL (REPLACES STOOL CULTURE)

## 2023-09-07 LAB — COMPREHENSIVE METABOLIC PANEL WITH GFR
ALT: 17 U/L (ref 0–44)
AST: 20 U/L (ref 15–41)
Albumin: 3.3 g/dL — ABNORMAL LOW (ref 3.5–5.0)
Alkaline Phosphatase: 59 U/L (ref 38–126)
Anion gap: 11 (ref 5–15)
BUN: 12 mg/dL (ref 8–23)
CO2: 20 mmol/L — ABNORMAL LOW (ref 22–32)
Calcium: 8.3 mg/dL — ABNORMAL LOW (ref 8.9–10.3)
Chloride: 105 mmol/L (ref 98–111)
Creatinine, Ser: 1.01 mg/dL — ABNORMAL HIGH (ref 0.44–1.00)
GFR, Estimated: 60 mL/min — ABNORMAL LOW (ref >60)
Glucose, Bld: 121 mg/dL — ABNORMAL HIGH (ref 70–99)
Potassium: 3.4 mmol/L — ABNORMAL LOW (ref 3.5–5.1)
Sodium: 136 mmol/L (ref 135–145)
Total Bilirubin: 1.1 mg/dL (ref 0.0–1.2)
Total Protein: 6.8 g/dL (ref 6.5–8.1)

## 2023-09-07 LAB — CBC
HCT: 38 % (ref 36.0–46.0)
Hemoglobin: 12.2 g/dL (ref 12.0–15.0)
MCH: 31.4 pg (ref 26.0–34.0)
MCHC: 32.1 g/dL (ref 30.0–36.0)
MCV: 97.7 fL (ref 80.0–100.0)
Platelets: 235 10*3/uL (ref 150–400)
RBC: 3.89 MIL/uL (ref 3.87–5.11)
RDW: 13.5 % (ref 11.5–15.5)
WBC: 16 10*3/uL — ABNORMAL HIGH (ref 4.0–10.5)
nRBC: 0 % (ref 0.0–0.2)

## 2023-09-07 LAB — C DIFFICILE QUICK SCREEN W PCR REFLEX
C Diff antigen: POSITIVE — AB
C Diff interpretation: DETECTED
C Diff toxin: POSITIVE — AB

## 2023-09-07 LAB — VITAMIN B12: Vitamin B-12: >1537 pg/mL — ABNORMAL HIGH (ref 211–911)

## 2023-09-07 LAB — HIV ANTIBODY (ROUTINE TESTING W REFLEX): HIV Screen 4th Generation wRfx: NONREACTIVE

## 2023-09-07 LAB — VITAMIN D 25 HYDROXY (VIT D DEFICIENCY, FRACTURES): VITD: 60.37 ng/mL (ref 30.00–100.00)

## 2023-09-07 MED ORDER — DICYCLOMINE HCL 20 MG PO TABS
20.0000 mg | ORAL_TABLET | Freq: Three times a day (TID) | ORAL | Status: DC
  Administered 2023-09-07 – 2023-09-11 (×17): 20 mg via ORAL
  Filled 2023-09-07 (×19): qty 1

## 2023-09-07 MED ORDER — LACTATED RINGERS IV SOLN: INTRAVENOUS | Status: AC

## 2023-09-07 MED ORDER — MORPHINE SULFATE (PF) 2 MG/ML IV SOLN: 2.0000 mg | INTRAVENOUS | Status: DC | PRN

## 2023-09-07 MED ORDER — HYDROMORPHONE HCL 1 MG/ML IJ SOLN
1.0000 mg | INTRAMUSCULAR | Status: DC | PRN
  Administered 2023-09-07 (×2): 1 mg via INTRAVENOUS
  Filled 2023-09-07 (×2): qty 1

## 2023-09-07 MED ORDER — METHOCARBAMOL 1000 MG/10ML IJ SOLN
500.0000 mg | Freq: Four times a day (QID) | INTRAMUSCULAR | Status: DC | PRN
  Administered 2023-09-07 – 2023-09-08 (×2): 500 mg via INTRAVENOUS
  Filled 2023-09-07 (×2): qty 10

## 2023-09-07 MED ORDER — PROCHLORPERAZINE EDISYLATE 10 MG/2ML IJ SOLN
10.0000 mg | Freq: Four times a day (QID) | INTRAMUSCULAR | Status: DC | PRN
  Administered 2023-09-07 – 2023-09-10 (×2): 10 mg via INTRAVENOUS
  Filled 2023-09-07 (×2): qty 2

## 2023-09-07 MED ORDER — VANCOMYCIN HCL 125 MG PO CAPS
125.0000 mg | ORAL_CAPSULE | Freq: Four times a day (QID) | ORAL | Status: DC
  Administered 2023-09-07 – 2023-09-11 (×16): 125 mg via ORAL
  Filled 2023-09-07 (×18): qty 1

## 2023-09-07 NOTE — Plan of Care (Signed)
   Problem: Education: Goal: Knowledge of General Education information will improve Description Including pain rating scale, medication(s)/side effects and non-pharmacologic comfort measures Outcome: Progressing   Problem: Health Behavior/Discharge Planning: Goal: Ability to manage health-related needs will improve Outcome: Progressing

## 2023-09-07 NOTE — Telephone Encounter (Signed)
 Patient Product/process development scientist completed.    The patient is insured through [employer]. Patient has Medicare and is not eligible for a copay card, but may be able to apply for patient assistance or Medicare RX Payment Plan (Patient Must reach out to their plan, if eligible for payment plan), if available.    Ran test claim for Vancomycin 125 mg and the current 10 day co-pay is $5.00.   This test claim was processed through [facility]- copay amounts may vary at other pharmacies due to pharmacy/plan contracts, or as the patient moves through the different stages of their insurance plan.     Roland Earl, CPHT Pharmacy Technician III Certified Patient Advocate [facility] Patient Advocate Team Direct Number: [phone]  Fax: [phone]

## 2023-09-07 NOTE — Assessment & Plan Note (Signed)
-

## 2023-09-07 NOTE — Assessment & Plan Note (Addendum)
-   no longer on Zoloft per med rec

## 2023-09-07 NOTE — Assessment & Plan Note (Signed)
-   Per med rec, not on medications

## 2023-09-07 NOTE — Progress Notes (Signed)
 Progress Note    Brooke Coleman   ZOX:[id number]  DOB: September 28, 1950  DOA: 09/06/2023     0 PCP: Willow Ora, MD  Initial CC: Abdominal pain, diarrhea  Hospital Course: Ms. Brooke Coleman is a 73 y.o. female with medical history significant for HTN, HLD, depression/anxiety, parkinsonism who presented with ongoing abdominal pain and diarrhea.  She had recently been treated with Augmentin outpatient for diverticulitis initially on 08/21/2023.  She feels like her symptoms did not improve but her daughter says that she did get a little better on antibiotics prior to getting worse again. Repeat CT abdomen/pelvis was performed on 09/06/2023 showing persistent sigmoid diverticulitis with no signs of complication. She also underwent stool testing given recent antibiotic use and persistent diarrhea.  GI panel was positive for norovirus and C. difficile testing was also positive. She was started on Rocephin and Flagyl on admission for diverticulitis and also started on oral vancomycin upon C. difficile testing being positive.  Interval History:  Seen this morning and she was appearing rather uncomfortable.  Still having ongoing diarrhea as of this morning and ongoing abdominal pain.  Has not been tolerating adequate oral nutrition at home either and still requiring fluids at this time. Stool studies sent off this morning and came back positive for norovirus and C. Difficile.   Assessment and Plan: * Diverticulitis of large intestine without perforation or abscess without bleeding - Initially diagnosed around 08/21/2023 and initial CT performed on 08/25/2023; treated with Augmentin outpatient with minimal improvement - Repeat CT abdomen/pelvis performed on admission showing persistent diverticulitis involving descending and proximal sigmoid - Continue Rocephin and Flagyl -Needs outpatient follow-up with GI for colonoscopy in about 6 to 8 weeks -Still uncomfortable on exam, continue clear liquids and IV  fluids - Add Bentyl - Change IV opioids to Dilaudid  C. difficile colitis - Possible etiology from recent antibiotic use; first episode regardless - Start oral vancomycin -No signs of complications such as toxic megacolon noted on CT on admission - Monitor clinically for improvement  Gastroenteritis due to norovirus - Continue fluids and supportive care  Hypokalemia - Replete as needed  Mixed hyperlipidemia - No meds noted at home  Parkinson disease (HCC) - Per med rec, not on medications  Dysthymia - no longer on Zoloft per med rec  Essential hypertension - Can continue amlodipine and Toprol   Old records reviewed in assessment of this patient  Antimicrobials: Rocephin 09/06/2023 >> current Flagyl 09/06/2023 >> current Oral vancomycin 09/07/2023 >> current  DVT prophylaxis:  enoxaparin (LOVENOX) injection 40 mg Start: 09/06/23 2200   Code Status:   Code Status: Full Code  Mobility Assessment (Last 72 Hours)     Mobility Assessment     Row Name 09/07/23 0850 09/06/23 2016         Does patient have an order for bedrest or is patient medically unstable No - Continue assessment No - Continue assessment      What is the highest level of mobility based on the progressive mobility assessment? Level 5 (Walks with assist in room/hall) - Balance while stepping forward/back and can walk in room with assist - Complete Level 5 (Walks with assist in room/hall) - Balance while stepping forward/back and can walk in room with assist - Complete               Barriers to discharge: None Disposition Plan: Home HH orders placed: N/A Status is: Inpatient  Objective: Blood pressure 113/72, pulse 82, temperature 98.6 F (37 C),  temperature source Oral, resp. rate 18, height 5\' 10"  (1.778 m), weight 113.6 kg, SpO2 94%.  Examination:  Physical Exam Constitutional:      General: She is not in acute distress.    Comments: Uncomfortable appearing  HENT:     Head:  Normocephalic and atraumatic.     Mouth/Throat:     Mouth: Mucous membranes are moist.  Eyes:     Extraocular Movements: Extraocular movements intact.  Cardiovascular:     Rate and Rhythm: Normal rate and regular rhythm.  Pulmonary:     Effort: Pulmonary effort is normal. No respiratory distress.     Breath sounds: Normal breath sounds. No wheezing.  Abdominal:     General: Bowel sounds are normal. There is no distension.     Palpations: Abdomen is soft.     Tenderness: There is abdominal tenderness in the periumbilical area and left lower quadrant. There is no rebound.  Musculoskeletal:        General: Normal range of motion.     Cervical back: Normal range of motion and neck supple.  Skin:    General: Skin is warm and dry.  Neurological:     General: No focal deficit present.  Psychiatric:        Mood and Affect: Mood normal.      Consultants:    Procedures:    Data Reviewed: Results for orders placed or performed during the hospital encounter of 09/06/23 (from the past 24 hours)  C Difficile Quick Screen w PCR reflex     Status: Abnormal   Collection Time: 09/06/23  3:16 PM   Specimen: STOOL  Result Value Ref Range   C Diff antigen POSITIVE (A) NEGATIVE   C Diff toxin POSITIVE (A) NEGATIVE   C Diff interpretation Toxin producing C. difficile detected.   CBC with Differential     Status: Abnormal   Collection Time: 09/06/23  4:31 PM  Result Value Ref Range   WBC 16.8 (H) 4.0 - 10.5 K/uL   RBC 4.24 3.87 - 5.11 MIL/uL   Hemoglobin 13.3 12.0 - 15.0 g/dL   HCT 81.1 91.4 - 78.2 %   MCV 91.7 80.0 - 100.0 fL   MCH 31.4 26.0 - 34.0 pg   MCHC 34.2 30.0 - 36.0 g/dL   RDW 95.6 21.3 - 08.6 %   Platelets 273 150 - 400 K/uL   nRBC 0.0 0.0 - 0.2 %   Neutrophils Relative % 84 %   Neutro Abs 14.1 (H) 1.7 - 7.7 K/uL   Lymphocytes Relative 8 %   Lymphs Abs 1.3 0.7 - 4.0 K/uL   Monocytes Relative 8 %   Monocytes Absolute 1.3 (H) 0.1 - 1.0 K/uL   Eosinophils Relative 0 %    Eosinophils Absolute 0.0 0.0 - 0.5 K/uL   Basophils Relative 0 %   Basophils Absolute 0.0 0.0 - 0.1 K/uL   Immature Granulocytes 0 %   Abs Immature Granulocytes 0.06 0.00 - 0.07 K/uL  Comprehensive metabolic panel     Status: Abnormal   Collection Time: 09/06/23  4:31 PM  Result Value Ref Range   Sodium 136 135 - 145 mmol/L   Potassium 3.2 (L) 3.5 - 5.1 mmol/L   Chloride 103 98 - 111 mmol/L   CO2 23 22 - 32 mmol/L   Glucose, Bld 132 (H) 70 - 99 mg/dL   BUN 10 8 - 23 mg/dL   Creatinine, Ser 5.78 0.44 - 1.00 mg/dL   Calcium 8.9 8.9 -  10.3 mg/dL   Total Protein 7.1 6.5 - 8.1 g/dL   Albumin 4.0 3.5 - 5.0 g/dL   AST 15 15 - 41 U/L   ALT 12 0 - 44 U/L   Alkaline Phosphatase 66 38 - 126 U/L   Total Bilirubin 1.4 (H) 0.0 - 1.2 mg/dL   GFR, Estimated >04 >54 mL/min   Anion gap 10 5 - 15  Lipase, blood     Status: Abnormal   Collection Time: 09/06/23  4:31 PM  Result Value Ref Range   Lipase <10 (L) 11 - 51 U/L  Troponin I (High Sensitivity)     Status: None   Collection Time: 09/06/23  4:31 PM  Result Value Ref Range   Troponin I (High Sensitivity) 4 <18 ng/L  Blood culture (routine x 2)     Status: None (Preliminary result)   Collection Time: 09/06/23  4:31 PM   Specimen: BLOOD  Result Value Ref Range   Specimen Description      BLOOD RIGHT ANTECUBITAL Performed at [facility], [address], [place], [place] [address]    Special Requests      BOTTLES DRAWN AEROBIC AND ANAEROBIC Blood Culture adequate volume Performed at [facility], [address], [place], [place] [address]    Culture      NO GROWTH < 24 HOURS Performed at [facility], [address], [place], [place] [address]    Report Status PENDING   Resp panel by RT-PCR (RSV, Flu A&B, Covid) Anterior Nasal Swab     Status: None   Collection Time: 09/06/23  5:08 PM   Specimen: Anterior Nasal Swab  Result Value Ref Range   SARS Coronavirus 2 by RT PCR NEGATIVE  NEGATIVE   Influenza A by PCR NEGATIVE NEGATIVE   Influenza B by PCR NEGATIVE NEGATIVE   Resp Syncytial Virus by PCR NEGATIVE NEGATIVE  Lactic acid, plasma     Status: None   Collection Time: 09/06/23  5:10 PM  Result Value Ref Range   Lactic Acid, Venous 1.0 0.5 - 1.9 mmol/L  Blood culture (routine x 2)     Status: None (Preliminary result)   Collection Time: 09/06/23  9:05 PM   Specimen: BLOOD LEFT ARM  Result Value Ref Range   Specimen Description      BLOOD LEFT ARM Performed at [facility], [address], [place], [place] [address]    Special Requests      BOTTLES DRAWN AEROBIC AND ANAEROBIC Blood Culture adequate volume Performed at [facility], [address], [place], [place] [address]    Culture      NO GROWTH < 12 HOURS Performed at [facility], [address], [place], [place] [address]    Report Status PENDING   Gastrointestinal Panel by PCR , Stool     Status: Abnormal   Collection Time: 09/06/23 11:26 PM   Specimen: Stool  Result Value Ref Range   Campylobacter species NOT DETECTED NOT DETECTED   Plesimonas shigelloides NOT DETECTED NOT DETECTED   Salmonella species NOT DETECTED NOT DETECTED   Yersinia enterocolitica NOT DETECTED NOT DETECTED   Vibrio species NOT DETECTED NOT DETECTED   Vibrio cholerae NOT DETECTED NOT DETECTED   Enteroaggregative E coli (EAEC) NOT DETECTED NOT DETECTED   Enteropathogenic E coli (EPEC) NOT DETECTED NOT DETECTED   Enterotoxigenic E coli (ETEC) NOT DETECTED NOT DETECTED   Shiga like toxin producing E coli (STEC) NOT  DETECTED NOT DETECTED   Shigella/Enteroinvasive E coli (EIEC) NOT DETECTED NOT DETECTED   Cryptosporidium NOT DETECTED NOT DETECTED   Cyclospora cayetanensis NOT DETECTED NOT DETECTED   Entamoeba histolytica NOT DETECTED NOT DETECTED   Giardia lamblia NOT DETECTED NOT DETECTED   Adenovirus F40/41 NOT DETECTED NOT DETECTED   Astrovirus NOT DETECTED NOT DETECTED   Norovirus GI/GII  DETECTED (A) NOT DETECTED   Rotavirus A NOT DETECTED NOT DETECTED   Sapovirus (I, II, IV, and V) NOT DETECTED NOT DETECTED  HIV Antibody (routine testing w rflx)     Status: None   Collection Time: 09/07/23  4:21 AM  Result Value Ref Range   HIV Screen 4th Generation wRfx Non Reactive Non Reactive  CBC     Status: Abnormal   Collection Time: 09/07/23  4:21 AM  Result Value Ref Range   WBC 16.0 (H) 4.0 - 10.5 K/uL   RBC 3.89 3.87 - 5.11 MIL/uL   Hemoglobin 12.2 12.0 - 15.0 g/dL   HCT 81.1 91.4 - 78.2 %   MCV 97.7 80.0 - 100.0 fL   MCH 31.4 26.0 - 34.0 pg   MCHC 32.1 30.0 - 36.0 g/dL   RDW 95.6 21.3 - 08.6 %   Platelets 235 150 - 400 K/uL   nRBC 0.0 0.0 - 0.2 %  Comprehensive metabolic panel     Status: Abnormal   Collection Time: 09/07/23  4:21 AM  Result Value Ref Range   Sodium 136 135 - 145 mmol/L   Potassium 3.4 (L) 3.5 - 5.1 mmol/L   Chloride 105 98 - 111 mmol/L   CO2 20 (L) 22 - 32 mmol/L   Glucose, Bld 121 (H) 70 - 99 mg/dL   BUN 12 8 - 23 mg/dL   Creatinine, Ser 5.78 (H) 0.44 - 1.00 mg/dL   Calcium 8.3 (L) 8.9 - 10.3 mg/dL   Total Protein 6.8 6.5 - 8.1 g/dL   Albumin 3.3 (L) 3.5 - 5.0 g/dL   AST 20 15 - 41 U/L   ALT 17 0 - 44 U/L   Alkaline Phosphatase 59 38 - 126 U/L   Total Bilirubin 1.1 0.0 - 1.2 mg/dL   GFR, Estimated 60 (L) >60 mL/min   Anion gap 11 5 - 15    I have reviewed pertinent nursing notes, vitals, labs, and images as necessary. I have ordered labwork to follow up on as indicated.  I have reviewed the last notes from staff over past 24 hours. I have discussed patient's care plan and test results with nursing staff, CM/SW, and other staff as appropriate.  Time spent: Greater than 50% of the 55 minute visit was spent in counseling/coordination of care for the patient as laid out in the A&P.   LOS: 0 days   Lewie Chamber, MD [facility] 09/07/2023, 11:12 AM

## 2023-09-07 NOTE — Assessment & Plan Note (Addendum)
-   s/p fluids and supportive care

## 2023-09-07 NOTE — Assessment & Plan Note (Signed)
-   Can continue amlodipine and Toprol

## 2023-09-07 NOTE — Assessment & Plan Note (Addendum)
-   Initially diagnosed around 08/21/2023 and initial CT performed on 08/25/2023; treated with Augmentin outpatient with minimal improvement - Repeat CT abdomen/pelvis performed on admission showing persistent diverticulitis involving descending and proximal sigmoid - Continue Rocephin and Flagyl -Needs outpatient follow-up with GI for colonoscopy in about 6 to 8 weeks - slightly better today; will advance diet - continue pain control and IVF

## 2023-09-07 NOTE — Progress Notes (Signed)
 Lab called to verify that Brooke Coleman is positive for Noroviris and Cdiff.

## 2023-09-07 NOTE — Assessment & Plan Note (Signed)
-   No meds noted at home

## 2023-09-07 NOTE — Assessment & Plan Note (Addendum)
-   Possible etiology from recent antibiotic use; first episode regardless - continue oral vancomycin -No signs of complications such as toxic megacolon noted on CT on admission - Monitor clinically for improvement - some confounding of diarrhea with superimposed norovirus infection; stool frequency is starting to decrease some but still type VII stools

## 2023-09-08 DIAGNOSIS — A0472 Enterocolitis due to Clostridium difficile, not specified as recurrent: Secondary | ICD-10-CM | POA: Diagnosis not present

## 2023-09-08 DIAGNOSIS — K5732 Diverticulitis of large intestine without perforation or abscess without bleeding: Secondary | ICD-10-CM | POA: Diagnosis not present

## 2023-09-08 DIAGNOSIS — A0811 Acute gastroenteropathy due to Norwalk agent: Secondary | ICD-10-CM | POA: Diagnosis not present

## 2023-09-08 LAB — CBC WITH DIFFERENTIAL/PLATELET
Abs Immature Granulocytes: 0.07 10*3/uL (ref 0.00–0.07)
Basophils Absolute: 0.1 10*3/uL (ref 0.0–0.1)
Basophils Relative: 1 %
Eosinophils Absolute: 0.3 10*3/uL (ref 0.0–0.5)
Eosinophils Relative: 2 %
HCT: 35.8 % — ABNORMAL LOW (ref 36.0–46.0)
Hemoglobin: 11.4 g/dL — ABNORMAL LOW (ref 12.0–15.0)
Immature Granulocytes: 1 %
Lymphocytes Relative: 19 %
Lymphs Abs: 2.2 10*3/uL (ref 0.7–4.0)
MCH: 31.2 pg (ref 26.0–34.0)
MCHC: 31.8 g/dL (ref 30.0–36.0)
MCV: 98.1 fL (ref 80.0–100.0)
Monocytes Absolute: 0.9 10*3/uL (ref 0.1–1.0)
Monocytes Relative: 8 %
Neutro Abs: 8.1 10*3/uL — ABNORMAL HIGH (ref 1.7–7.7)
Neutrophils Relative %: 69 %
Platelets: 199 10*3/uL (ref 150–400)
RBC: 3.65 MIL/uL — ABNORMAL LOW (ref 3.87–5.11)
RDW: 13.2 % (ref 11.5–15.5)
WBC: 11.6 10*3/uL — ABNORMAL HIGH (ref 4.0–10.5)
nRBC: 0 % (ref 0.0–0.2)

## 2023-09-08 LAB — BASIC METABOLIC PANEL WITH GFR
Anion gap: 10 (ref 5–15)
BUN: 8 mg/dL (ref 8–23)
CO2: 23 mmol/L (ref 22–32)
Calcium: 8.5 mg/dL — ABNORMAL LOW (ref 8.9–10.3)
Chloride: 106 mmol/L (ref 98–111)
Creatinine, Ser: 0.75 mg/dL (ref 0.44–1.00)
GFR, Estimated: >60 mL/min (ref >60)
Glucose, Bld: 100 mg/dL — ABNORMAL HIGH (ref 70–99)
Potassium: 3.6 mmol/L (ref 3.5–5.1)
Sodium: 139 mmol/L (ref 135–145)

## 2023-09-08 LAB — MAGNESIUM: Magnesium: 1.8 mg/dL (ref 1.7–2.4)

## 2023-09-08 MED ORDER — LACTATED RINGERS IV SOLN: INTRAVENOUS | Status: DC

## 2023-09-08 NOTE — Plan of Care (Signed)

## 2023-09-08 NOTE — Progress Notes (Signed)
 Mobility Specialist - Progress Note   09/08/23 0854  Mobility  Activity Ambulated with assistance in hallway  Level of Assistance Modified independent, requires aide device or extra time  Assistive Device Front wheel walker  Distance Ambulated (ft) 650 ft  Activity Response Tolerated well  Mobility Referral Yes  Mobility visit 1 Mobility  Mobility Specialist Start Time (ACUTE ONLY) [id number]  Mobility Specialist Stop Time (ACUTE ONLY) [id number]  Mobility Specialist Time Calculation (min) (ACUTE ONLY) 16 min   Pt received in bed and agreeable to mobility. No complaints during session. Pt to bed after session with all needs met.    [facility]

## 2023-09-08 NOTE — Progress Notes (Addendum)
 Progress Note    Brooke Coleman   ZOX:[id number]  DOB: October 05, 1950  DOA: 09/06/2023     1 PCP: Willow Ora, MD  Initial CC: Abdominal pain, diarrhea  Hospital Course: Ms. Brooke Coleman is a 73 y.o. female with medical history significant for HTN, HLD, depression/anxiety, parkinsonism who presented with ongoing abdominal pain and diarrhea.  She had recently been treated with Augmentin outpatient for diverticulitis initially on 08/21/2023.  She feels like her symptoms did not improve but her daughter says that she did get a little better on antibiotics prior to getting worse again. Repeat CT abdomen/pelvis was performed on 09/06/2023 showing persistent sigmoid diverticulitis with no signs of complication. She also underwent stool testing given recent antibiotic use and persistent diarrhea.  GI panel was positive for norovirus and C. difficile testing was also positive. She was started on Rocephin and Flagyl on admission for diverticulitis and also started on oral vancomycin upon C. difficile testing being positive.  Interval History:  Still having significant abdominal discomfort and diarrhea.  Does feel a little bit better in general.  Daughter present bedside and also updated.  Assessment and Plan: * Diverticulitis of large intestine without perforation or abscess without bleeding - Initially diagnosed around 08/21/2023 and initial CT performed on 08/25/2023; treated with Augmentin outpatient with minimal improvement - Repeat CT abdomen/pelvis performed on admission showing persistent diverticulitis involving descending and proximal sigmoid - Continue Rocephin and Flagyl -Needs outpatient follow-up with GI for colonoscopy in about 6 to 8 weeks - slightly better today; will advance diet - continue pain control and IVF  C. difficile colitis - Possible etiology from recent antibiotic use; first episode regardless - Start oral vancomycin -No signs of complications such as toxic megacolon noted  on CT on admission - Monitor clinically for improvement  Gastroenteritis due to norovirus - Continue fluids and supportive care  Hypokalemia - Replete as needed  Mixed hyperlipidemia - No meds noted at home  Parkinson disease (HCC) - Per med rec, not on medications  Dysthymia - no longer on Zoloft per med rec  Essential hypertension - Can continue amlodipine and Toprol   Old records reviewed in assessment of this patient  Antimicrobials: Rocephin 09/06/2023 >> current Flagyl 09/06/2023 >> current Oral vancomycin 09/07/2023 >> current  DVT prophylaxis:  enoxaparin (LOVENOX) injection 40 mg Start: 09/06/23 2200   Code Status:   Code Status: Full Code  Mobility Assessment (Last 72 Hours)     Mobility Assessment     Row Name 09/08/23 0800 09/07/23 1931 09/07/23 0850 09/06/23 2016     Does patient have an order for bedrest or is patient medically unstable No - Continue assessment No - Continue assessment No - Continue assessment No - Continue assessment    What is the highest level of mobility based on the progressive mobility assessment? Level 5 (Walks with assist in room/hall) - Balance while stepping forward/back and can walk in room with assist - Complete Level 5 (Walks with assist in room/hall) - Balance while stepping forward/back and can walk in room with assist - Complete Level 5 (Walks with assist in room/hall) - Balance while stepping forward/back and can walk in room with assist - Complete Level 5 (Walks with assist in room/hall) - Balance while stepping forward/back and can walk in room with assist - Complete             Barriers to discharge: None Disposition Plan: Home HH orders placed: N/A Status is: Inpatient  Objective: Blood pressure Marland Kitchen)  141/90, pulse 85, temperature 98.2 F (36.8 C), temperature source Oral, resp. rate 16, height 5\' 10"  (1.778 m), weight 113.6 kg, SpO2 96%.  Examination:  Physical Exam Constitutional:      General: She is not in  acute distress.    Comments: Uncomfortable appearing  HENT:     Head: Normocephalic and atraumatic.     Mouth/Throat:     Mouth: Mucous membranes are moist.  Eyes:     Extraocular Movements: Extraocular movements intact.  Cardiovascular:     Rate and Rhythm: Normal rate and regular rhythm.  Pulmonary:     Effort: Pulmonary effort is normal. No respiratory distress.     Breath sounds: Normal breath sounds. No wheezing.  Abdominal:     General: Bowel sounds are normal. There is no distension.     Palpations: Abdomen is soft.     Tenderness: There is abdominal tenderness in the periumbilical area and left lower quadrant. There is no rebound.  Musculoskeletal:        General: Normal range of motion.     Cervical back: Normal range of motion and neck supple.  Skin:    General: Skin is warm and dry.  Neurological:     General: No focal deficit present.     Comments: Generalized parkinsonian shaking mostly in upper extremities which is chronic and baseline  Psychiatric:        Mood and Affect: Mood normal.      Consultants:    Procedures:    Data Reviewed: Results for orders placed or performed during the hospital encounter of 09/06/23 (from the past 24 hours)  Basic metabolic panel with GFR     Status: Abnormal   Collection Time: 09/08/23  5:36 AM  Result Value Ref Range   Sodium 139 135 - 145 mmol/L   Potassium 3.6 3.5 - 5.1 mmol/L   Chloride 106 98 - 111 mmol/L   CO2 23 22 - 32 mmol/L   Glucose, Bld 100 (H) 70 - 99 mg/dL   BUN 8 8 - 23 mg/dL   Creatinine, Ser 1.61 0.44 - 1.00 mg/dL   Calcium 8.5 (L) 8.9 - 10.3 mg/dL   GFR, Estimated >09 >60 mL/min   Anion gap 10 5 - 15  CBC with Differential/Platelet     Status: Abnormal   Collection Time: 09/08/23  5:36 AM  Result Value Ref Range   WBC 11.6 (H) 4.0 - 10.5 K/uL   RBC 3.65 (L) 3.87 - 5.11 MIL/uL   Hemoglobin 11.4 (L) 12.0 - 15.0 g/dL   HCT 45.4 (L) 09.8 - 11.9 %   MCV 98.1 80.0 - 100.0 fL   MCH 31.2 26.0 - 34.0  pg   MCHC 31.8 30.0 - 36.0 g/dL   RDW 14.7 82.9 - 56.2 %   Platelets 199 150 - 400 K/uL   nRBC 0.0 0.0 - 0.2 %   Neutrophils Relative % 69 %   Neutro Abs 8.1 (H) 1.7 - 7.7 K/uL   Lymphocytes Relative 19 %   Lymphs Abs 2.2 0.7 - 4.0 K/uL   Monocytes Relative 8 %   Monocytes Absolute 0.9 0.1 - 1.0 K/uL   Eosinophils Relative 2 %   Eosinophils Absolute 0.3 0.0 - 0.5 K/uL   Basophils Relative 1 %   Basophils Absolute 0.1 0.0 - 0.1 K/uL   Immature Granulocytes 1 %   Abs Immature Granulocytes 0.07 0.00 - 0.07 K/uL  Magnesium     Status: None   Collection Time:  09/08/23  5:36 AM  Result Value Ref Range   Magnesium 1.8 1.7 - 2.4 mg/dL    I have reviewed pertinent nursing notes, vitals, labs, and images as necessary. I have ordered labwork to follow up on as indicated.  I have reviewed the last notes from staff over past 24 hours. I have discussed patient's care plan and test results with nursing staff, CM/SW, and other staff as appropriate.  Time spent: Greater than 50% of the 55 minute visit was spent in counseling/coordination of care for the patient as laid out in the A&P.   LOS: 1 day   Lewie Chamber, MD [facility] 09/08/2023, 11:28 AM

## 2023-09-09 DIAGNOSIS — K5732 Diverticulitis of large intestine without perforation or abscess without bleeding: Secondary | ICD-10-CM | POA: Diagnosis not present

## 2023-09-09 DIAGNOSIS — A0472 Enterocolitis due to Clostridium difficile, not specified as recurrent: Secondary | ICD-10-CM | POA: Diagnosis not present

## 2023-09-09 DIAGNOSIS — A0811 Acute gastroenteropathy due to Norwalk agent: Secondary | ICD-10-CM | POA: Diagnosis not present

## 2023-09-09 LAB — CBC WITH DIFFERENTIAL/PLATELET
Abs Immature Granulocytes: 0.03 10*3/uL (ref 0.00–0.07)
Basophils Absolute: 0 10*3/uL (ref 0.0–0.1)
Basophils Relative: 1 %
Eosinophils Absolute: 0.4 10*3/uL (ref 0.0–0.5)
Eosinophils Relative: 5 %
HCT: 36.3 % (ref 36.0–46.0)
Hemoglobin: 11.8 g/dL — ABNORMAL LOW (ref 12.0–15.0)
Immature Granulocytes: 0 %
Lymphocytes Relative: 24 %
Lymphs Abs: 1.8 10*3/uL (ref 0.7–4.0)
MCH: 31.5 pg (ref 26.0–34.0)
MCHC: 32.5 g/dL (ref 30.0–36.0)
MCV: 96.8 fL (ref 80.0–100.0)
Monocytes Absolute: 0.6 10*3/uL (ref 0.1–1.0)
Monocytes Relative: 8 %
Neutro Abs: 4.7 10*3/uL (ref 1.7–7.7)
Neutrophils Relative %: 62 %
Platelets: 259 10*3/uL (ref 150–400)
RBC: 3.75 MIL/uL — ABNORMAL LOW (ref 3.87–5.11)
RDW: 13.1 % (ref 11.5–15.5)
WBC: 7.6 10*3/uL (ref 4.0–10.5)
nRBC: 0 % (ref 0.0–0.2)

## 2023-09-09 LAB — BASIC METABOLIC PANEL WITH GFR
Anion gap: 7 (ref 5–15)
BUN: <5 mg/dL — ABNORMAL LOW (ref 8–23)
CO2: 28 mmol/L (ref 22–32)
Calcium: 8.6 mg/dL — ABNORMAL LOW (ref 8.9–10.3)
Chloride: 105 mmol/L (ref 98–111)
Creatinine, Ser: 0.73 mg/dL (ref 0.44–1.00)
GFR, Estimated: >60 mL/min (ref >60)
Glucose, Bld: 101 mg/dL — ABNORMAL HIGH (ref 70–99)
Potassium: 3.3 mmol/L — ABNORMAL LOW (ref 3.5–5.1)
Sodium: 140 mmol/L (ref 135–145)

## 2023-09-09 LAB — MAGNESIUM: Magnesium: 1.9 mg/dL (ref 1.7–2.4)

## 2023-09-09 MED ORDER — OXYCODONE HCL 5 MG PO TABS: 5.0000 mg | ORAL_TABLET | ORAL | Status: DC | PRN

## 2023-09-09 MED ORDER — HYDROMORPHONE HCL 1 MG/ML IJ SOLN: 1.0000 mg | INTRAMUSCULAR | Status: DC | PRN

## 2023-09-09 MED ORDER — POTASSIUM CHLORIDE CRYS ER 20 MEQ PO TBCR
40.0000 meq | EXTENDED_RELEASE_TABLET | Freq: Once | ORAL | Status: AC
  Administered 2023-09-09: 40 meq via ORAL
  Filled 2023-09-09: qty 2

## 2023-09-09 MED ORDER — ACETAMINOPHEN 325 MG PO TABS: 650.0000 mg | ORAL_TABLET | ORAL | Status: DC | PRN

## 2023-09-09 NOTE — Progress Notes (Signed)
 Mobility Specialist - Progress Note   09/09/23 0943  Mobility  Activity Ambulated with assistance in hallway  Level of Assistance Modified independent, requires aide device or extra time  Assistive Device Front wheel walker  Distance Ambulated (ft) 600 ft  Activity Response Tolerated well  Mobility Referral Yes  Mobility visit 1 Mobility  Mobility Specialist Start Time (ACUTE ONLY) [id number]  Mobility Specialist Stop Time (ACUTE ONLY) 0942  Mobility Specialist Time Calculation (min) (ACUTE ONLY) 16 min   Pt received in bed and agreeable to mobility. No complaints during session. Pt to bathroom after session with all needs met.    [facility]

## 2023-09-09 NOTE — TOC Initial Note (Signed)
 Transition of Care [facility]) - Initial/Assessment Note    Patient Details  Name: Brooke Coleman MRN: [id number] Date of Birth: 09/02/1950  Transition of Care [facility]) CM/SW Contact:    Adrian Prows, RN Phone Number: 09/09/2023, 5:12 PM  Clinical Narrative:                 Sherron Monday w/ pt in room; pt says she lives at home w/ her children; she plans to return at d/c; pt identified POC dtr Holley Kocurek [phone]); she says family will provide transportation; pt verified insurance/PCP; she denied SDOH risks; pt says she has walker and shower chair; she does not have HH services, or home oxygen; no TOC needs; TOC is signing off; please place consult if needed.  Expected Discharge Plan: Home/Self Care Barriers to Discharge: Continued Medical Work up   Patient Goals and CMS Choice Patient states their goals for this hospitalization and ongoing recovery are:: home CMS Medicare.gov Compare Post Acute Care list provided to:: Patient   [place] ownership interest in [facility].provided to:: Patient    Expected Discharge Plan and Services   Discharge Planning Services: CM Consult   Living arrangements for the past 2 months: Single Family Home                                      Prior Living Arrangements/Services Living arrangements for the past 2 months: Single Family Home Lives with:: Adult Children Patient language and need for interpreter reviewed:: Yes Do you feel safe going back to the place where you live?: Yes      Need for Family Participation in Patient Care: Yes (Comment) Care giver support system in place?: Yes (comment) Current home services: DME (walker. shower chair) Criminal Activity/Legal Involvement Pertinent to Current Situation/Hospitalization: No - Comment as needed  Activities of Daily Living   ADL Screening (condition at time of admission) Independently performs ADLs?: Yes (appropriate for developmental age) Is the patient deaf or  have difficulty hearing?: No Does the patient have difficulty seeing, even when wearing glasses/contacts?: No Does the patient have difficulty concentrating, remembering, or making decisions?: No  Permission Sought/Granted Permission sought to share information with : Case Manager Permission granted to share information with : Yes, Verbal Permission Granted  Share Information with NAME: Case Manager     Permission granted to share info w Relationship: Caitlynn Ju (dtr) [phone]     Emotional Assessment Appearance:: Appears stated age Attitude/Demeanor/Rapport: Gracious Affect (typically observed): Accepting Orientation: : Oriented to Self, Oriented to Place, Oriented to  Time, Oriented to Situation Alcohol / Substance Use: Not Applicable Psych Involvement: No (comment)  Admission diagnosis:  Generalized abdominal pain [R10.84] Diverticulitis of large intestine without perforation or abscess without bleeding [K57.32] Diverticulitis [K57.92] Patient Active Problem List   Diagnosis Date Noted   Gastroenteritis due to norovirus 09/07/2023   C. difficile colitis 09/07/2023   Diverticulitis 09/07/2023   Diverticulitis of large intestine without perforation or abscess without bleeding 09/06/2023   Hypokalemia 09/06/2023   Mixed hyperlipidemia 09/05/2023   Sigmoid diverticulitis 09/05/2023   Parkinson disease (HCC) 09/21/2022   History of anaphylaxis 06/08/2021   Primary osteoarthritis of both hands 01/14/2020   Primary osteoarthritis of both wrists 01/14/2020   Vitamin D deficiency 01/14/2020   Vitamin B12 deficiency 01/14/2020   Mild intermittent asthma 01/14/2020   Chronic neck pain 01/14/2020   Drusen of macula, bilateral 01/03/2018  Chronic insomnia 01/24/2017   Gastroesophageal reflux disease without esophagitis 11/07/2016   Essential hypertension 11/07/2016   Dysthymia 11/07/2016   Bilateral carpal tunnel syndrome 06/04/2015   Cervical spondylosis without myelopathy  06/04/2015   PCP:  Willow Ora, MD Pharmacy:   [facility] [address] [place]), [place] - [address] [place] ([place]) [place] [address] Phone: [phone] Fax: [phone]     Social Drivers of Health (SDOH) Social History: SDOH Screenings   Food Insecurity: No Food Insecurity (09/09/2023)  Housing: Low Risk  (09/09/2023)  Transportation Needs: No Transportation Needs (09/09/2023)  Utilities: Not At Risk (09/09/2023)  Depression (PHQ2-9): Low Risk  (09/05/2023)  Financial Resource Strain: Low Risk  (08/15/2022)  Physical Activity: Insufficiently Active (08/15/2022)  Social Connections: Moderately Isolated (09/06/2023)  Stress: No Stress Concern Present (08/15/2022)  Tobacco Use: Medium Risk (09/06/2023)   SDOH Interventions: Food Insecurity Interventions: Intervention Not Indicated, Inpatient TOC Housing Interventions: Intervention Not Indicated, Inpatient TOC Transportation Interventions: Intervention Not Indicated, Inpatient TOC Utilities Interventions: Intervention Not Indicated, Inpatient TOC   Readmission Risk Interventions    09/09/2023    5:10 PM  Readmission Risk Prevention Plan  Transportation Screening Complete  PCP or Specialist Appt within 5-7 Days Complete  Home Care Screening Complete  Medication Review (RN CM) Complete

## 2023-09-09 NOTE — Progress Notes (Signed)
 Progress Note    Brooke Coleman   UJW:[id number]  DOB: June 17, 1950  DOA: 09/06/2023     2 PCP: Brooke Ora, MD  Initial CC: Abdominal pain, diarrhea  Hospital Course: [name] is a 73 y.o. female with medical history significant for HTN, HLD, depression/anxiety, parkinsonism who presented with ongoing abdominal pain and diarrhea.  She had recently been treated with Augmentin outpatient for diverticulitis initially on 08/21/2023.  She feels like her symptoms did not improve but her daughter says that she did get a little better on antibiotics prior to getting worse again. Repeat CT abdomen/pelvis was performed on 09/06/2023 showing persistent sigmoid diverticulitis with no signs of complication. She also underwent stool testing given recent antibiotic use and persistent diarrhea.  GI panel was positive for norovirus and C. difficile testing was also positive. She was started on Rocephin and Flagyl on admission for diverticulitis and also started on oral vancomycin upon C. difficile testing being positive.  Interval History:  No events overnight.  She is endorsing feeling much better each day.  Abdominal pain essentially gone.  Stool frequency slightly decreasing but still having liquid stools.  Ambulating well to the bathroom.  Assessment and Plan: * Diverticulitis of large intestine without perforation or abscess without bleeding - Initially diagnosed around 08/21/2023 and initial CT performed on 08/25/2023; treated with Augmentin outpatient with minimal improvement - Repeat CT abdomen/pelvis performed on admission showing persistent diverticulitis involving descending and proximal sigmoid - Continue Rocephin and Flagyl -Needs outpatient follow-up with GI for colonoscopy in about 6 to 8 weeks - pain almost gone; tolerated soft diet well - d/c IVF  C. difficile colitis - Possible etiology from recent antibiotic use; first episode regardless - continue oral vancomycin -No signs of  complications such as toxic megacolon noted on CT on admission - Monitor clinically for improvement - some confounding of diarrhea with superimposed norovirus infection; stool frequency is starting to decrease some but still type VII stools  Gastroenteritis due to norovirus - Continue fluids and supportive care  Hypokalemia - Replete as needed  Mixed hyperlipidemia - No meds noted at home  Parkinson disease (HCC) - Per med rec, not on medications  Dysthymia - no longer on Zoloft per med rec  Essential hypertension - Can continue amlodipine and Toprol   Old records reviewed in assessment of this patient  Antimicrobials: Rocephin 09/06/2023 >> current Flagyl 09/06/2023 >> current Oral vancomycin 09/07/2023 >> current  DVT prophylaxis:  enoxaparin (LOVENOX) injection 40 mg Start: 09/06/23 2200   Code Status:   Code Status: Full Code  Mobility Assessment (Last 72 Hours)     Mobility Assessment     Row Name 09/09/23 0845 09/08/23 2130 09/08/23 0800 09/07/23 1931 09/07/23 0850   Does patient have an order for bedrest or is patient medically unstable No - Continue assessment No - Continue assessment No - Continue assessment No - Continue assessment No - Continue assessment   What is the highest level of mobility based on the progressive mobility assessment? Level 5 (Walks with assist in room/hall) - Balance while stepping forward/back and can walk in room with assist - Complete Level 5 (Walks with assist in room/hall) - Balance while stepping forward/back and can walk in room with assist - Complete Level 5 (Walks with assist in room/hall) - Balance while stepping forward/back and can walk in room with assist - Complete Level 5 (Walks with assist in room/hall) - Balance while stepping forward/back and can walk in room with assist -  Complete Level 5 (Walks with assist in room/hall) - Balance while stepping forward/back and can walk in room with assist - Complete    Row Name 09/06/23  2016           Does patient have an order for bedrest or is patient medically unstable No - Continue assessment       What is the highest level of mobility based on the progressive mobility assessment? Level 5 (Walks with assist in room/hall) - Balance while stepping forward/back and can walk in room with assist - Complete                Barriers to discharge: None Disposition Plan: Home HH orders placed: N/A Status is: Inpatient  Objective: Blood pressure (!) 129/94, pulse 75, temperature 98.2 F (36.8 C), temperature source Oral, resp. rate 16, height 5\' 10"  (1.778 m), weight 113.6 kg, SpO2 96%.  Examination:  Physical Exam Constitutional:      General: She is not in acute distress.    Comments: Uncomfortable appearing  HENT:     Head: Normocephalic and atraumatic.     Mouth/Throat:     Mouth: Mucous membranes are moist.  Eyes:     Extraocular Movements: Extraocular movements intact.  Cardiovascular:     Rate and Rhythm: Normal rate and regular rhythm.  Pulmonary:     Effort: Pulmonary effort is normal. No respiratory distress.     Breath sounds: Normal breath sounds. No wheezing.  Abdominal:     General: Bowel sounds are normal. There is no distension.     Palpations: Abdomen is soft.     Tenderness: There is no abdominal tenderness. There is no rebound.  Musculoskeletal:        General: Normal range of motion.     Cervical back: Normal range of motion and neck supple.  Skin:    General: Skin is warm and dry.  Neurological:     General: No focal deficit present.     Comments: Generalized parkinsonian shaking mostly in upper extremities which is chronic and baseline  Psychiatric:        Mood and Affect: Mood normal.      Consultants:    Procedures:    Data Reviewed: Results for orders placed or performed during the hospital encounter of 09/06/23 (from the past 24 hours)  Basic metabolic panel with GFR     Status: Abnormal   Collection Time: 09/09/23   5:29 AM  Result Value Ref Range   Sodium 140 135 - 145 mmol/L   Potassium 3.3 (L) 3.5 - 5.1 mmol/L   Chloride 105 98 - 111 mmol/L   CO2 28 22 - 32 mmol/L   Glucose, Bld 101 (H) 70 - 99 mg/dL   BUN <5 (L) 8 - 23 mg/dL   Creatinine, Ser 4.09 0.44 - 1.00 mg/dL   Calcium 8.6 (L) 8.9 - 10.3 mg/dL   GFR, Estimated >81 >19 mL/min   Anion gap 7 5 - 15  CBC with Differential/Platelet     Status: Abnormal   Collection Time: 09/09/23  5:29 AM  Result Value Ref Range   WBC 7.6 4.0 - 10.5 K/uL   RBC 3.75 (L) 3.87 - 5.11 MIL/uL   Hemoglobin 11.8 (L) 12.0 - 15.0 g/dL   HCT 14.7 82.9 - 56.2 %   MCV 96.8 80.0 - 100.0 fL   MCH 31.5 26.0 - 34.0 pg   MCHC 32.5 30.0 - 36.0 g/dL   RDW 13.0 86.5 - 78.4 %  Platelets 259 150 - 400 K/uL   nRBC 0.0 0.0 - 0.2 %   Neutrophils Relative % 62 %   Neutro Abs 4.7 1.7 - 7.7 K/uL   Lymphocytes Relative 24 %   Lymphs Abs 1.8 0.7 - 4.0 K/uL   Monocytes Relative 8 %   Monocytes Absolute 0.6 0.1 - 1.0 K/uL   Eosinophils Relative 5 %   Eosinophils Absolute 0.4 0.0 - 0.5 K/uL   Basophils Relative 1 %   Basophils Absolute 0.0 0.0 - 0.1 K/uL   Immature Granulocytes 0 %   Abs Immature Granulocytes 0.03 0.00 - 0.07 K/uL  Magnesium     Status: None   Collection Time: 09/09/23  5:29 AM  Result Value Ref Range   Magnesium 1.9 1.7 - 2.4 mg/dL    I have reviewed pertinent nursing notes, vitals, labs, and images as necessary. I have ordered labwork to follow up on as indicated.  I have reviewed the last notes from staff over past 24 hours. I have discussed patient's care plan and test results with nursing staff, CM/SW, and other staff as appropriate.  Time spent: Greater than 50% of the 55 minute visit was spent in counseling/coordination of care for the patient as laid out in the A&P.   LOS: 2 days   Lewie Chamber, MD [facility] 09/09/2023, 12:19 PM

## 2023-09-09 NOTE — Plan of Care (Signed)

## 2023-09-10 DIAGNOSIS — A0811 Acute gastroenteropathy due to Norwalk agent: Secondary | ICD-10-CM | POA: Diagnosis not present

## 2023-09-10 DIAGNOSIS — K5732 Diverticulitis of large intestine without perforation or abscess without bleeding: Secondary | ICD-10-CM | POA: Diagnosis not present

## 2023-09-10 DIAGNOSIS — A0472 Enterocolitis due to Clostridium difficile, not specified as recurrent: Secondary | ICD-10-CM | POA: Diagnosis not present

## 2023-09-10 LAB — CBC WITH DIFFERENTIAL/PLATELET
Abs Immature Granulocytes: 0.03 10*3/uL (ref 0.00–0.07)
Basophils Absolute: 0.1 10*3/uL (ref 0.0–0.1)
Basophils Relative: 1 %
Eosinophils Absolute: 0.5 10*3/uL (ref 0.0–0.5)
Eosinophils Relative: 7 %
HCT: 37.7 % (ref 36.0–46.0)
Hemoglobin: 12.2 g/dL (ref 12.0–15.0)
Immature Granulocytes: 0 %
Lymphocytes Relative: 29 %
Lymphs Abs: 2.1 10*3/uL (ref 0.7–4.0)
MCH: 31.7 pg (ref 26.0–34.0)
MCHC: 32.4 g/dL (ref 30.0–36.0)
MCV: 97.9 fL (ref 80.0–100.0)
Monocytes Absolute: 0.8 10*3/uL (ref 0.1–1.0)
Monocytes Relative: 11 %
Neutro Abs: 3.7 10*3/uL (ref 1.7–7.7)
Neutrophils Relative %: 52 %
Platelets: 279 10*3/uL (ref 150–400)
RBC: 3.85 MIL/uL — ABNORMAL LOW (ref 3.87–5.11)
RDW: 13 % (ref 11.5–15.5)
WBC: 7.1 10*3/uL (ref 4.0–10.5)
nRBC: 0 % (ref 0.0–0.2)

## 2023-09-10 LAB — BASIC METABOLIC PANEL WITH GFR
Anion gap: 9 (ref 5–15)
BUN: <5 mg/dL — ABNORMAL LOW (ref 8–23)
CO2: 25 mmol/L (ref 22–32)
Calcium: 8.7 mg/dL — ABNORMAL LOW (ref 8.9–10.3)
Chloride: 107 mmol/L (ref 98–111)
Creatinine, Ser: 0.82 mg/dL (ref 0.44–1.00)
GFR, Estimated: >60 mL/min (ref >60)
Glucose, Bld: 101 mg/dL — ABNORMAL HIGH (ref 70–99)
Potassium: 3.3 mmol/L — ABNORMAL LOW (ref 3.5–5.1)
Sodium: 141 mmol/L (ref 135–145)

## 2023-09-10 LAB — MAGNESIUM: Magnesium: 1.8 mg/dL (ref 1.7–2.4)

## 2023-09-10 MED ORDER — POTASSIUM CHLORIDE CRYS ER 20 MEQ PO TBCR
40.0000 meq | EXTENDED_RELEASE_TABLET | Freq: Once | ORAL | Status: AC
Start: 2023-09-10 — End: 2023-09-10
  Administered 2023-09-10: 40 meq via ORAL
  Filled 2023-09-10: qty 2

## 2023-09-10 NOTE — Progress Notes (Signed)
 Progress Note    Brooke Coleman   [id number]:[id number]  DOB: 09-05-1950  DOA: 09/06/2023     3 PCP: Willow Ora, MD  Initial CC: Abdominal pain, diarrhea  Hospital Course: Ms. Rapozo is a 73 y.o. female with medical history significant for HTN, HLD, depression/anxiety, parkinsonism who presented with ongoing abdominal pain and diarrhea.  She had recently been treated with Augmentin outpatient for diverticulitis initially on 08/21/2023.  She feels like her symptoms did not improve but her daughter says that she did get a little better on antibiotics prior to getting worse again. Repeat CT abdomen/pelvis was performed on 09/06/2023 showing persistent sigmoid diverticulitis with no signs of complication. She also underwent stool testing given recent antibiotic use and persistent diarrhea.  GI panel was positive for norovirus and C. difficile testing was also positive. She was started on Rocephin and Flagyl on admission for diverticulitis and also started on oral vancomycin upon C. difficile testing being positive.  Interval History:  Still having liquid diarrhea but frequency still improving.  Ideally trying to wait until diarrhea starts to take form and further improves prior to discharge.  Tolerating oral intake well still.  Assessment and Plan: * Diverticulitis of large intestine without perforation or abscess without bleeding - Initially diagnosed around 08/21/2023 and initial CT performed on 08/25/2023; treated with Augmentin outpatient with minimal improvement - Repeat CT abdomen/pelvis performed on admission showing persistent diverticulitis involving descending and proximal sigmoid - Continue Rocephin and Flagyl -Needs outpatient follow-up with GI for colonoscopy in about 6 to 8 weeks - pain almost gone; tolerated soft diet well - d/c IVF  C. difficile colitis - Possible etiology from recent antibiotic use; first episode regardless - continue oral vancomycin -No signs of  complications such as toxic megacolon noted on CT on admission - Monitor clinically for improvement - some confounding of diarrhea with superimposed norovirus infection; stool frequency is starting to decrease some but still type VII stools  Gastroenteritis due to norovirus - Continue fluids and supportive care  Hypokalemia - Replete as needed  Mixed hyperlipidemia - No meds noted at home  Parkinson disease (HCC) - Per med rec, not on medications  Dysthymia - no longer on Zoloft per med rec  Essential hypertension - Can continue amlodipine and Toprol   Old records reviewed in assessment of this patient  Antimicrobials: Rocephin 09/06/2023 >> current Flagyl 09/06/2023 >> current Oral vancomycin 09/07/2023 >> current  DVT prophylaxis:  enoxaparin (LOVENOX) injection 40 mg Start: 09/06/23 2200   Code Status:   Code Status: Full Code  Mobility Assessment (Last 72 Hours)     Mobility Assessment     Row Name 09/10/23 0900 09/09/23 2101 09/09/23 0845 09/08/23 2130 09/08/23 0800   Does patient have an order for bedrest or is patient medically unstable No - Continue assessment No - Continue assessment No - Continue assessment No - Continue assessment No - Continue assessment   What is the highest level of mobility based on the progressive mobility assessment? Level 5 (Walks with assist in room/hall) - Balance while stepping forward/back and can walk in room with assist - Complete Level 5 (Walks with assist in room/hall) - Balance while stepping forward/back and can walk in room with assist - Complete Level 5 (Walks with assist in room/hall) - Balance while stepping forward/back and can walk in room with assist - Complete Level 5 (Walks with assist in room/hall) - Balance while stepping forward/back and can walk in room with assist - Complete Level  5 (Walks with assist in room/hall) - Balance while stepping forward/back and can walk in room with assist - Complete    Row Name 09/07/23  1931           Does patient have an order for bedrest or is patient medically unstable No - Continue assessment       What is the highest level of mobility based on the progressive mobility assessment? Level 5 (Walks with assist in room/hall) - Balance while stepping forward/back and can walk in room with assist - Complete                Barriers to discharge: None Disposition Plan: Home HH orders placed: N/A Status is: Inpatient  Objective: Blood pressure 122/76, pulse 70, temperature 98 F (36.7 C), temperature source Oral, resp. rate 16, height 5\' 10"  (1.778 m), weight 113.6 kg, SpO2 97%.  Examination:  Physical Exam Constitutional:      General: She is not in acute distress.    Appearance: She is not ill-appearing.  HENT:     Head: Normocephalic and atraumatic.     Mouth/Throat:     Mouth: Mucous membranes are moist.  Eyes:     Extraocular Movements: Extraocular movements intact.  Cardiovascular:     Rate and Rhythm: Normal rate and regular rhythm.  Pulmonary:     Effort: Pulmonary effort is normal. No respiratory distress.     Breath sounds: Normal breath sounds. No wheezing.  Abdominal:     General: Bowel sounds are normal. There is no distension.     Palpations: Abdomen is soft.     Tenderness: There is no abdominal tenderness. There is no rebound.  Musculoskeletal:        General: Normal range of motion.     Cervical back: Normal range of motion and neck supple.  Skin:    General: Skin is warm and dry.  Neurological:     General: No focal deficit present.     Mental Status: She is alert.     Comments: Generalized parkinsonian shaking mostly in upper extremities which is chronic and baseline  Psychiatric:        Mood and Affect: Mood normal.      Consultants:    Procedures:    Data Reviewed: Results for orders placed or performed during the hospital encounter of 09/06/23 (from the past 24 hours)  Basic metabolic panel with GFR     Status:  Abnormal   Collection Time: 09/10/23  4:32 AM  Result Value Ref Range   Sodium 141 135 - 145 mmol/L   Potassium 3.3 (L) 3.5 - 5.1 mmol/L   Chloride 107 98 - 111 mmol/L   CO2 25 22 - 32 mmol/L   Glucose, Bld 101 (H) 70 - 99 mg/dL   BUN <5 (L) 8 - 23 mg/dL   Creatinine, Ser 1.61 0.44 - 1.00 mg/dL   Calcium 8.7 (L) 8.9 - 10.3 mg/dL   GFR, Estimated >09 >60 mL/min   Anion gap 9 5 - 15  CBC with Differential/Platelet     Status: Abnormal   Collection Time: 09/10/23  4:32 AM  Result Value Ref Range   WBC 7.1 4.0 - 10.5 K/uL   RBC 3.85 (L) 3.87 - 5.11 MIL/uL   Hemoglobin 12.2 12.0 - 15.0 g/dL   HCT 45.4 09.8 - 11.9 %   MCV 97.9 80.0 - 100.0 fL   MCH 31.7 26.0 - 34.0 pg   MCHC 32.4 30.0 - 36.0 g/dL  RDW 13.0 11.5 - 15.5 %   Platelets 279 150 - 400 K/uL   nRBC 0.0 0.0 - 0.2 %   Neutrophils Relative % 52 %   Neutro Abs 3.7 1.7 - 7.7 K/uL   Lymphocytes Relative 29 %   Lymphs Abs 2.1 0.7 - 4.0 K/uL   Monocytes Relative 11 %   Monocytes Absolute 0.8 0.1 - 1.0 K/uL   Eosinophils Relative 7 %   Eosinophils Absolute 0.5 0.0 - 0.5 K/uL   Basophils Relative 1 %   Basophils Absolute 0.1 0.0 - 0.1 K/uL   Immature Granulocytes 0 %   Abs Immature Granulocytes 0.03 0.00 - 0.07 K/uL  Magnesium     Status: None   Collection Time: 09/10/23  4:32 AM  Result Value Ref Range   Magnesium 1.8 1.7 - 2.4 mg/dL    I have reviewed pertinent nursing notes, vitals, labs, and images as necessary. I have ordered labwork to follow up on as indicated.  I have reviewed the last notes from staff over past 24 hours. I have discussed patient's care plan and test results with nursing staff, CM/SW, and other staff as appropriate.  Time spent: Greater than 50% of the 55 minute visit was spent in counseling/coordination of care for the patient as laid out in the A&P.   LOS: 3 days   Lewie Chamber, MD [facility] 09/10/2023, 2:31 PM

## 2023-09-10 NOTE — Plan of Care (Signed)
   Problem: Education: Goal: Knowledge of General Education information will improve Description: Including pain rating scale, medication(s)/side effects and non-pharmacologic comfort measures Outcome: Progressing   Problem: Nutrition: Goal: Adequate nutrition will be maintained Outcome: Progressing

## 2023-09-10 NOTE — Progress Notes (Signed)
 Pt hospitalized the next day for recurrent diverticulitis: elevated WBC/  Other labs stable Cholesterol is elevated: will need to try a different statin: couldn't tolerate crestor due to side effects. Will initiate after hospital f/u appt.

## 2023-09-10 NOTE — Plan of Care (Signed)

## 2023-09-11 ENCOUNTER — Other Ambulatory Visit (HOSPITAL_COMMUNITY): Payer: Self-pay

## 2023-09-11 DIAGNOSIS — K5732 Diverticulitis of large intestine without perforation or abscess without bleeding: Secondary | ICD-10-CM | POA: Diagnosis not present

## 2023-09-11 DIAGNOSIS — A0811 Acute gastroenteropathy due to Norwalk agent: Secondary | ICD-10-CM | POA: Diagnosis not present

## 2023-09-11 DIAGNOSIS — A0472 Enterocolitis due to Clostridium difficile, not specified as recurrent: Secondary | ICD-10-CM | POA: Diagnosis not present

## 2023-09-11 LAB — BASIC METABOLIC PANEL WITH GFR
Anion gap: 7 (ref 5–15)
BUN: <5 mg/dL — ABNORMAL LOW (ref 8–23)
CO2: 26 mmol/L (ref 22–32)
Calcium: 8.8 mg/dL — ABNORMAL LOW (ref 8.9–10.3)
Chloride: 107 mmol/L (ref 98–111)
Creatinine, Ser: 0.63 mg/dL (ref 0.44–1.00)
GFR, Estimated: >60 mL/min (ref >60)
Glucose, Bld: 88 mg/dL (ref 70–99)
Potassium: 3.7 mmol/L (ref 3.5–5.1)
Sodium: 140 mmol/L (ref 135–145)

## 2023-09-11 LAB — MAGNESIUM: Magnesium: 1.7 mg/dL (ref 1.7–2.4)

## 2023-09-11 LAB — CULTURE, BLOOD (ROUTINE X 2)
Culture: NO GROWTH
Culture: NO GROWTH
Special Requests: ADEQUATE
Special Requests: ADEQUATE

## 2023-09-11 MED ORDER — CIPROFLOXACIN HCL 500 MG PO TABS
500.0000 mg | ORAL_TABLET | Freq: Two times a day (BID) | ORAL | Status: DC
  Administered 2023-09-11: 500 mg via ORAL
  Filled 2023-09-11: qty 1

## 2023-09-11 MED ORDER — METRONIDAZOLE 500 MG PO TABS
500.0000 mg | ORAL_TABLET | Freq: Two times a day (BID) | ORAL | 0 refills | Status: AC
  Filled 2023-09-11: qty 18, 9d supply, fill #0

## 2023-09-11 MED ORDER — METRONIDAZOLE 500 MG PO TABS: 500.0000 mg | ORAL_TABLET | Freq: Two times a day (BID) | ORAL | Status: DC

## 2023-09-11 MED ORDER — VANCOMYCIN HCL 125 MG PO CAPS
125.0000 mg | ORAL_CAPSULE | Freq: Four times a day (QID) | ORAL | 0 refills | Status: AC
  Filled 2023-09-11: qty 48, 12d supply, fill #0

## 2023-09-11 MED ORDER — CIPROFLOXACIN HCL 500 MG PO TABS
500.0000 mg | ORAL_TABLET | Freq: Two times a day (BID) | ORAL | 0 refills | Status: AC
  Filled 2023-09-11: qty 18, 9d supply, fill #0

## 2023-09-11 NOTE — Progress Notes (Signed)
 Daughter at bedside for discharges instructions questions asked and answered D Susann Givens RN

## 2023-09-11 NOTE — Progress Notes (Signed)
 Mobility Specialist - Progress Note   09/11/23 0849  Mobility  Activity Ambulated with assistance in hallway  Level of Assistance Standby assist, set-up cues, supervision of patient - no hands on  Assistive Device Front wheel walker  Distance Ambulated (ft) 600 ft  Activity Response Tolerated well  Mobility Referral Yes  Mobility visit 1 Mobility  Mobility Specialist Start Time (ACUTE ONLY) [id number]  Mobility Specialist Stop Time (ACUTE ONLY) 0848  Mobility Specialist Time Calculation (min) (ACUTE ONLY) 15 min   Pt received in bed and agreeable to mobility. No complaints during session. Pt to bed after session with all needs met.    [facility]

## 2023-09-11 NOTE — Progress Notes (Signed)
 Discharge instructions given to patient. Discharge meds delivered to bedside D [facility]

## 2023-09-11 NOTE — Discharge Summary (Signed)
 Physician Discharge Summary   Brooke Coleman ZOX:[id number] DOB: 1950-06-21 DOA: 09/06/2023  PCP: Willow Ora, MD  Admit date: 09/06/2023 Discharge date: 09/11/2023   Admitted From: Home  Disposition:  Home  Discharging physician: Lewie Chamber, MD Barriers to discharge: none  Recommendations at discharge: Follow up with GI for colonoscopy in 6-8 weeks  Discharge Condition: stable CODE STATUS: Full  Diet recommendation:  Diet Orders (From admission, onward)     Start     Ordered   09/11/23 0000  Diet general        09/11/23 1134   09/09/23 1027  Diet regular Fluid consistency: Thin  Diet effective now       Question:  Fluid consistency:  Answer:  Thin   09/09/23 1026            Hospital Course: Ms. Toops is a 73 y.o. female with medical history significant for HTN, HLD, depression/anxiety, parkinsonism who presented with ongoing abdominal pain and diarrhea.  She had recently been treated with Augmentin outpatient for diverticulitis initially on 08/21/2023.  She feels like her symptoms did not improve but her daughter says that she did get a little better on antibiotics prior to getting worse again. Repeat CT abdomen/pelvis was performed on 09/06/2023 showing persistent sigmoid diverticulitis with no signs of complication. She also underwent stool testing given recent antibiotic use and persistent diarrhea.  GI panel was positive for norovirus and C. difficile testing was also positive. She was started on Rocephin and Flagyl on admission for diverticulitis and also started on oral vancomycin upon C. difficile testing being positive. Diarrhea was slow to resolve, but showed some improvement prior to discharge.  Assessment and Plan: * Diverticulitis of large intestine without perforation or abscess without bleeding - Initially diagnosed around 08/21/2023 and initial CT performed on 08/25/2023; treated with Augmentin outpatient with minimal improvement - Repeat CT  abdomen/pelvis performed on admission showing persistent diverticulitis involving descending and proximal sigmoid - Continue Rocephin and Flagyl -Needs outpatient follow-up with GI for colonoscopy in about 6 to 8 weeks - pain almost gone; tolerated soft diet well - d/c IVF  C. difficile colitis - Possible etiology from recent antibiotic use; first episode regardless - continue oral vancomycin -No signs of complications such as toxic megacolon noted on CT on admission - Monitor clinically for improvement - some confounding of diarrhea with superimposed norovirus infection; she was Hospitalized until stools began taking form which was achieved on 09/11/2023 and she felt comfortable with discharging home -Oral vancomycin continued while on antibiotics for diverticulitis with overlapping bridge to extend after diverticulitis antibiotics completed  Gastroenteritis due to norovirus - s/p fluids and supportive care  Hypokalemia - Replete as needed  Mixed hyperlipidemia - No meds noted at home  Parkinson disease (HCC) - Per med rec, not on medications  Dysthymia - no longer on Zoloft per med rec  Essential hypertension - Can continue amlodipine and Toprol   The patient's acute and chronic medical conditions were treated accordingly. On day of discharge, patient was felt deemed stable for discharge. Patient/family member advised to call PCP or come back to ER if needed.   Principal Diagnosis: Diverticulitis of large intestine without perforation or abscess without bleeding  Discharge Diagnoses: Active Hospital Problems   Diagnosis Date Noted   Diverticulitis of large intestine without perforation or abscess without bleeding 09/06/2023    Priority: 1.   C. difficile colitis 09/07/2023    Priority: 2.   Gastroenteritis due to norovirus 09/07/2023  Priority: 3.   Diverticulitis 09/07/2023   Hypokalemia 09/06/2023   Mixed hyperlipidemia 09/05/2023   Parkinson disease (HCC)  09/21/2022   Essential hypertension 11/07/2016   Dysthymia 11/07/2016    Resolved Hospital Problems  No resolved problems to display.     Discharge Instructions     Diet general   Complete by: As directed    Increase activity slowly   Complete by: As directed       Allergies as of 09/11/2023       Reactions   Cortisone Anaphylaxis   Erythromycin Anaphylaxis   Neurontin [gabapentin] Swelling   Crestor [rosuvastatin] Other (See Comments)   Made her feel funny; declines further statins   Egg-derived Products    Iodinated Contrast Media Nausea And Vomiting   (Can do dye-less)   Prednisone Other (See Comments)   Pinching in chest   Flexeril [cyclobenzaprine]    "pinching in chest"   Naproxen    "pinching in chest"        Medication List     TAKE these medications    albuterol 108 (90 Base) MCG/ACT inhaler Commonly known as: VENTOLIN HFA Inhale 2 puffs into the lungs every 6 (six) hours as needed for wheezing or shortness of breath.   amitriptyline 25 MG tablet Commonly known as: ELAVIL Take 1 tablet (25 mg total) by mouth at bedtime.   amLODipine 10 MG tablet Commonly known as: NORVASC Take 1 tablet (10 mg total) by mouth daily.   aspirin 81 MG tablet Take 81 mg by mouth daily.   carbidopa-levodopa 25-100 MG tablet Commonly known as: SINEMET IR Take 1 tablet by mouth 3 (three) times daily before meals.   ciprofloxacin 500 MG tablet Commonly known as: CIPRO Take 1 tablet (500 mg total) by mouth 2 (two) times daily for 9 days.   EPINEPHrine 0.3 mg/0.3 mL Soaj injection Commonly known as: EPI-PEN Inject 0.3 mg into the muscle as needed for anaphylaxis.   ibuprofen 800 MG tablet Commonly known as: ADVIL Take 1 tablet (800 mg total) by mouth every 8 (eight) hours as needed for moderate pain (with food).   levocetirizine 5 MG tablet Commonly known as: Xyzal Allergy 24HR Take 1 tablet (5 mg total) by mouth every evening.   metoprolol succinate 50 MG 24  hr tablet Commonly known as: TOPROL-XL Take 1 tablet by mouth once daily   metroNIDAZOLE 500 MG tablet Commonly known as: FLAGYL Take 1 tablet (500 mg total) by mouth 2 (two) times daily for 9 days.   sertraline 100 MG tablet Commonly known as: ZOLOFT Take 1 tablet (100 mg total) by mouth daily.   vancomycin 125 MG capsule Commonly known as: VANCOCIN Take 1 capsule (125 mg total) by mouth 4 (four) times daily for 12 days.        Allergies  Allergen Reactions   Cortisone Anaphylaxis   Erythromycin Anaphylaxis   Neurontin [Gabapentin] Swelling   Crestor [Rosuvastatin] Other (See Comments)    Made her feel funny; declines further statins   Egg-Derived Products    Iodinated Contrast Media Nausea And Vomiting    (Can do dye-less)   Prednisone Other (See Comments)    Pinching in chest   Flexeril [Cyclobenzaprine]     "pinching in chest"   Naproxen     "pinching in chest"    Consultations:   Procedures:   Discharge Exam: BP (!) 134/92 (BP Location: Left Arm)   Pulse 73   Temp 97.8 F (36.6 C)  Resp 18   Ht 5\' 10"  (1.778 m)   Wt 113.6 kg   SpO2 97%   BMI 35.93 kg/m  Physical Exam Constitutional:      General: She is not in acute distress.    Appearance: She is not ill-appearing.  HENT:     Head: Normocephalic and atraumatic.     Mouth/Throat:     Mouth: Mucous membranes are moist.  Eyes:     Extraocular Movements: Extraocular movements intact.  Cardiovascular:     Rate and Rhythm: Normal rate and regular rhythm.  Pulmonary:     Effort: Pulmonary effort is normal. No respiratory distress.     Breath sounds: Normal breath sounds. No wheezing.  Abdominal:     General: Bowel sounds are normal. There is no distension.     Palpations: Abdomen is soft.     Tenderness: There is no abdominal tenderness. There is no rebound.  Musculoskeletal:        General: Normal range of motion.     Cervical back: Normal range of motion and neck supple.  Skin:     General: Skin is warm and dry.  Neurological:     General: No focal deficit present.     Mental Status: She is alert.     Comments: Generalized parkinsonian shaking mostly in upper extremities which is chronic and baseline  Psychiatric:        Mood and Affect: Mood normal.      The results of significant diagnostics from this hospitalization (including imaging, microbiology, ancillary and laboratory) are listed below for reference.   Microbiology: Recent Results (from the past 240 hours)  C Difficile Quick Screen w PCR reflex     Status: Abnormal   Collection Time: 09/06/23  3:16 PM   Specimen: STOOL  Result Value Ref Range Status   C Diff antigen POSITIVE (A) NEGATIVE Final   C Diff toxin POSITIVE (A) NEGATIVE Final   C Diff interpretation Toxin producing C. difficile detected.  Final    Comment: CRITICAL RESULT CALLED TO, READ BACK BY AND VERIFIED WITH: RN Shirleen Schirmer AT 1006 09/07/23 CRUICKSHANK A Performed at [facility], [address], [place], [place] [address]   Blood culture (routine x 2)     Status: None   Collection Time: 09/06/23  4:31 PM   Specimen: BLOOD  Result Value Ref Range Status   Specimen Description   Final    BLOOD RIGHT ANTECUBITAL Performed at [facility], [address] [place], [place] [address]    Special Requests   Final    BOTTLES DRAWN AEROBIC AND ANAEROBIC Blood Culture adequate volume Performed at [facility], [address], [place], [place] [address]    Culture   Final    NO GROWTH 5 DAYS Performed at [facility], [address] [place], [place] [address]    Report Status 09/11/2023 FINAL  Final  Resp panel by RT-PCR (RSV, Flu A&B, Covid) Anterior Nasal Swab     Status: None   Collection Time: 09/06/23  5:08 PM   Specimen: Anterior Nasal Swab  Result Value Ref Range Status   SARS Coronavirus 2 by RT PCR NEGATIVE NEGATIVE Final    Comment: (NOTE) SARS-CoV-2 target  nucleic acids are NOT DETECTED.  The SARS-CoV-2 RNA is generally detectable in upper respiratory specimens during the acute phase of infection. The lowest concentration of SARS-CoV-2 viral copies this assay can detect is 138 copies/mL. A negative result does  not preclude SARS-Cov-2 infection and should not be used as the sole basis for treatment or other patient management decisions. A negative result may occur with  improper specimen collection/handling, submission of specimen other than nasopharyngeal swab, presence of viral mutation(s) within the areas targeted by this assay, and inadequate number of viral copies(<138 copies/mL). A negative result must be combined with clinical observations, patient history, and epidemiological information. The expected result is Negative.  Fact Sheet for Patients:  BloggerCourse.com  Fact Sheet for Healthcare Providers:  SeriousBroker.it  This test is no t yet approved or cleared by the [place] FDA and  has been authorized for detection and/or diagnosis of SARS-CoV-2 by FDA under an Emergency Use Authorization (EUA). This EUA will remain  in effect (meaning this test can be used) for the duration of the COVID-19 declaration under Section 564(b)(1) of the Act, 21 U.S.C.section 360bbb-3(b)(1), unless the authorization is terminated  or revoked sooner.       Influenza A by PCR NEGATIVE NEGATIVE Final   Influenza B by PCR NEGATIVE NEGATIVE Final    Comment: (NOTE) The Xpert Xpress SARS-CoV-2/FLU/RSV plus assay is intended as an aid in the diagnosis of influenza from Nasopharyngeal swab specimens and should not be used as a sole basis for treatment. Nasal washings and aspirates are unacceptable for Xpert Xpress SARS-CoV-2/FLU/RSV testing.  Fact Sheet for Patients: BloggerCourse.com  Fact Sheet for Healthcare  Providers: SeriousBroker.it  This test is not yet approved or cleared by the [place] FDA and has been authorized for detection and/or diagnosis of SARS-CoV-2 by FDA under an Emergency Use Authorization (EUA). This EUA will remain in effect (meaning this test can be used) for the duration of the COVID-19 declaration under Section 564(b)(1) of the Act, 21 U.S.C. section 360bbb-3(b)(1), unless the authorization is terminated or revoked.     Resp Syncytial Virus by PCR NEGATIVE NEGATIVE Final    Comment: (NOTE) Fact Sheet for Patients: BloggerCourse.com  Fact Sheet for Healthcare Providers: SeriousBroker.it  This test is not yet approved or cleared by the [place] FDA and has been authorized for detection and/or diagnosis of SARS-CoV-2 by FDA under an Emergency Use Authorization (EUA). This EUA will remain in effect (meaning this test can be used) for the duration of the COVID-19 declaration under Section 564(b)(1) of the Act, 21 U.S.C. section 360bbb-3(b)(1), unless the authorization is terminated or revoked.  Performed at [facility], [address], [place], [place] [address]   Blood culture (routine x 2)     Status: None   Collection Time: 09/06/23  9:05 PM   Specimen: BLOOD LEFT ARM  Result Value Ref Range Status   Specimen Description   Final    BLOOD LEFT ARM Performed at [facility], [address] [place], [place] [address]    Special Requests   Final    BOTTLES DRAWN AEROBIC AND ANAEROBIC Blood Culture adequate volume Performed at [facility], [address] [place], [place] [address]    Culture   Final    NO GROWTH 5 DAYS Performed at [facility], [address], [place], [place] [address]    Report Status 09/11/2023 FINAL  Final  Gastrointestinal Panel by PCR , Stool     Status: Abnormal   Collection Time: 09/06/23 11:26  PM   Specimen: Stool  Result Value Ref Range Status   Campylobacter species NOT DETECTED NOT DETECTED Final   Plesimonas shigelloides NOT DETECTED NOT DETECTED Final  Salmonella species NOT DETECTED NOT DETECTED Final   Yersinia enterocolitica NOT DETECTED NOT DETECTED Final   Vibrio species NOT DETECTED NOT DETECTED Final   Vibrio cholerae NOT DETECTED NOT DETECTED Final   Enteroaggregative E coli (EAEC) NOT DETECTED NOT DETECTED Final   Enteropathogenic E coli (EPEC) NOT DETECTED NOT DETECTED Final   Enterotoxigenic E coli (ETEC) NOT DETECTED NOT DETECTED Final   Shiga like toxin producing E coli (STEC) NOT DETECTED NOT DETECTED Final   Shigella/Enteroinvasive E coli (EIEC) NOT DETECTED NOT DETECTED Final   Cryptosporidium NOT DETECTED NOT DETECTED Final   Cyclospora cayetanensis NOT DETECTED NOT DETECTED Final   Entamoeba histolytica NOT DETECTED NOT DETECTED Final   Giardia lamblia NOT DETECTED NOT DETECTED Final   Adenovirus F40/41 NOT DETECTED NOT DETECTED Final   Astrovirus NOT DETECTED NOT DETECTED Final   Norovirus GI/GII DETECTED (A) NOT DETECTED Final    Comment: RESULT CALLED TO, READ BACK BY AND VERIFIED WITH: SUSAN KUSNITZ 09/07/2023 AT 0925 SRR    Rotavirus A NOT DETECTED NOT DETECTED Final   Sapovirus (I, II, IV, and V) NOT DETECTED NOT DETECTED Final    Comment: Performed at [facility], [address] [place], [place] [address]     Labs: BNP (last 3 results) No results for input(s): "BNP" in the last 8760 hours. Basic Metabolic Panel: Recent Labs  Lab 09/07/23 0421 09/08/23 0536 09/09/23 0529 09/10/23 0432 09/11/23 0457  NA 136 139 140 141 140  K 3.4* 3.6 3.3* 3.3* 3.7  CL 105 106 105 107 107  CO2 20* 23 28 25 26   GLUCOSE 121* 100* 101* 101* 88  BUN 12 8 <5* <5* <5*  CREATININE 1.01* 0.75 0.73 0.82 0.63  CALCIUM 8.3* 8.5* 8.6* 8.7* 8.8*  MG  --  1.8 1.9 1.8 1.7   Liver Function Tests: Recent Labs  Lab 09/05/23 1410 09/06/23 1631  09/07/23 0421  AST 16 15 20   ALT 13 12 17   ALKPHOS 82 66 59  BILITOT 0.8 1.4* 1.1  PROT 8.2 7.1 6.8  ALBUMIN 4.5 4.0 3.3*   Recent Labs  Lab 09/06/23 1631  LIPASE <10*   No results for input(s): "AMMONIA" in the last 168 hours. CBC: Recent Labs  Lab 09/05/23 1410 09/06/23 1631 09/07/23 0421 09/08/23 0536 09/09/23 0529 09/10/23 0432  WBC 12.4* 16.8* 16.0* 11.6* 7.6 7.1  NEUTROABS 9.8* 14.1*  --  8.1* 4.7 3.7  HGB 14.8 13.3 12.2 11.4* 11.8* 12.2  HCT 43.6 38.9 38.0 35.8* 36.3 37.7  MCV 92.9 91.7 97.7 98.1 96.8 97.9  PLT 318.0 273 235 199 259 279   Cardiac Enzymes: No results for input(s): "CKTOTAL", "CKMB", "CKMBINDEX", "TROPONINI" in the last 168 hours. BNP: Invalid input(s): "POCBNP" CBG: No results for input(s): "GLUCAP" in the last 168 hours. D-Dimer No results for input(s): "DDIMER" in the last 72 hours. Hgb A1c No results for input(s): "HGBA1C" in the last 72 hours. Lipid Profile No results for input(s): "CHOL", "HDL", "LDLCALC", "TRIG", "CHOLHDL", "LDLDIRECT" in the last 72 hours. Thyroid function studies No results for input(s): "TSH", "T4TOTAL", "T3FREE", "THYROIDAB" in the last 72 hours.  Invalid input(s): "FREET3" Anemia work up No results for input(s): "VITAMINB12", "FOLATE", "FERRITIN", "TIBC", "IRON", "RETICCTPCT" in the last 72 hours. Urinalysis    Component Value Date/Time   COLORURINE YELLOW 08/25/2023 2132   APPEARANCEUR HAZY (A) 08/25/2023 2132   LABSPEC 1.033 (H) 08/25/2023 2132   PHURINE 5.5 08/25/2023 2132   GLUCOSEU NEGATIVE 08/25/2023 2132   HGBUR  MODERATE (A) 08/25/2023 2132   BILIRUBINUR NEGATIVE 08/25/2023 2132   KETONESUR NEGATIVE 08/25/2023 2132   PROTEINUR 30 (A) 08/25/2023 2132   NITRITE NEGATIVE 08/25/2023 2132   LEUKOCYTESUR MODERATE (A) 08/25/2023 2132   Sepsis Labs Recent Labs  Lab 09/07/23 0421 09/08/23 0536 09/09/23 0529 09/10/23 0432  WBC 16.0* 11.6* 7.6 7.1   Microbiology Recent Results (from the past 240  hours)  C Difficile Quick Screen w PCR reflex     Status: Abnormal   Collection Time: 09/06/23  3:16 PM   Specimen: STOOL  Result Value Ref Range Status   C Diff antigen POSITIVE (A) NEGATIVE Final   C Diff toxin POSITIVE (A) NEGATIVE Final   C Diff interpretation Toxin producing C. difficile detected.  Final    Comment: CRITICAL RESULT CALLED TO, READ BACK BY AND VERIFIED WITH: RN Shirleen Schirmer AT 1006 09/07/23 CRUICKSHANK A Performed at [facility], [address] [place], [place] [address]   Blood culture (routine x 2)     Status: None   Collection Time: 09/06/23  4:31 PM   Specimen: BLOOD  Result Value Ref Range Status   Specimen Description   Final    BLOOD RIGHT ANTECUBITAL Performed at [facility], [address] [place], [place] [address]    Special Requests   Final    BOTTLES DRAWN AEROBIC AND ANAEROBIC Blood Culture adequate volume Performed at [facility], [address] [place], [place] [address]    Culture   Final    NO GROWTH 5 DAYS Performed at [facility], [address], [place], [place] [address]    Report Status 09/11/2023 FINAL  Final  Resp panel by RT-PCR (RSV, Flu A&B, Covid) Anterior Nasal Swab     Status: None   Collection Time: 09/06/23  5:08 PM   Specimen: Anterior Nasal Swab  Result Value Ref Range Status   SARS Coronavirus 2 by RT PCR NEGATIVE NEGATIVE Final    Comment: (NOTE) SARS-CoV-2 target nucleic acids are NOT DETECTED.  The SARS-CoV-2 RNA is generally detectable in upper respiratory specimens during the acute phase of infection. The lowest concentration of SARS-CoV-2 viral copies this assay can detect is 138 copies/mL. A negative result does not preclude SARS-Cov-2 infection and should not be used as the sole basis for treatment or other patient management decisions. A negative result may occur with  improper specimen collection/handling, submission of specimen other than  nasopharyngeal swab, presence of viral mutation(s) within the areas targeted by this assay, and inadequate number of viral copies(<138 copies/mL). A negative result must be combined with clinical observations, patient history, and epidemiological information. The expected result is Negative.  Fact Sheet for Patients:  BloggerCourse.com  Fact Sheet for Healthcare Providers:  SeriousBroker.it  This test is no t yet approved or cleared by the [place] FDA and  has been authorized for detection and/or diagnosis of SARS-CoV-2 by FDA under an Emergency Use Authorization (EUA). This EUA will remain  in effect (meaning this test can be used) for the duration of the COVID-19 declaration under Section 564(b)(1) of the Act, 21 U.S.C.section 360bbb-3(b)(1), unless the authorization is terminated  or revoked sooner.       Influenza A by PCR NEGATIVE NEGATIVE Final   Influenza B by PCR NEGATIVE NEGATIVE Final    Comment: (NOTE) The Xpert Xpress SARS-CoV-2/FLU/RSV plus assay is intended as an aid in the diagnosis of influenza from Nasopharyngeal swab specimens and should not be used  as a sole basis for treatment. Nasal washings and aspirates are unacceptable for Xpert Xpress SARS-CoV-2/FLU/RSV testing.  Fact Sheet for Patients: BloggerCourse.com  Fact Sheet for Healthcare Providers: SeriousBroker.it  This test is not yet approved or cleared by the [place] FDA and has been authorized for detection and/or diagnosis of SARS-CoV-2 by FDA under an Emergency Use Authorization (EUA). This EUA will remain in effect (meaning this test can be used) for the duration of the COVID-19 declaration under Section 564(b)(1) of the Act, 21 U.S.C. section 360bbb-3(b)(1), unless the authorization is terminated or revoked.     Resp Syncytial Virus by PCR NEGATIVE NEGATIVE Final    Comment:  (NOTE) Fact Sheet for Patients: BloggerCourse.com  Fact Sheet for Healthcare Providers: SeriousBroker.it  This test is not yet approved or cleared by the [place] FDA and has been authorized for detection and/or diagnosis of SARS-CoV-2 by FDA under an Emergency Use Authorization (EUA). This EUA will remain in effect (meaning this test can be used) for the duration of the COVID-19 declaration under Section 564(b)(1) of the Act, 21 U.S.C. section 360bbb-3(b)(1), unless the authorization is terminated or revoked.  Performed at [facility], [address] [place], [place] [address]   Blood culture (routine x 2)     Status: None   Collection Time: 09/06/23  9:05 PM   Specimen: BLOOD LEFT ARM  Result Value Ref Range Status   Specimen Description   Final    BLOOD LEFT ARM Performed at [facility], [address], [place], [place] [address]    Special Requests   Final    BOTTLES DRAWN AEROBIC AND ANAEROBIC Blood Culture adequate volume Performed at [facility], [address], [place], [place] [address]    Culture   Final    NO GROWTH 5 DAYS Performed at [facility], [address], [place], [place] [address]    Report Status 09/11/2023 FINAL  Final  Gastrointestinal Panel by PCR , Stool     Status: Abnormal   Collection Time: 09/06/23 11:26 PM   Specimen: Stool  Result Value Ref Range Status   Campylobacter species NOT DETECTED NOT DETECTED Final   Plesimonas shigelloides NOT DETECTED NOT DETECTED Final   Salmonella species NOT DETECTED NOT DETECTED Final   Yersinia enterocolitica NOT DETECTED NOT DETECTED Final   Vibrio species NOT DETECTED NOT DETECTED Final   Vibrio cholerae NOT DETECTED NOT DETECTED Final   Enteroaggregative E coli (EAEC) NOT DETECTED NOT DETECTED Final   Enteropathogenic E coli (EPEC) NOT DETECTED NOT DETECTED Final   Enterotoxigenic E coli (ETEC)  NOT DETECTED NOT DETECTED Final   Shiga like toxin producing E coli (STEC) NOT DETECTED NOT DETECTED Final   Shigella/Enteroinvasive E coli (EIEC) NOT DETECTED NOT DETECTED Final   Cryptosporidium NOT DETECTED NOT DETECTED Final   Cyclospora cayetanensis NOT DETECTED NOT DETECTED Final   Entamoeba histolytica NOT DETECTED NOT DETECTED Final   Giardia lamblia NOT DETECTED NOT DETECTED Final   Adenovirus F40/41 NOT DETECTED NOT DETECTED Final   Astrovirus NOT DETECTED NOT DETECTED Final   Norovirus GI/GII DETECTED (A) NOT DETECTED Final    Comment: RESULT CALLED TO, READ BACK BY AND VERIFIED WITH: SUSAN KUSNITZ 09/07/2023 AT 0925 SRR    Rotavirus A NOT DETECTED NOT DETECTED Final   Sapovirus (I, II, IV, and V) NOT DETECTED NOT DETECTED Final    Comment: Performed at [facility], [address] [place], [place] [address]  Procedures/Studies: CT ABDOMEN PELVIS WO CONTRAST Result Date: 09/06/2023 CLINICAL DATA:  Acute left lower quadrant abdominal pain. History of sigmoid diverticulitis. EXAM: CT ABDOMEN AND PELVIS WITHOUT CONTRAST TECHNIQUE: Multidetector CT imaging of the abdomen and pelvis was performed following the standard protocol without IV contrast. RADIATION DOSE REDUCTION: This exam was performed according to the departmental dose-optimization program which includes automated exposure control, adjustment of the mA and/or kV according to patient size and/or use of iterative reconstruction technique. COMPARISON:  August 25, 2023. FINDINGS: Lower chest: No acute abnormality. Hepatobiliary: Hepatic steatosis. Large solitary gallstone is noted. No cholelithiasis. Pancreas: Unremarkable. No pancreatic ductal dilatation or surrounding inflammatory changes. Spleen: Normal in size without focal abnormality. Adrenals/Urinary Tract: Adrenal glands are unremarkable. Kidneys are normal, without renal calculi, focal lesion, or hydronephrosis. Bladder is unremarkable. Stomach/Bowel: The  stomach is unremarkable. There is no evidence of bowel obstruction. The appendix appears normal. Inflammatory changes are seen involving the distal descending and proximal sigmoid colon consistent with diverticulitis. No abscess formation is noted. Vascular/Lymphatic: Aortic atherosclerosis. No enlarged abdominal or pelvic lymph nodes. Reproductive: Uterus and bilateral adnexa are unremarkable. Other: No abdominal wall hernia or abnormality. No abdominopelvic ascites. Musculoskeletal: No acute or significant osseous findings. IMPRESSION: Findings consistent with diverticulitis involving the distal descending and proximal sigmoid colon. Hepatic steatosis. Large solitary gallstone. Aortic Atherosclerosis (ICD10-I70.0). Electronically Signed   By: Lupita Raider M.D.   On: 09/06/2023 17:28   CT ABDOMEN PELVIS WO CONTRAST Result Date: 08/25/2023 CLINICAL DATA:  Abdominal pain, acute, non localized, vomiting, diarrhea EXAM: CT ABDOMEN AND PELVIS WITHOUT CONTRAST TECHNIQUE: Multidetector CT imaging of the abdomen and pelvis was performed following the standard protocol without IV contrast. RADIATION DOSE REDUCTION: This exam was performed according to the departmental dose-optimization program which includes automated exposure control, adjustment of the mA and/or kV according to patient size and/or use of iterative reconstruction technique. COMPARISON:  None Available. FINDINGS: Lower chest: No acute abnormality. Hepatobiliary: Unremarkable liver. Cholelithiasis. No evidence of acute cholecystitis. No biliary dilation. Pancreas: Unremarkable. Spleen: Unremarkable. Adrenals/Urinary Tract: Normal adrenal glands. No urinary calculi or hydronephrosis. Bladder is unremarkable. Stomach/Bowel: Normal caliber large and small bowel. Descending and sigmoid colon diverticulosis. There is mild wall thickening and adjacent inflammatory stranding about the proximal sigmoid colon. The appendix is normal.Stomach is within normal  limits. Vascular/Lymphatic: No significant vascular findings are present. No enlarged abdominal or pelvic lymph nodes. Reproductive: Unremarkable. Other: No abscess or free intraperitoneal air. Musculoskeletal: No acute fracture. IMPRESSION: 1. Acute uncomplicated sigmoid diverticulitis. Consider follow-up colonoscopy following resolution of patient's acute symptoms to exclude underlying mass. Electronically Signed   By: Minerva Fester M.D.   On: 08/25/2023 21:40     Time coordinating discharge: Over 30 minutes    Lewie Chamber, MD  [facility] 09/11/2023, 2:11 PM

## 2023-09-11 NOTE — Plan of Care (Signed)

## 2023-09-25 ENCOUNTER — Ambulatory Visit: Payer: Medicare HMO | Admitting: Diagnostic Neuroimaging

## 2023-10-12 ENCOUNTER — Other Ambulatory Visit: Payer: Self-pay | Admitting: Diagnostic Neuroimaging

## 2023-10-12 NOTE — Telephone Encounter (Signed)
 Last seen on 03/28/23 Follow up scheduled on 12/10/23

## 2023-12-10 ENCOUNTER — Telehealth: Payer: Self-pay | Admitting: Diagnostic Neuroimaging

## 2023-12-10 ENCOUNTER — Ambulatory Visit: Payer: Medicare HMO | Admitting: Diagnostic Neuroimaging

## 2023-12-10 NOTE — Telephone Encounter (Signed)
 LVM and sent MyChart msg informing pt of r/s needed for today's appt- MD out.

## 2023-12-17 ENCOUNTER — Telehealth: Payer: Self-pay | Admitting: Diagnostic Neuroimaging

## 2023-12-17 NOTE — Telephone Encounter (Signed)
 LVM and sent mychart msg informing pt of need to reschedule 02/15/24 appt - MD out

## 2023-12-18 ENCOUNTER — Other Ambulatory Visit: Payer: Self-pay | Admitting: Family Medicine

## 2024-02-13 ENCOUNTER — Other Ambulatory Visit: Payer: Self-pay

## 2024-02-13 ENCOUNTER — Ambulatory Visit

## 2024-02-13 VITALS — Ht 70.0 in | Wt 246.0 lb

## 2024-02-13 DIAGNOSIS — Z1231 Encounter for screening mammogram for malignant neoplasm of breast: Secondary | ICD-10-CM

## 2024-02-13 DIAGNOSIS — Z0000 Encounter for general adult medical examination without abnormal findings: Secondary | ICD-10-CM | POA: Diagnosis not present

## 2024-02-13 DIAGNOSIS — Z1211 Encounter for screening for malignant neoplasm of colon: Secondary | ICD-10-CM

## 2024-02-13 NOTE — Progress Notes (Signed)
 Subjective:   Brooke Coleman is a 73 y.o. who presents for a Medicare Wellness preventive visit.  As a reminder, Annual Wellness Visits don't include a physical exam, and some assessments may be limited, especially if this visit is performed virtually. We may recommend an in-person follow-up visit with your provider if needed.  Visit Complete: Virtual I connected with  Brooke Coleman on 02/13/24 by a audio enabled telemedicine application and verified that I am speaking with the correct person using two identifiers.  Patient Location: Home  Provider Location: Home Office  I discussed the limitations of evaluation and management by telemedicine. The patient expressed understanding and agreed to proceed.  Vital Signs: Because this visit was a virtual/telehealth visit, some criteria may be missing or patient reported. Any vitals not documented were not able to be obtained and vitals that have been documented are patient reported.  VideoDeclined- This patient declined Librarian, academic. Therefore the visit was completed with audio only.  Persons Participating in Visit: Patient.  AWV Questionnaire: Yes: Patient Medicare AWV questionnaire was completed by the patient on 02/09/24; I have confirmed that all information answered by patient is correct and no changes since this date.  Cardiac Risk Factors include: advanced age (>91men, >63 women);dyslipidemia;hypertension;obesity (BMI >30kg/m2)     Objective:    Today's Vitals   02/09/24 1758 02/13/24 1534  Weight:  246 lb (111.6 kg)  Height:  5' 10 (1.778 m)  PainSc: 8     Body mass index is 35.3 kg/m.     02/13/2024    3:42 PM 09/06/2023    8:28 PM 09/06/2023    3:35 PM 08/25/2023    7:24 PM 12/29/2022    7:00 PM 08/15/2022   11:45 AM 07/17/2022   12:47 AM  Advanced Directives  Does Patient Have a Medical Advance Directive? No  No No No No No  Would patient like information on creating a medical advance  directive? No - Patient declined No - Patient declined  No - Patient declined No - Patient declined No - Patient declined No - Patient declined    Current Medications (verified) Outpatient Encounter Medications as of 02/13/2024  Medication Sig   albuterol  (VENTOLIN  HFA) 108 (90 Base) MCG/ACT inhaler Inhale 2 puffs into the lungs every 6 (six) hours as needed for wheezing or shortness of breath.   amitriptyline  (ELAVIL ) 25 MG tablet Take 1 tablet (25 mg total) by mouth at bedtime.   amLODipine  (NORVASC ) 10 MG tablet Take 1 tablet (10 mg total) by mouth daily.   aspirin 81 MG tablet Take 81 mg by mouth daily.   carbidopa -levodopa  (SINEMET  IR) 25-100 MG tablet TAKE 1 TABLET BY MOUTH THREE TIMES DAILY BEFORE MEAL(S)   ibuprofen  (ADVIL ) 800 MG tablet Take 1 tablet (800 mg total) by mouth every 8 (eight) hours as needed for moderate pain (with food).   levocetirizine (XYZAL  ALLERGY 24HR) 5 MG tablet Take 1 tablet (5 mg total) by mouth every evening.   metoprolol  succinate (TOPROL -XL) 50 MG 24 hr tablet Take 1 tablet by mouth once daily   sertraline  (ZOLOFT ) 100 MG tablet Take 1 tablet (100 mg total) by mouth daily.   EPINEPHrine  0.3 mg/0.3 mL IJ SOAJ injection Inject 0.3 mg into the muscle as needed for anaphylaxis. (Patient not taking: Reported on 02/13/2024)   No facility-administered encounter medications on file as of 02/13/2024.    Allergies (verified) Cortisone, Erythromycin, Neurontin [gabapentin], Crestor  [rosuvastatin ], Egg-derived products, Iodinated contrast media, Prednisone, Flexeril [cyclobenzaprine],  and Naproxen   History: Past Medical History:  Diagnosis Date   Cervical spondylosis    Chronic neck pain 01/14/2020   Evaluated by ortho and NS: offered surgery but not clearly going to be helpful. Pt defers.    Chronic pain    Depression    Heart murmur    History of posterior vitreous detachment 01/03/2018   Hyperlipidemia    Hypertension    Mild intermittent asthma 01/14/2020    Tremors of nervous system    History reviewed. No pertinent surgical history. Family History  Problem Relation Age of Onset   Pulmonary embolism Mother    Hypertension Father    Heart disease Father    Lung disease Sister    Syncope episode Sister    Lung disease Brother    Heart disease Brother    Cancer - Lung Brother    Social History   Socioeconomic History   Marital status: Legally Separated    Spouse name: Not on file   Number of children: 2   Years of education: AAS   Highest education level: Associate degree: academic program  Occupational History   Occupation: retired  Tobacco Use   Smoking status: Former    Current packs/day: 0.00    Types: Cigarettes    Quit date: 06/12/2005    Years since quitting: 18.6   Smokeless tobacco: Never  Substance and Sexual Activity   Alcohol use: No   Drug use: No   Sexual activity: Not on file  Other Topics Concern   Not on file  Social History Narrative   Lives at home w/ her daughter   Patient drinks 1 cup of coffee daily.   Patient is left handed.    Social Drivers of Corporate investment banker Strain: Low Risk  (02/09/2024)   Overall Financial Resource Strain (CARDIA)    Difficulty of Paying Living Expenses: Not hard at all  Food Insecurity: No Food Insecurity (02/09/2024)   Hunger Vital Sign    Worried About Running Out of Food in the Last Year: Never true    Ran Out of Food in the Last Year: Never true  Transportation Needs: No Transportation Needs (02/09/2024)   PRAPARE - Administrator, Civil Service (Medical): No    Lack of Transportation (Non-Medical): No  Physical Activity: Insufficiently Active (02/09/2024)   Exercise Vital Sign    Days of Exercise per Week: 1 day    Minutes of Exercise per Session: 20 min  Stress: Stress Concern Present (02/09/2024)   [employer] of Occupational Health - Occupational Stress Questionnaire    Feeling of Stress: To some extent  Social Connections: Socially  Isolated (02/09/2024)   Social Connection and Isolation Panel    Frequency of Communication with Friends and Family: More than three times a week    Frequency of Social Gatherings with Friends and Family: Twice a week    Attends Religious Services: Never    Database administrator or Organizations: No    Attends Engineer, structural: Not on file    Marital Status: Separated    Tobacco Counseling Counseling given: Not Answered    Clinical Intake:  Pre-visit preparation completed: Yes  Pain : 0-10 Pain Score: 8  Pain Type: Chronic pain Pain Location: Arm Pain Orientation: Right Pain Descriptors / Indicators: Aching Pain Onset: More than a month ago Pain Frequency: Constant     BMI - recorded: 35.3 Nutritional Status: BMI > 30  Obese Nutritional  Risks: None Diabetes: No  Lab Results  Component Value Date   HGBA1C 5.5 06/14/2021   HGBA1C 5.1 05/08/2017     How often do you need to have someone help you when you read instructions, pamphlets, or other written materials from your doctor or pharmacy?: 1 - Never  Interpreter Needed?: No  Information entered by :: Ellouise Haws, LPN   Activities of Daily Living     02/09/2024    5:58 PM 09/06/2023    8:28 PM  In your present state of health, do you have any difficulty performing the following activities:  Hearing? 0 0  Vision? 0 0  Difficulty concentrating or making decisions? 0 0  Walking or climbing stairs? 0   Dressing or bathing? 1   Comment right arm   Doing errands, shopping? 0   Preparing Food and eating ? Y   Comment right arm   Using the Toilet? N   In the past six months, have you accidently leaked urine? N   Do you have problems with loss of bowel control? N   Managing your Medications? N   Managing your Finances? N   Housekeeping or managing your Housekeeping? N     Patient Care Team: Jodie Lavern CROME, MD as PCP - General (Family Medicine) Barbarann Oneil BROCKS, MD (Inactive) as Consulting  Physician (Orthopedic Surgery) Gillie Duncans, MD as Consulting Physician (Neurosurgery) Lelon Lonni BIRCH, MD as Consulting Physician (Surgery) Margaret Eduard SAUNDERS, MD as Consulting Physician (Neurology) Dianna Specking, MD as Consulting Physician (Gastroenterology)  I have updated your Care Teams any recent Medical Services you may have received from other providers in the past year.     Assessment:   This is a routine wellness examination for [facility].  Hearing/Vision screen Hearing Screening - Comments:: Pt denies any hearing issues  Vision Screening - Comments:: Was following up with Dr Cleatus will need to follow up with a new provider    Goals Addressed             This Visit's Progress    to manage the arm pain         Depression Screen     02/13/2024    3:38 PM 09/05/2023    1:15 PM 02/21/2023    1:22 PM 08/23/2022    1:14 PM 08/15/2022   11:40 AM 07/19/2022   10:09 AM 03/22/2022   10:43 AM  PHQ 2/9 Scores  PHQ - 2 Score 0 0 0 0 4 4 0  PHQ- 9 Score    0 16 16     Fall Risk     02/09/2024    5:58 PM 09/05/2023    1:14 PM 02/21/2023    1:22 PM 08/23/2022    1:14 PM 08/15/2022   11:45 AM  Fall Risk   Falls in the past year? 0 0 0 0 0  Number falls in past yr: 0 0 0 0 0  Injury with Fall? 0 0 0 0 0  Risk for fall due to : No Fall Risks No Fall Risks No Fall Risks No Fall Risks Impaired vision;Impaired balance/gait;Impaired mobility  Follow up Falls prevention discussed Falls evaluation completed Falls evaluation completed Falls evaluation completed Falls prevention discussed    MEDICARE RISK AT HOME:  Medicare Risk at Home Any stairs in or around the home?: (Patient-Rptd) Yes If so, are there any without handrails?: (Patient-Rptd) No Home free of loose throw rugs in walkways, pet beds, electrical cords, etc?: (Patient-Rptd) Yes Adequate  lighting in your home to reduce risk of falls?: (Patient-Rptd) Yes Life alert?: (Patient-Rptd) No Use of a cane, walker or  w/c?: (Patient-Rptd) Yes Grab bars in the bathroom?: (Patient-Rptd) Yes Shower chair or bench in shower?: (Patient-Rptd) Yes Elevated toilet seat or a handicapped toilet?: (Patient-Rptd) Yes  TIMED UP AND GO:  Was the test performed?  No  Cognitive Function: 6CIT completed        02/13/2024    3:42 PM 08/15/2022   11:46 AM 07/29/2021    2:29 PM 04/02/2019    1:40 PM  6CIT Screen  What Year? 0 points 0 points 0 points 0 points  What month? 0 points 0 points 0 points 0 points  What time? 0 points 0 points 0 points 0 points  Count back from 20 0 points 0 points 0 points 0 points  Months in reverse 0 points 0 points 0 points 0 points  Repeat phrase 0 points 0 points 4 points 2 points  Total Score 0 points 0 points 4 points 2 points    Immunizations Immunization History  Administered Date(s) Administered   PFIZER Comirnaty(Gray Top)Covid-19 Tri-Sucrose Vaccine 08/18/2020   PFIZER(Purple Top)SARS-COV-2 Vaccination 10/15/2019, 11/05/2019   PNEUMOCOCCAL CONJUGATE-20 06/08/2021    Screening Tests Health Maintenance  Topic Date Due   DTaP/Tdap/Td (1 - Tdap) Never done   MAMMOGRAM  10/10/2021   Colonoscopy  11/11/2023   COVID-19 Vaccine (4 - 2025-26 season) 02/11/2024   Medicare Annual Wellness (AWV)  02/12/2025   DEXA SCAN  04/27/2025   Pneumococcal Vaccine: 50+ Years  Completed   Hepatitis C Screening  Completed   HPV VACCINES  Aged Out   Meningococcal B Vaccine  Aged Out   INFLUENZA VACCINE  Discontinued   Zoster Vaccines- Shingrix   Discontinued    Health Maintenance  Health Maintenance Due  Topic Date Due   DTaP/Tdap/Td (1 - Tdap) Never done   MAMMOGRAM  10/10/2021   Colonoscopy  11/11/2023   COVID-19 Vaccine (4 - 2025-26 season) 02/11/2024   Health Maintenance Items Addressed: Mammogram ordered, Referral sent to GI for colonoscopy  Additional Screening:  Vision Screening: Recommended annual ophthalmology exams for early detection of glaucoma and other disorders  of the eye. Would you like a referral to an eye doctor? No    Dental Screening: Recommended annual dental exams for proper oral hygiene  Community Resource Referral / Chronic Care Management: CRR required this visit?  No   CCM required this visit?  No   Plan:    I have personally reviewed and noted the following in the patient's chart:   Medical and social history Use of alcohol, tobacco or illicit drugs  Current medications and supplements including opioid prescriptions. Patient is not currently taking opioid prescriptions. Functional ability and status Nutritional status Physical activity Advanced directives List of other physicians Hospitalizations, surgeries, and ER visits in previous 12 months Vitals Screenings to include cognitive, depression, and falls Referrals and appointments  In addition, I have reviewed and discussed with patient certain preventive protocols, quality metrics, and best practice recommendations. A written personalized care plan for preventive services as well as general preventive health recommendations were provided to patient.   Ellouise VEAR Haws, LPN   [id number]   After Visit Summary: (MyChart) Due to this being a telephonic visit, the after visit summary with patients personalized plan was offered to patient via MyChart   Notes: PCP Follow Up Recommendations: Pt is requesting to have mammogram completed at [facility]

## 2024-02-13 NOTE — Patient Instructions (Signed)
 Ms. Brooke Coleman , Thank you for taking time out of your busy schedule to complete your Annual Wellness Visit with me. I enjoyed our conversation and look forward to speaking with you again next year. I, as well as your care team,  appreciate your ongoing commitment to your health goals. Please review the following plan we discussed and let me know if I can assist you in the future. Your Game plan/ To Do List    Referrals: If you haven't heard from the office you've been referred to, please reach out to them at the phone provided.   Follow up Visits: We will see or speak with you next year for your Next Medicare AWV with our clinical staff Have you seen your provider in the last 6 months (3 months if uncontrolled diabetes)? Yes  Clinician Recommendations:  Aim for 30 minutes of exercise or brisk walking, 6-8 glasses of water, and 5 servings of fruits and vegetables each day.        This is a list of the screenings recommended for you:  Health Maintenance  Topic Date Due   DTaP/Tdap/Td vaccine (1 - Tdap) Never done   Mammogram  10/10/2021   Medicare Annual Wellness Visit  08/15/2023   Colon Cancer Screening  11/11/2023   COVID-19 Vaccine (4 - 2025-26 season) 02/11/2024   DEXA scan (bone density measurement)  04/27/2025   Pneumococcal Vaccine for age over 65  Completed   Hepatitis C Screening  Completed   HPV Vaccine  Aged Out   Meningitis B Vaccine  Aged Out   Flu Shot  Discontinued   Zoster (Shingles) Vaccine  Discontinued    Advanced directives: (Declined) Advance directive discussed with you today. Even though you declined this today, please call our office should you change your mind, and we can give you the proper paperwork for you to fill out. Advance Care Planning is important because it:  [x]  Makes sure you receive the medical care that is consistent with your values, goals, and preferences  [x]  It provides guidance to your family and loved ones and reduces their decisional  burden about whether or not they are making the right decisions based on your wishes.  Follow the link provided in your after visit summary or read over the paperwork we have mailed to you to help you started getting your Advance Directives in place. If you need assistance in completing these, please reach out to us  so that we can help you!  See attachments for Preventive Care and Fall Prevention Tips.

## 2024-02-15 ENCOUNTER — Ambulatory Visit: Admitting: Diagnostic Neuroimaging

## 2024-03-11 ENCOUNTER — Ambulatory Visit: Admitting: Family Medicine

## 2024-03-11 ENCOUNTER — Encounter: Payer: Self-pay | Admitting: Family Medicine

## 2024-03-11 VITALS — BP 152/81 | HR 65 | Temp 98.2°F | Ht 70.0 in | Wt 244.2 lb

## 2024-03-11 DIAGNOSIS — M47812 Spondylosis without myelopathy or radiculopathy, cervical region: Secondary | ICD-10-CM

## 2024-03-11 DIAGNOSIS — G8929 Other chronic pain: Secondary | ICD-10-CM | POA: Diagnosis not present

## 2024-03-11 DIAGNOSIS — G20B1 Parkinson's disease with dyskinesia, without mention of fluctuations: Secondary | ICD-10-CM

## 2024-03-11 DIAGNOSIS — M542 Cervicalgia: Secondary | ICD-10-CM | POA: Diagnosis not present

## 2024-03-11 DIAGNOSIS — K5732 Diverticulitis of large intestine without perforation or abscess without bleeding: Secondary | ICD-10-CM

## 2024-03-11 DIAGNOSIS — E782 Mixed hyperlipidemia: Secondary | ICD-10-CM

## 2024-03-11 DIAGNOSIS — I10 Essential (primary) hypertension: Secondary | ICD-10-CM

## 2024-03-11 DIAGNOSIS — Z1211 Encounter for screening for malignant neoplasm of colon: Secondary | ICD-10-CM

## 2024-03-11 DIAGNOSIS — F341 Dysthymic disorder: Secondary | ICD-10-CM | POA: Diagnosis not present

## 2024-03-11 DIAGNOSIS — Z780 Asymptomatic menopausal state: Secondary | ICD-10-CM

## 2024-03-11 LAB — CBC WITH DIFFERENTIAL/PLATELET
Basophils Absolute: 0.1 K/uL (ref 0.0–0.1)
Basophils Relative: 1.2 % (ref 0.0–3.0)
Eosinophils Absolute: 0.2 K/uL (ref 0.0–0.7)
Eosinophils Relative: 5.2 % — ABNORMAL HIGH (ref 0.0–5.0)
HCT: 43.5 % (ref 36.0–46.0)
Hemoglobin: 14.7 g/dL (ref 12.0–15.0)
Lymphocytes Relative: 34 % (ref 12.0–46.0)
Lymphs Abs: 1.6 K/uL (ref 0.7–4.0)
MCHC: 33.8 g/dL (ref 30.0–36.0)
MCV: 93.3 fl (ref 78.0–100.0)
Monocytes Absolute: 0.3 K/uL (ref 0.1–1.0)
Monocytes Relative: 7.1 % (ref 3.0–12.0)
Neutro Abs: 2.5 K/uL (ref 1.4–7.7)
Neutrophils Relative %: 52.5 % (ref 43.0–77.0)
Platelets: 265 K/uL (ref 150.0–400.0)
RBC: 4.66 Mil/uL (ref 3.87–5.11)
RDW: 14 % (ref 11.5–15.5)
WBC: 4.7 K/uL (ref 4.0–10.5)

## 2024-03-11 LAB — COMPREHENSIVE METABOLIC PANEL WITH GFR
ALT: 10 U/L (ref 0–35)
AST: 17 U/L (ref 0–37)
Albumin: 4.4 g/dL (ref 3.5–5.2)
Alkaline Phosphatase: 82 U/L (ref 39–117)
BUN: 8 mg/dL (ref 6–23)
CO2: 29 meq/L (ref 19–32)
Calcium: 9.7 mg/dL (ref 8.4–10.5)
Chloride: 102 meq/L (ref 96–112)
Creatinine, Ser: 0.78 mg/dL (ref 0.40–1.20)
GFR: 76.66 mL/min (ref >60.00)
Glucose, Bld: 92 mg/dL (ref 70–99)
Potassium: 3.7 meq/L (ref 3.5–5.1)
Sodium: 138 meq/L (ref 135–145)
Total Bilirubin: 1 mg/dL (ref 0.2–1.2)
Total Protein: 8.1 g/dL (ref 6.0–8.3)

## 2024-03-11 MED ORDER — METOPROLOL SUCCINATE ER 50 MG PO TB24: 50.0000 mg | ORAL_TABLET | Freq: Every day | ORAL | 3 refills | Status: AC

## 2024-03-11 MED ORDER — SIMVASTATIN 20 MG PO TABS: 20.0000 mg | ORAL_TABLET | Freq: Every day | ORAL | 3 refills | Status: AC

## 2024-03-11 MED ORDER — KETOROLAC TROMETHAMINE 60 MG/2ML IM SOLN
60.0000 mg | Freq: Once | INTRAMUSCULAR | Status: AC
  Administered 2024-03-11: 60 mg via INTRAMUSCULAR

## 2024-03-11 MED ORDER — TRAMADOL HCL 50 MG PO TABS: 50.0000 mg | ORAL_TABLET | Freq: Two times a day (BID) | ORAL | 2 refills | Status: AC | PRN

## 2024-03-11 NOTE — Progress Notes (Signed)
 Subjective  CC:  Chief Complaint  Patient presents with   Hypertension    HPI: Brooke Coleman is a 73 y.o. female who presents to the office today for chronic problem follow up. Hypertension f/u:  Discussed the use of AI scribe software for clinical note transcription with the patient, who gave verbal consent to proceed.  History of Present Illness Brooke Coleman is a 73 year old female with a history of herniated disc and arthritis who presents with severe arm and neck pain.  Cervical radiculopathy and musculoskeletal pain - Severe pain radiating from the arm to the ear, described as 'agony' - Significant impact on daily activities, including difficulty getting up to use the bathroom - History of herniated cervical disc and arthritis - Pain worsens with rainy weather - Tylenol  Arthritis provides some relief - Allergic to prednisone - Has been evaluated by neurosurgery in the past.  Has declined surgery.  He has used gabapentin in the past, reportedly had swelling with this and did not tolerated. - Denies weakness of the upper extremity.  No fevers or chills.  Parkinson's disease with resting tremor - Tremor present for an extended period - Seeing neurology.  Sinemet  started.  She does feel that it helps  Chronic dysthymia: Has been on sertraline  for several decades but stopped within the last year.  She thought it may have been linked to her tremor.  She denies mood problems currently.  Hypertension Has been well-controlled however today she is in significant pain so blood pressure is likely due to a pain response.-  Reports good compliance with metoprolol  and amlodipine .  Hyperlipidemia: Did not tolerate Crestor  due to myalgias.  Would be willing to try a different statin.  History of diverticulitis: Reviewed last hospitalization.  Fortunately improved Overdue for colorectal cancer screening.  Now preferring colonoscopy. Health maintenance: Overdue for mammogram and bone  density.   Assessment  1. Essential hypertension   2. Mixed hyperlipidemia   3. Parkinson's disease with dyskinesia, unspecified whether manifestations fluctuate (HCC)   4. Diverticulitis of large intestine without perforation or abscess without bleeding   5. Dysthymia   6. Chronic neck pain   7. Cervical spondylosis without myelopathy   8. Screening for colorectal cancer   9. Asymptomatic menopausal state      Plan  Assessment and Plan Assessment & Plan Cervical radiculopathy due to cervical spondylosis/arthritis Chronic severe pain from cervical spondylosis with nerve impingement. Previous imaging indicated arthritis. Pain management prioritized due to exacerbation by movement and weather changes. Allergic to prednisone. - Administer Toradol 60 mg IM for acute pain relief. - Prescribe pain medication for ongoing management.  Tramadol 50 twice daily as needed  Dysthymia Discontinued long-term sertraline  use. Mood significantly impacted by chronic pain. No current antidepressant therapy.  Monitor, mood currently well-controlled  Parkinson's disease with dyskinesia/tremor Follow-up with neuro scheduled.  Continue Sinemet   Hypertension with pain response today.  Has been well-controlled.  Patient to check home readings.  Continue metoprolol  and amlodipine .  Check renal function and electrolytes.  Hyperlipidemia: Trial of simvastatin initiated.  Diverticulitis: Resolved  General Health Maintenance Incomplete recommended screenings. Willing to undergo procedures if referred. - Refer to gastroenterology for colonoscopy. - Provide contact information for mammogram scheduling.  Patient to schedule - Schedule bone density test. - Declines immunizations   Education regarding management of these chronic disease states was given. Management strategies discussed on successive visits include dietary and exercise recommendations, goals of achieving and maintaining IBW, and lifestyle  modifications aiming  for adequate sleep and minimizing stressors.  Follow up: 6 months for complete physical  Orders Placed This Encounter  Procedures   DG Bone Density   CBC with Differential/Platelet   Comprehensive metabolic panel with GFR   Ambulatory referral to Gastroenterology   Meds ordered this encounter  Medications   metoprolol  succinate (TOPROL -XL) 50 MG 24 hr tablet    Sig: Take 1 tablet (50 mg total) by mouth daily.    Dispense:  90 tablet    Refill:  3   traMADol (ULTRAM) 50 MG tablet    Sig: Take 1 tablet (50 mg total) by mouth 2 (two) times daily as needed for moderate pain (pain score 4-6).    Dispense:  60 tablet    Refill:  2   simvastatin (ZOCOR) 20 MG tablet    Sig: Take 1 tablet (20 mg total) by mouth at bedtime.    Dispense:  90 tablet    Refill:  3   ketorolac (TORADOL) injection 60 mg      BP Readings from Last 3 Encounters:  03/11/24 (!) 152/81  09/11/23 (!) 134/92  09/05/23 138/74   Wt Readings from Last 3 Encounters:  03/11/24 244 lb 3.2 oz (110.8 kg)  02/13/24 246 lb (111.6 kg)  09/06/23 250 lb 7.1 oz (113.6 kg)    Lab Results  Component Value Date   CHOL 180 09/05/2023   CHOL 237 (H) 08/23/2022   CHOL 206 (H) 06/14/2021   Lab Results  Component Value Date   HDL 41.60 09/05/2023   HDL 47.70 08/23/2022   HDL 46.80 06/14/2021   Lab Results  Component Value Date   LDLCALC 116 (H) 09/05/2023   LDLCALC 156 (H) 08/23/2022   LDLCALC 131 (H) 06/14/2021   Lab Results  Component Value Date   TRIG 112.0 09/05/2023   TRIG 164.0 (H) 08/23/2022   TRIG 139.0 06/14/2021   Lab Results  Component Value Date   CHOLHDL 4 09/05/2023   CHOLHDL 5 08/23/2022   CHOLHDL 4 06/14/2021   No results found for: LDLDIRECT Lab Results  Component Value Date   CREATININE 0.63 09/11/2023   BUN <5 (L) 09/11/2023   NA 140 09/11/2023   K 3.7 09/11/2023   CL 107 09/11/2023   CO2 26 09/11/2023    The 10-year ASCVD risk score (Arnett DK, et al.,  2019) is: 14.6%   Values used to calculate the score:     Age: 57 years     Clincally relevant sex: Female     Is Non-Hispanic African American: Yes     Diabetic: No     Tobacco smoker: No     Systolic Blood Pressure: 152 mmHg     Is BP treated: Yes     HDL Cholesterol: 41.6 mg/dL     Total Cholesterol: 180 mg/dL  I reviewed the patients updated PMH, FH, and SocHx.    Patient Active Problem List   Diagnosis Date Noted   Diverticulitis of large intestine without perforation or abscess without bleeding 09/06/2023    Priority: High   Mixed hyperlipidemia 09/05/2023    Priority: High   Parkinson disease (HCC) 09/21/2022    Priority: High   Chronic neck pain 01/14/2020    Priority: High   Chronic insomnia 01/24/2017    Priority: High   Essential hypertension 11/07/2016    Priority: High   Dysthymia 11/07/2016    Priority: High   Cervical spondylosis without myelopathy 06/04/2015    Priority: High  Primary osteoarthritis of both hands 01/14/2020    Priority: Medium    Primary osteoarthritis of both wrists 01/14/2020    Priority: Medium    Mild intermittent asthma 01/14/2020    Priority: Medium    Gastroesophageal reflux disease without esophagitis 11/07/2016    Priority: Medium    Bilateral carpal tunnel syndrome 06/04/2015    Priority: Medium    Vitamin D  deficiency 01/14/2020    Priority: Low   Vitamin B12 deficiency 01/14/2020    Priority: Low   Drusen of macula, bilateral 01/03/2018    Priority: Low   C. difficile colitis 09/07/2023   History of anaphylaxis 06/08/2021    Allergies: Cortisone, Erythromycin, Neurontin [gabapentin], Crestor  [rosuvastatin ], Egg-derived products, Iodinated contrast media, Prednisone, Flexeril [cyclobenzaprine], and Naproxen  Social History: Patient  reports that she quit smoking about 18 years ago. Her smoking use included cigarettes. She has never used smokeless tobacco. She reports that she does not drink alcohol and does not use  drugs.  Current Meds  Medication Sig   albuterol  (VENTOLIN  HFA) 108 (90 Base) MCG/ACT inhaler Inhale 2 puffs into the lungs every 6 (six) hours as needed for wheezing or shortness of breath.   amLODipine  (NORVASC ) 10 MG tablet Take 1 tablet (10 mg total) by mouth daily.   aspirin 81 MG tablet Take 81 mg by mouth daily.   carbidopa -levodopa  (SINEMET  IR) 25-100 MG tablet TAKE 1 TABLET BY MOUTH THREE TIMES DAILY BEFORE MEAL(S)   EPINEPHrine  0.3 mg/0.3 mL IJ SOAJ injection Inject 0.3 mg into the muscle as needed for anaphylaxis.   ibuprofen  (ADVIL ) 800 MG tablet Take 1 tablet (800 mg total) by mouth every 8 (eight) hours as needed for moderate pain (with food).   levocetirizine (XYZAL  ALLERGY 24HR) 5 MG tablet Take 1 tablet (5 mg total) by mouth every evening.   simvastatin (ZOCOR) 20 MG tablet Take 1 tablet (20 mg total) by mouth at bedtime.   traMADol (ULTRAM) 50 MG tablet Take 1 tablet (50 mg total) by mouth 2 (two) times daily as needed for moderate pain (pain score 4-6).   [DISCONTINUED] amitriptyline  (ELAVIL ) 25 MG tablet Take 1 tablet (25 mg total) by mouth at bedtime.   [DISCONTINUED] metoprolol  succinate (TOPROL -XL) 50 MG 24 hr tablet Take 1 tablet by mouth once daily   [DISCONTINUED] sertraline  (ZOLOFT ) 100 MG tablet Take 1 tablet (100 mg total) by mouth daily.    Review of Systems: Cardiovascular: negative for chest pain, palpitations, leg swelling, orthopnea Respiratory: negative for SOB, wheezing or persistent cough Gastrointestinal: negative for abdominal pain Genitourinary: negative for dysuria or gross hematuria  Objective  Vitals: BP (!) 152/81   Pulse 65   Temp 98.2 F (36.8 C)   Ht 5' 10 (1.778 m)   Wt 244 lb 3.2 oz (110.8 kg)   SpO2 100%   BMI 35.04 kg/m  General: no acute distress  Psych:  Alert and oriented, normal mood and affect, happy today HEENT:  Normocephalic, atraumatic, supple neck  Cardiovascular:  RRR positive murmur. no edema Respiratory:  Good  breath sounds bilaterally, CTAB with normal respiratory effort Skin:  Warm, no rashes Neurologic:   Resting tremor bilateral upper extremities Commons side effects, risks, benefits, and alternatives for medications and treatment plan prescribed today were discussed, and the patient expressed understanding of the given instructions. Patient is instructed to call or message via MyChart if he/she has any questions or concerns regarding our treatment plan. No barriers to understanding were identified. We discussed Red Flag  symptoms and signs in detail. Patient expressed understanding regarding what to do in case of urgent or emergency type symptoms.  Medication list was reconciled, printed and provided to the patient in AVS. Patient instructions and summary information was reviewed with the patient as documented in the AVS. This note was prepared with assistance of Dragon voice recognition software. Occasional wrong-word or sound-a-like substitutions may have occurred due to the inherent limitation

## 2024-03-11 NOTE — Patient Instructions (Signed)
 Please return in 6 months for your annual complete physical; please come fasting.  For follow up on chronic medical conditions   I will release your lab results to you on your MyChart account with further instructions. You may see the results before I do, but when I review them I will send you a message with my report or have my assistant call you if things need to be discussed. Please reply to my message with any questions. Thank you!   Please call the office checked below to schedule your appointment for your mammogram and/or bone density screen (the checked studies were ordered): [x]   Mammogram  [x]   Bone Density  [x]   [facility]     [address] [place], [place]        [phone]         We will call you with information regarding your referral appointment. [facility] for colonoscopy.  If you do not hear from us  within the next 2 weeks, please let me know. It can take 1-2 weeks to get appointments set up with the specialists.    If you have any questions or concerns, please don't hesitate to send me a message via MyChart or call the office at [phone]. Thank you for visiting with us  today! It's our pleasure caring for you.   Please start the new cholesterol lowering medication nightly.  You may use tramadol as needed for neck and arm pain.

## 2024-03-12 ENCOUNTER — Ambulatory Visit: Admitting: Family Medicine

## 2024-03-16 ENCOUNTER — Ambulatory Visit: Payer: Self-pay | Admitting: Family Medicine

## 2024-03-16 NOTE — Progress Notes (Signed)
 See mychart note Dear Ms. Mardy, Your lab results look great! Good seeing you.  Sincerely, Dr. Jodie

## 2024-03-17 ENCOUNTER — Telehealth: Payer: Self-pay

## 2024-03-17 NOTE — Telephone Encounter (Signed)
 Copied from CRM [id number]. Topic: Clinical - Prescription Issue >> Mar 17, 2024 10:59 AM Brooke Coleman wrote: Reason for CRM: Patient called in statin  Insurance won't cover traMADol (ULTRAM) 50 MG tablet for 90 days ; last time she took simvastatin (ZOCOR) 20 MG tablet it pain in stomach was severe

## 2024-03-28 ENCOUNTER — Other Ambulatory Visit: Payer: Self-pay

## 2024-03-28 ENCOUNTER — Telehealth: Payer: Self-pay

## 2024-03-28 DIAGNOSIS — Z1231 Encounter for screening mammogram for malignant neoplasm of breast: Secondary | ICD-10-CM

## 2024-03-28 NOTE — Telephone Encounter (Signed)
 Copied from CRM [id number]. Topic: Referral - Request for Referral >> Mar 27, 2024  2:43 PM [name] wrote: Did the patient discuss referral with their provider in the last year? Yes (If No - schedule appointment) (If Yes - send message)  Appointment offered? No  Type of order/referral and detailed reason for visit: Mammogram  Preference of office, provider, location: [facility] at [address] [place], [place], [place] [address]. Fax Number: [phone].  If referral order, have you been seen by this specialty before? No (If Yes, this issue or another issue? When? Where?  Can we respond through MyChart? No

## 2024-03-31 ENCOUNTER — Other Ambulatory Visit: Payer: Self-pay | Admitting: Diagnostic Neuroimaging

## 2024-06-06 ENCOUNTER — Encounter: Payer: Self-pay | Admitting: Family Medicine

## 2024-07-15 ENCOUNTER — Telehealth: Payer: Self-pay | Admitting: Family Medicine

## 2024-08-19 ENCOUNTER — Ambulatory Visit: Admitting: Diagnostic Neuroimaging

## 2024-09-09 ENCOUNTER — Encounter: Admitting: Family Medicine

## 2025-02-17 ENCOUNTER — Ambulatory Visit
